# Patient Record
Sex: Male | Born: 1935
Health system: Southern US, Community
[De-identification: ages and names within clinical notes are randomized; demographics above are authoritative.]

## PROBLEM LIST (undated history)

## (undated) DIAGNOSIS — I639 Cerebral infarction, unspecified: Secondary | ICD-10-CM

## (undated) DIAGNOSIS — E7800 Pure hypercholesterolemia, unspecified: Secondary | ICD-10-CM

## (undated) DIAGNOSIS — M4800 Spinal stenosis, site unspecified: Secondary | ICD-10-CM

## (undated) DIAGNOSIS — R42 Dizziness and giddiness: Secondary | ICD-10-CM

## (undated) DIAGNOSIS — M1990 Unspecified osteoarthritis, unspecified site: Secondary | ICD-10-CM

## (undated) DIAGNOSIS — G459 Transient cerebral ischemic attack, unspecified: Secondary | ICD-10-CM

## (undated) DIAGNOSIS — N400 Enlarged prostate without lower urinary tract symptoms: Secondary | ICD-10-CM

## (undated) HISTORY — PX: KNEE SURGERY: SHX244

## (undated) HISTORY — DX: Dizziness and giddiness: R42

## (undated) HISTORY — DX: Pure hypercholesterolemia, unspecified: E78.00

## (undated) HISTORY — DX: Spinal stenosis, site unspecified: M48.00

## (undated) HISTORY — DX: Benign prostatic hyperplasia without lower urinary tract symptoms: N40.0

## (undated) HISTORY — PX: HERNIA REPAIR: SHX51

## (undated) HISTORY — DX: Cerebral infarction, unspecified: I63.9

## (undated) HISTORY — DX: Transient cerebral ischemic attack, unspecified: G45.9

## (undated) HISTORY — PX: SHOULDER SURGERY: SHX246

## (undated) HISTORY — DX: Unspecified osteoarthritis, unspecified site: M19.90

---

## 1998-10-23 ENCOUNTER — Emergency Department (HOSPITAL_COMMUNITY): Admission: EM | Admit: 1998-10-23 | Discharge: 1998-10-23 | Payer: Self-pay | Admitting: Emergency Medicine

## 2000-08-21 ENCOUNTER — Ambulatory Visit (HOSPITAL_COMMUNITY): Admission: RE | Admit: 2000-08-21 | Discharge: 2000-08-21 | Payer: Self-pay | Admitting: Gastroenterology

## 2002-06-01 ENCOUNTER — Encounter: Payer: Self-pay | Admitting: General Surgery

## 2002-06-01 ENCOUNTER — Ambulatory Visit (HOSPITAL_COMMUNITY): Admission: RE | Admit: 2002-06-01 | Discharge: 2002-06-01 | Payer: Self-pay | Admitting: General Surgery

## 2005-10-10 ENCOUNTER — Ambulatory Visit (HOSPITAL_COMMUNITY): Admission: RE | Admit: 2005-10-10 | Discharge: 2005-10-10 | Payer: Self-pay | Admitting: Orthopedic Surgery

## 2005-10-10 ENCOUNTER — Ambulatory Visit (HOSPITAL_BASED_OUTPATIENT_CLINIC_OR_DEPARTMENT_OTHER): Admission: RE | Admit: 2005-10-10 | Discharge: 2005-10-10 | Payer: Self-pay | Admitting: Orthopedic Surgery

## 2005-12-15 ENCOUNTER — Ambulatory Visit (HOSPITAL_COMMUNITY): Admission: RE | Admit: 2005-12-15 | Discharge: 2005-12-15 | Payer: Self-pay | Admitting: Chiropractic Medicine

## 2006-06-21 ENCOUNTER — Encounter: Admission: RE | Admit: 2006-06-21 | Discharge: 2006-06-21 | Payer: Self-pay | Admitting: Family Medicine

## 2006-10-22 ENCOUNTER — Encounter: Admission: RE | Admit: 2006-10-22 | Discharge: 2006-10-22 | Payer: Self-pay | Admitting: Surgery

## 2007-04-23 ENCOUNTER — Encounter (INDEPENDENT_AMBULATORY_CARE_PROVIDER_SITE_OTHER): Payer: Self-pay | Admitting: Gastroenterology

## 2007-04-23 ENCOUNTER — Ambulatory Visit (HOSPITAL_COMMUNITY): Admission: RE | Admit: 2007-04-23 | Discharge: 2007-04-23 | Payer: Self-pay | Admitting: Gastroenterology

## 2007-12-23 ENCOUNTER — Encounter: Admission: RE | Admit: 2007-12-23 | Discharge: 2007-12-23 | Payer: Self-pay | Admitting: Orthopedic Surgery

## 2007-12-26 ENCOUNTER — Ambulatory Visit: Payer: Self-pay | Admitting: Internal Medicine

## 2007-12-26 DIAGNOSIS — J180 Bronchopneumonia, unspecified organism: Secondary | ICD-10-CM | POA: Insufficient documentation

## 2007-12-26 DIAGNOSIS — Z87891 Personal history of nicotine dependence: Secondary | ICD-10-CM | POA: Insufficient documentation

## 2010-12-03 ENCOUNTER — Encounter: Payer: Self-pay | Admitting: Surgery

## 2010-12-14 NOTE — Assessment & Plan Note (Signed)
Summary: pna/pre-op clearance/abx: Avelox started   Visit Type:  Initial Consult Referred by:  Dr. Teressa Senter PCP:  Dr. Raquel Sarna  Chief Complaint:  Consult/follow-up pna/surgical clearance.  History of Present Illness: 64 y male.  On 12/21/2007 - developed cold that worsened by to worst by 12/24/2007. On 12/23/2007 was having routine pre-op cxr for hand surgery. This cxr reportedly showed "touch of pneumonia". So, on 12/24/2007 was noticed to be running "low temp" in Dr. Stark Jock office, was also having "worst cold". Apparently Dr. Teressa Senter heard "noise" in lung. Dr. Teressa Senter then called Dr. Marcelyn Bruins in pulmonary and was reportedly started avelox on 12/24/2007 evening. The next day, he says he felt "dramatically better" and since then progressively better. Today, went for 2 mile hike.   Currently, feels very good. HAs "residual" cold symptoms and "sinus tingling". Was debating whether to come to pulm office today. No fever. No hemoptysis. No cough. No edema. No sputum.      Updated Prior Medication List: AVELOX 400 MG  TABS (MOXIFLOXACIN HCL) 1 by mouth daily x 10 days  Current Allergies: No known allergies   Past Medical History:    Denies problmes    Cardiology workup for rt hands turning white duing mountain hiking - [employer]. Suspects is Raynaud's  Past Surgical History:    Knee surgery    Inguinal Hernia Repair 2003    Colonoscopy 2001 and 2008   Family History:    colon cancer in a sister  Social History:    Son Lives in [place]. Does lot of mountain hiking. Retired. Lives with spouse. Ex smoker. Occ etoh   Risk Factors:  Tobacco use:  quit    Year quit:  45 years ago    Pack-years:  7.5 pack    Comments:  1/2 pack for 15 years Alcohol use:  yes    Drinks per day:  0    Comments:  occassional   Review of Systems  The patient denies anorexia, fever, weight loss, vision loss, decreased hearing, hoarseness, chest pain, syncope, dyspnea on exhertion,  peripheral edema, prolonged cough, hemoptysis, abdominal pain, melena, hematochezia, severe indigestion/heartburn, hematuria, incontinence, genital sores, muscle weakness, suspicious skin lesions, transient blindness, difficulty walking, depression, unusual weight change, abnormal bleeding, enlarged lymph nodes, angioedema, breast masses, and testicular masses.         Healthy. At baseline rt hand turns white when he does mountain hiking.    Vital Signs:  Patient Profile:   77 Years Old Male Weight:      191.8 pounds O2 Sat:      97 % O2 treatment:    Room Air Temp:     97.6 degrees F oral Pulse rate:   71 / minute BP sitting:   110 / 80  (left arm)  Vitals Entered By: Michel Bickers CMA (December 26, 2007 9:21 AM)             Comments Pt states he doesn't think he needs to be here. Medications reviewed.     Physical Exam  General:     well developed, well nourished, in no acute distress Head:     normocephalic and atraumatic Eyes:     PERRLA/EOM intact; conjunctiva and sclera clear Ears:     TMs intact and clear with normal canals Nose:     no deformity, discharge, inflammation, or lesions Mouth:     no deformity or lesionsMelampatti Class II.   Neck:     no masses, thyromegaly, or  abnormal cervical nodes Chest Wall:     no deformities noted Lungs:     LL crackles +. Rest clear Heart:     regular rate and rhythm, S1, S2 without murmurs, rubs, gallops, or clicks Abdomen:     bowel sounds positive; abdomen soft and non-tender without masses, or organomegaly Msk:     no deformity or scoliosis noted with normal posture Pulses:     pulses normal Extremities:     no clubbing, cyanosis, edema, or deformity noted Neurologic:     CN II-XII grossly intact with normal reflexes, coordination, muscle strength and tone Skin:     intact without lesions or rashes Cervical Nodes:     no significant adenopathy Axillary Nodes:     no significant adenopathy Psych:     alert  and cooperative; normal mood and affect; normal attention span and concentration   CXR  Procedure date:  12/23/2007  Findings:      CXR 12/23/2007 - Personally reviewed -> LL ATx, Consolidation, obliteration of diaphragm   Clinical Data:  Pre-operative respiratory exam for orthopedic   surgery.  Some cough and congestion.   CHEST, TWO VIEWS:   Comparison:  06/21/06.   Findings:  The patient has density and volume loss in the left lower   lobe suggesting mild pneumonia.  No effusions.  Right lung is clear.    IMPRESSION:   Mild left base pneumonia.    Read By:  Thomasenia Sales,  M.D.   Released By:  Thomasenia Sales,  M.D.     Impression & Recommendations:  Problem # 1:  BRONCHOPNEUMONIA ORGANISM UNSPECIFIED (ICD-485) Assessment: New Ex smoker who is otherwise healthy. Developed symptoms of Community Acquired Pnemonia. CXR shows LL atlectasis. Exam has crackles on that side. Dramatically better on D3/10 Avelox. Feels near baseline. Given smoking hx will need to ensure resolution. Otherwise, suspect cancer.   PLAN Repeat CXR in 6 weeks to ensure clearance of pneumonia IF no clearance, need CT chest (esp in view of smoking history) Can have surgery for hand when clinically feeling better   His updated medication list for this problem includes:    Avelox 400 Mg Tabs (Moxifloxacin hcl) .Marland Kitchen... 1 by mouth daily x 10 days  Orders: Radiology Referral (Radiology) Consultation Level IV (16109)   Medications Added to Medication List This Visit: 1)  Avelox 400 Mg Tabs (Moxifloxacin hcl) .Marland Kitchen.. 1 by mouth daily x 10 days   Patient Instructions: 1)  1. Please complete 10 day course of avelox 2)  2. CXR 6 weeks 3)  3. Followup in 6 weeks after cxr 4)  4. You can have hand surgery after you are feeling well    ]

## 2011-03-27 NOTE — Op Note (Signed)
Brian Fitzpatrick, Brian Fitzpatrick                ACCOUNT NO.:  [id number]   MEDICAL RECORD NO.:  [id number]          PATIENT TYPE:  AMB   LOCATION:  ENDO                         FACILITY:  [facility]   PHYSICIAN:  Anselmo Rod, M.D.  DATE OF BIRTH:  05-Nov-1934   DATE OF PROCEDURE:  04/23/2007  DATE OF DISCHARGE:                               OPERATIVE REPORT   PROCEDURE PERFORMED:  Colonoscopy with snare polypectomy x1.   ENDOSCOPIST:  Anselmo Rod, M.D.   INSTRUMENT USED:  Pentax video colonoscope.   INDICATIONS FOR PROCEDURE:  A 77 year old white male with a family  history of colon cancer in a sister, undergoing screening colonoscopy to  rule out colonic polyps, masses, etc.   PREPROCEDURE PREPARATION:  Informed consent was procured from the  patient.  The patient was fasted for 8 hours prior to the procedure and  prepped with 32 Osmoprep pills the night prior to the procedure.  Risks  and benefits of the procedure including a 10% miss rate of cancer and  polyp were discussed with the patient as well.   PREPROCEDURE PHYSICAL:  VS:  The patient had stable vital signs.  NECK:  Supple.  CHEST:  Clear to auscultation.  HEART:  S1 and S2 regular.  ABDOMEN:  Soft with normal bowel sounds.   DESCRIPTION OF PROCEDURE:  The patient was placed in the left lateral  decubitus position and sedated with 75 mcg of Fentanyl and 7.5 mg of  Versed given intravenously in slow incremental doses.  Once the patient  was adequately sedated and maintained on low-flow oxygen and continuous  cardiac monitoring, the Pentax video colonoscope was advanced from the  rectum to the cecum.  A large amount of adherent stool was noted in the  right colon.  In spite of multiple washes, the entire colonic mucosa on  the right side could not be clearly visualized.  The cecal base was  visualized along with the appendiceal orifice, but in spite of multiple  washes the underlying mucosa could not be clearly seen and  inspected.  The terminal ileum appeared healthy and without lesions.  A small  sessile polyp was removed by hot snare from the proximal right colon.  There was no evidence of diverticulosis.  Prominent internal hemorrhoids  were seen on retroflexion.  The transverse colon and left colon appeared  normal.  The patient tolerated the procedure well without immediate  complications.   IMPRESSION:  1. Small internal hemorrhoids seen on retroflexion.  2. Small sessile polyp removed by hot snare from proximal right colon.  3. No evidence of diverticulosis.  4. Large amount of adherent stool in the right colon and cecum.      Complete visualization of this area was not possible.  5. Normal terminal ileum.   RECOMMENDATIONS:  1. Await pathology results.  2. Avoid all nonsteroidals including aspirin for the next two weeks.  3. Repeat colonoscopy within the next year or earlier depending on      when we can get an authorization from the patient's insurance      company.  4.  NuLYTELY prep will be used the next time as the Osmoprep was not      completely successful in cleaning the colon.  5. Outpatient follow-up as need arises in the future.      Anselmo Rod, M.D.  Electronically Signed     JNM/MEDQ  D:  04/23/2007  T:  04/23/2007  Job:  [id number]   cc:   Alexia Freestone, M.D.  Fax: [phone]

## 2011-03-30 NOTE — Op Note (Signed)
[facility]  Patient:    Brian Fitzpatrick, Brian Fitzpatrick Visit Number: [id number] MRN: [id number]          Service Type: DSU Location: DAY Attending Physician:  Delsa Bern Dictated by:   Lorne Skeens. Hoxworth, M.D. Proc. Date: 06/01/02 Admit Date:  06/01/2002 Discharge Date: 06/01/2002                             Operative Report  PREOPERATIVE DIAGNOSIS:  Right inguinal hernia.  POSTOPERATIVE DIAGNOSIS:  Right inguinal hernia.  PROCEDURE:  Laparoscopic repair of right inguinal hernia.  SURGEON:  Lorne Skeens. Hoxworth, M.D.  ANESTHESIA:  General.  BRIEF HISTORY:  Brian Fitzpatrick is a 77 year old male who presents with a several month history of episodic inguinal pain and on examination has a reducible right inguinal hernia. Options for repair including open and laparoscopic repair were discussed with the patient and he has elected to proceed with laparoscopic repair of his right inguinal hernia. The nature of the procedure, its indications, risks of bleeding, infection and recurrence were discussed and understood preoperatively. He is now brought to the operating room for this procedure.  DESCRIPTION OF PROCEDURE:  The patient brought to the operating room, placed in supine position on the operating table and general endotracheal anesthesia was induced. He received preoperative antibiotics. A Foley catheter was placed. The abdomen, groin and genitalia were sterilely prepped and draped. Local anesthesia was used to infiltrate the trocar sites. A 1 cm incision was made at the umbilicus and dissection carried down to the fascia.  A transverse incision was made at the edge of the right rectus muscle and the medial edge of the muscle identified, retracted laterally and preperitoneal space entered. The balloon dissector was placed with its tip in the space and then carefully passed along the midline to the pubic symphysis and inflated. There was very good  deployment of the balloon on the right and fair deployment of the balloon on the left. It was left in place for several minutes and deflated and the structural balloon trocar placed and CO2 pressure applied. There was excellent dissection of the right preperitoneal space. The pubic symphysis was identified and pubic tubercle cleared down to the iliac vessels which were carefully protected. Working laterally, there appeared to be a moderate sized direct defect. The peritoneum was identified laterally and had been well dissected off the anterior abdominal wall and was further dissected posteriorly. The peritoneal edge was followed back medially and there was a projection of peritoneum up along the cord structures into the internal ring which was dissected away from cord structures and stripped posteriorly. This confirmed that the peritoneum was dissected well posteriorly across and the cord structures well skeletonized. The epigastric vessels were identified and carefully protected. After complete dissection, a large right sided bard 3D mesh was used and was placed in the preperitoneal space unfurled and oriented. It was tacked initially to the pubic symphysis and then along the Coopers ligament down to but avoiding the iliac vessels. It was then tacked laterally near the anterosuperior iliac spine placing all tacks so they could be felt through the anterior abdominal wall working lateral to medially avoiding the epigastric vessels. It appeared to provide nice broad coverage of the direct and indirect spaces. Holding the posterolateral corner in place under direct vision, all CO2 was evacuated and trocars removed. The fascial defect at the umbilicus was closed with a figure-of-eight suture of #0 Vicryl.  The skin incisions were closed with interrupted subcuticular 4-0 Monocryl and Steri-Strips. Sponge, needle and instrument counts were correct. A dry sterile dressing was applied, the patient  taken to the recovery room in good condition. Dictated by:   Lorne Skeens. Hoxworth, M.D. Attending Physician:  Delsa Bern DD:  06/01/02 TD:  06/05/02 Job: [id number]

## 2011-03-30 NOTE — Procedures (Signed)
[facility]  Patient:    Brian Fitzpatrick, Brian Fitzpatrick                       MRN: [id number] Proc. Date: 08/21/00 Adm. Date:  [id number] Disc. Date: [id number] Attending:  Charna [name] CC:         Reuben Likes, M.D.   Procedure Report  DATE OF BIRTH:  [birth year]  PROCEDURE:  Colonoscopy.  ENDOSCOPIST:  Anselmo Rod, M.D.  INSTRUMENTS USED:  Olympus video colonoscope.  INDICATIONS:  Guaiac positive stools in a 77 year old white male.  Rule out colonic polyps, masses, hemorrhoids, etc.  PREPROCEDURE PREPARATION:  Informed consent was procured from the patient. The patient was fasted for eight hours prior to the procedure and prepped with a bottle of magnesium citrate and a gallon of NuLytely the night prior to the procedure.  PREPROCEDURE PHYSICAL EXAMINATION:  VITAL SIGNS:  The patient had stable vital signs.  NECK:  Supple.  CHEST:  Clear to auscultation, S1, S2 regular.  ABDOMEN:  Soft with normal abdominal bowel sounds.  DESCRIPTION OF PROCEDURE:  The patient was placed in the left lateral decubitus position and sedated with 50 mg of Demerol and 6 mg of Versed intravenously.  Once the patient was adequately sedated and maintained on low-flow oxygen and continuous cardiac monitoring, the Olympus video colonoscope was advanced from the rectum to the cecum without difficulty. Except for a small internal hemorrhoid, no other abnormalities were seen.  The entire colonic mucosa appeared healthy.  There was no evidence of masses, polyps, erosions, or ulcerations.  IMPRESSION:  Normal colonoscopy except for one small internal hemorrhoid.  RECOMMENDATIONS:  Repeat guaiac testing will be done on an outpatient basis and further recommendations made as needed. DD:  08/21/00 TD:  08/22/00 Job: [id number]

## 2011-03-30 NOTE — Op Note (Signed)
Brian Fitzpatrick, Brian Fitzpatrick                ACCOUNT NO.:  [id number]   MEDICAL RECORD NO.:  [id number]          PATIENT TYPE:  AMB   LOCATION:  DSC                          FACILITY:  [facility]   PHYSICIAN:  Loreta Ave, M.D. DATE OF BIRTH:  [birth year]   DATE OF PROCEDURE:  10/10/2005  DATE OF DISCHARGE:                                 OPERATIVE REPORT   PREOPERATIVE DIAGNOSIS:  Right knee medial meniscus tear, lateral meniscus  tear.   POSTOPERATIVE DIAGNOSIS:  Right knee medial meniscus tear, lateral meniscus  tear, grade 2 chondromalacia of the patella, medial femoral condyle, and  lateral tibial plateau.   PROCEDURE:  Right knee exam under anesthesia, arthroscopy, debridement of  medial and lateral meniscus with superficial chondroplasty of all three  compartments.   SURGEON:  Loreta Ave, M.D.   ASSISTANT:  Genene Churn. Owens, P.A.-C.   ANESTHESIA:  Knee block with sedation.   SPECIMENS:  None.   CULTURES:  None.   COMPLICATIONS:  None.   DRESSINGS:  Soft compressive.   PROCEDURE:  The patient was brought to the operating room and after adequate  anesthesia had been obtained, the right knee was examined.  Full motion,  good stability.  Negative Lachman, negative drawer.  No patellofemoral  instability.  Tourniquet and leg holder applied.  The leg was prepped and  draped in the usual sterile fashion.  Three portals were created, one  superolateral and one each medial and lateral parapatellar.  Inflow catheter  introduced, knee distended, arthroscope introduced, knee inspected.  Some  grade 2 changes at the peak of the patella debrided.  The trochlea looked  good,  no instability.  A lot of synovitis and hypertrophic fat pad, all  debrided.  ACL some degeneration but still functional and intact with good  end point with Lachman and drawer.  PCL intact.  Medial compartment grade 2  changes on the condyle.  Medial meniscus initially appeared to be intact but  it turned out  to be completely detached at the posterior horn with a  displaceable posterior third of the meniscus.  This was removed over the  posterior third and tapered into remaining meniscus leaving a little bit of  room in the back and salvaging the anterior 2/3.  What was left was stable  and intact.  Laterally, flap tears under surface of the  posterior third and  flap tears off the top of the anterior third both debrided.  At completion,  still a fair amount of meniscus remaining.  Grade 2 changes of the lateral  plateau, grade 2 changes medial femoral condyle debrided.  At completion,  all recess  examined to be sure all loose fragments were removed.  The instruments and  fluid were removed.  The portals of the knee were injected with Marcaine.  The portals were closed with 4-0 nylon.  Sterile compressive dressing  applied.  Anesthesia reversed.  Brought to the recovery room.  Tolerated the  surgery well without complications.      Loreta Ave, M.D.  Electronically Signed     DFM/MEDQ  D:  10/10/2005  T:  10/10/2005  Job:  [id number]

## 2011-10-19 ENCOUNTER — Encounter: Payer: Self-pay | Admitting: *Deleted

## 2011-10-19 ENCOUNTER — Encounter: Payer: Self-pay | Admitting: Cardiology

## 2011-10-22 ENCOUNTER — Ambulatory Visit (INDEPENDENT_AMBULATORY_CARE_PROVIDER_SITE_OTHER): Payer: Medicare Other | Admitting: Cardiology

## 2011-10-22 ENCOUNTER — Encounter: Payer: Self-pay | Admitting: Cardiology

## 2011-10-22 DIAGNOSIS — R55 Syncope and collapse: Secondary | ICD-10-CM | POA: Insufficient documentation

## 2011-10-22 DIAGNOSIS — R079 Chest pain, unspecified: Secondary | ICD-10-CM | POA: Insufficient documentation

## 2011-10-22 DIAGNOSIS — G459 Transient cerebral ischemic attack, unspecified: Secondary | ICD-10-CM

## 2011-10-22 NOTE — Progress Notes (Signed)
HPI: 77 yo male with no prior cardiac history for evaluation of dizziness and near syncope. Patient has no prior cardiac history. He typically does not have dyspnea on exertion, orthopnea, PND, pedal edema, palpitations or exertional chest pain. Over the past 5 months he has had occasional chest pain. The pain is in the left upper chest area and described as an ache. It does not radiate. It is not exertional, pleuritic or positional. No associated symptoms. It lasts 15 minutes and resolved spontaneously. Patient feels as though "I pulled a muscle". He also has had 2 "spells" that he is concerned about. The first occurred in late September when he was in [place] visiting family. He leaned over a crib to put his granddaughter in. When he rose he developed dizziness and generalized weakness for approximately 1 minute. No chest pain, palpitations or dyspnea. He then had an episode in [employer] 2 weeks ago when he developed sudden onset of visual disturbance and falling to the right. He denies dysarthria, loss of strength or sensation in his extremities, incontinence, chest pain or palpitations. The acute event lasted for 3 minutes but then he felt generalized weakness for 2 hours. He otherwise has had mild dizziness with standing but no syncope. He has not had any dizziness with turning his head.  No current outpatient prescriptions on file.    Not on File  Past Medical History  Diagnosis Date  . Diabetes mellitus     Borderline  . BPH (benign prostatic hyperplasia)     Past Surgical History  Procedure Date  . Knee surgery   . Hernia repair   . Shoulder surgery     History   Social History  . Marital Status: Married    Spouse Name: [facility]    Number of Children: 3  . Years of Education: N/A   Occupational History  .      Retired   Social History Main Topics  . Smoking status: Former Smoker    Types: Cigarettes  . Smokeless tobacco: Not on file   Comment: 45 YRS AGO.  Marland Kitchen Alcohol  Use: Yes     Occasional  . Drug Use: Not on file  . Sexually Active: Not on file   Other Topics Concern  . Not on file   Social History Narrative  . No narrative on file    Family History  Problem Relation Age of Onset  . Colon cancer      ROS: no fevers or chills, productive cough, hemoptysis, dysphasia, odynophagia, melena, hematochezia, dysuria, hematuria, rash, seizure activity, orthopnea, PND, pedal edema, claudication. Remaining systems are negative.  Physical Exam:   Blood pressure 144/82, pulse 61, resp. rate 18, height 5\' 8"  (1.727 m), weight 188 lb (85.276 kg).  General:  Well developed/well nourished in NAD Skin warm/dry Patient not depressed No peripheral clubbing Back-normal HEENT-normal/normal eyelids Neck supple/normal carotid upstroke bilaterally; no bruits; no JVD; no thyromegaly chest - CTA/ normal expansion CV - RRR/normal S1 and S2; no murmurs, rubs or gallops;  PMI nondisplaced Abdomen -NT/ND, no HSM, no mass, + bowel sounds, no bruit 2+ femoral pulses, no bruits Ext-no edema, chords, 2+ DP Neuro-grossly nonfocal  ECG NSR, first degree AV block

## 2011-10-22 NOTE — Patient Instructions (Signed)
Your physician wants you to follow-up in: 4 to 6 weeks after you see neurology You will receive a reminder letter in the mail two months in advance. If you don't receive a letter, please call our office to schedule the follow-up appointment.  Your physician has requested that you have a stress echocardiogram. For further information please visit [email or web address] Please follow instruction sheet as given.  Your physician has requested that you have a carotid duplex. This test is an ultrasound of the carotid arteries in your neck. It looks at blood flow through these arteries that supply the brain with blood. Allow one hour for this exam. There are no restrictions or special instructions.  Needs Appointment with Neurology for TIAs

## 2011-10-22 NOTE — Assessment & Plan Note (Signed)
Symptoms are atypical. Schedule stress echocardiogram both to exclude ischemia and quantify LV function particularly with near syncopal episodes.

## 2011-10-22 NOTE — Assessment & Plan Note (Signed)
Etiology of patient's symptoms is unclear. He sounds to have mild orthostatic symptoms at times. However the description of his event in [employer] sounds potentially to be neurologically mediated. He had transient visual disturbance followed by falling to the right. I will arrange carotid Dopplers and a neurology consult. We can consider a monitor in the future if he has recurrent events. However his symptoms do not sound arrhythmia mediated.

## 2011-10-30 ENCOUNTER — Encounter: Payer: Self-pay | Admitting: Neurology

## 2011-10-30 ENCOUNTER — Ambulatory Visit (INDEPENDENT_AMBULATORY_CARE_PROVIDER_SITE_OTHER): Payer: Medicare Other | Admitting: Neurology

## 2011-10-30 VITALS — BP 130/76 | HR 80 | Wt 189.0 lb

## 2011-10-30 DIAGNOSIS — R569 Unspecified convulsions: Secondary | ICD-10-CM

## 2011-10-30 DIAGNOSIS — IMO0001 Reserved for inherently not codable concepts without codable children: Secondary | ICD-10-CM

## 2011-10-30 NOTE — Progress Notes (Signed)
Dear Dr. Jens Som,  Thank you for having me see Brian Fitzpatrick in consultation today at [facility] for his problem with transient spell of imbalance and swerving to the right, with "difficulty focusing".  As you may recall, he is a 77 y.o. year old male with a history of borderline diabetes, who had a spell of concern about 1 month ago when he was in [employer].  He noticed the sudden onset of feeling like he was being pulled to the right, to an extent he ran into a table of clothes.  He had some blurriness of vision as well.  No dysphagia or dysarthria.  No clear sensory changes.  He felt the sensation only of being pulled to the right only lasted 5 minutes or so, but then felt weak for about 30 minutes afterwards in a non-focal way.  He has never had a similar event.  He did not feel faint.  He had another event about 3 months ago when he bent over to pick up his grandchild and on writing himself felt dizzy and had "difficulty focusing".   He has had other of these spells as well in the past.    Past Medical History  Diagnosis Date  . Diabetes mellitus     Borderline  . BPH (benign prostatic hyperplasia)     Past Surgical History  Procedure Date  . Knee surgery   . Hernia repair   . Shoulder surgery     History   Social History  . Marital Status: Married    Spouse Name: [facility]    Number of Children: 3  . Years of Education: N/A   Occupational History  .      Retired   Social History Main Topics  . Smoking status: Former Smoker    Types: Cigarettes  . Smokeless tobacco: Never Used   Comment: 45 YRS AGO.  Marland Kitchen Alcohol Use: Yes     Occasional  . Drug Use: None  . Sexually Active: None   Other Topics Concern  . None   Social History Narrative  . None    Family History  Problem Relation Age of Onset  . Colon cancer      No current outpatient prescriptions on file prior to visit.    No Known Allergies    ROS:  13 systems were reviewed and are notable  for chronic lower back pain with pain radiating around to his left hip.  All other review of systems are unremarkable.   Examination:  Filed Vitals:   10/30/11 0940  BP: 130/76  Pulse: 80  Weight: 189 lb (85.73 kg)     In general, well appearing man in NAD.  Cardiovascular: The patient has a regular rate and rhythm and no carotid bruits.  Fundoscopy:  Disks are flat. Vessel caliber within normal limits.  Mental status:   The patient is oriented to person, place and time. Recent and remote memory are intact. Attention span and concentration are normal. Language including repetition, naming, following commands are intact. Fund of knowledge of current and historical events, as well as vocabulary are normal.  Cranial Nerves: Pupils are equally round and reactive to light. Visual fields full to confrontation. Extraocular movements are intact without nystagmus. Facial sensation and muscles of mastication are intact. Muscles of facial expression are symmetric. Hearing intact to bilateral finger rub. Tongue protrusion, uvula, palate midline.  Shoulder shrug intact  Motor:  The patient has normal bulk and tone, no pronator drift.  There are no adventitious movements.  5/5 bilaterally.  Reflexes:   Biceps  Triceps Brachioradialis Knee Ankle  Right 2+  2+  2+   2+ 2+  Left  2+  2+  2+   2+ 2+  Toes down  Coordination:  Normal finger to nose.    Sensation is slightly decreased to temperature, but vibration and position intact.  Gait and Station are normal.  Tandem gait is intact.  Romberg is negative   Impression/Recs:  The multiple spells of dizziness seem secondary to position changes, and I think are likely benign phenomenon.  The one spell of feeling like he was walking to the right is more of a concern, although I am not convinced it is a TIA.  Likely if it was it would localize on history to the right cerebellum.  I agree with getting an echocardiogram.  I recommend getting an  MRI brain and MRA head and neck to look for signs of old infarcts as well as intervenable vascular lesions.  I also recommend he start aspirin 325mg  instead of 81mg  as all the stroke prevention trials were done with this dose, with full recognition that full platelet inhibition may be achieved with lower doses based on basic science studies.  The patient is going to call me if he agrees to the MRI brain and MRA head and neck.  A more conservative approach would be to get the MRI brain and a carotid U/S.   I will call the patient with the results if he chooses to proceed.  Thank you for having [place] see Brian Fitzpatrick in consultation.  Feel free to contact me with any questions.  Lupita Raider Modesto Charon, MD [facility], [place] [address] [place], [place] [address] Phone: [phone] Fax: [phone].

## 2011-10-30 NOTE — Patient Instructions (Signed)
Give [place] a call when you decide about the MRI

## 2011-10-31 ENCOUNTER — Other Ambulatory Visit: Payer: Self-pay

## 2011-10-31 DIAGNOSIS — G459 Transient cerebral ischemic attack, unspecified: Secondary | ICD-10-CM

## 2011-11-12 ENCOUNTER — Other Ambulatory Visit (INDEPENDENT_AMBULATORY_CARE_PROVIDER_SITE_OTHER): Payer: Medicare Other

## 2011-11-12 DIAGNOSIS — G459 Transient cerebral ischemic attack, unspecified: Secondary | ICD-10-CM

## 2011-11-12 LAB — BASIC METABOLIC PANEL
BUN: 20 mg/dL (ref 6–23)
CO2: 29 mEq/L (ref 19–32)
Calcium: 9.9 mg/dL (ref 8.4–10.5)
Chloride: 102 mEq/L (ref 96–112)
Creatinine, Ser: 1.2 mg/dL (ref 0.4–1.5)
GFR: 65.72 mL/min (ref >60.00)
Glucose, Bld: 109 mg/dL — ABNORMAL HIGH (ref 70–99)
Potassium: 4.6 mEq/L (ref 3.5–5.1)
Sodium: 139 mEq/L (ref 135–145)

## 2011-11-15 ENCOUNTER — Other Ambulatory Visit (HOSPITAL_COMMUNITY): Payer: Medicare Other

## 2011-11-15 ENCOUNTER — Ambulatory Visit (HOSPITAL_COMMUNITY)
Admission: RE | Admit: 2011-11-15 | Discharge: 2011-11-15 | Disposition: A | Discharge status: Home / Self Care | Payer: Medicare Other | Source: Ambulatory Visit | Attending: Neurology | Admitting: Neurology

## 2011-11-15 DIAGNOSIS — G319 Degenerative disease of nervous system, unspecified: Secondary | ICD-10-CM | POA: Insufficient documentation

## 2011-11-15 DIAGNOSIS — R269 Unspecified abnormalities of gait and mobility: Secondary | ICD-10-CM | POA: Insufficient documentation

## 2011-11-15 DIAGNOSIS — F29 Unspecified psychosis not due to a substance or known physiological condition: Secondary | ICD-10-CM | POA: Insufficient documentation

## 2011-11-15 DIAGNOSIS — R5381 Other malaise: Secondary | ICD-10-CM | POA: Insufficient documentation

## 2011-11-15 DIAGNOSIS — R42 Dizziness and giddiness: Secondary | ICD-10-CM | POA: Insufficient documentation

## 2011-11-15 DIAGNOSIS — J3489 Other specified disorders of nose and nasal sinuses: Secondary | ICD-10-CM | POA: Insufficient documentation

## 2011-11-15 DIAGNOSIS — R5383 Other fatigue: Secondary | ICD-10-CM | POA: Insufficient documentation

## 2011-11-15 DIAGNOSIS — I7389 Other specified peripheral vascular diseases: Secondary | ICD-10-CM | POA: Insufficient documentation

## 2011-11-15 DIAGNOSIS — G459 Transient cerebral ischemic attack, unspecified: Secondary | ICD-10-CM | POA: Insufficient documentation

## 2011-11-15 MED ORDER — GADOBENATE DIMEGLUMINE 529 MG/ML IV SOLN
17.0000 mL | Freq: Once | INTRAVENOUS | Status: AC | PRN
  Administered 2011-11-15: 17 mL via INTRAVENOUS

## 2011-11-16 ENCOUNTER — Telehealth: Payer: Self-pay | Admitting: Neurology

## 2011-11-16 NOTE — Telephone Encounter (Signed)
Message copied by Benay Spice on Fri Nov 16, 2011 10:13 AM ------      Message from: Denton Meek H      Created: Thu Nov 15, 2011 10:31 PM       Please let Brian Fitzpatrick know that his blood vessels and brain looked normal for his age.  Nothing to indicate that the spell of weaving to the right was from a TIA.  He should stay on the aspirin though as we previously discussed.  If he has more questions I am happy to talk to him directly.

## 2011-11-16 NOTE — Telephone Encounter (Signed)
Called and spoke with the patient. Information given as directed by Dr. Modesto Charon. The patient states he still doesn't know what caused the "spell" and reports that he has a f/u appointment with Dr. Jens Som. I asked if he would like to speak with Dr. Modesto Charon directly but the patient said no. I told him to continue the oral ASA as discussed at his office visit and to call us if he had any concerns/issues. The patient states he would.

## 2011-11-29 ENCOUNTER — Other Ambulatory Visit: Payer: Self-pay

## 2011-11-29 ENCOUNTER — Emergency Department (HOSPITAL_COMMUNITY)
Admission: EM | Admit: 2011-11-29 | Discharge: 2011-11-29 | Disposition: A | Discharge status: Home / Self Care | Payer: Medicare Other | Attending: Emergency Medicine | Admitting: Emergency Medicine

## 2011-11-29 ENCOUNTER — Encounter (HOSPITAL_COMMUNITY): Payer: Self-pay

## 2011-11-29 DIAGNOSIS — R42 Dizziness and giddiness: Secondary | ICD-10-CM | POA: Insufficient documentation

## 2011-11-29 DIAGNOSIS — Z7982 Long term (current) use of aspirin: Secondary | ICD-10-CM | POA: Insufficient documentation

## 2011-11-29 DIAGNOSIS — E119 Type 2 diabetes mellitus without complications: Secondary | ICD-10-CM | POA: Insufficient documentation

## 2011-11-29 LAB — DIFFERENTIAL
Basophils Absolute: 0.1 10*3/uL (ref 0.0–0.1)
Basophils Relative: 1 % (ref 0–1)
Eosinophils Absolute: 0.1 10*3/uL (ref 0.0–0.7)
Eosinophils Relative: 2 % (ref 0–5)
Lymphocytes Relative: 18 % (ref 12–46)
Lymphs Abs: 1 10*3/uL (ref 0.7–4.0)
Monocytes Absolute: 0.4 10*3/uL (ref 0.1–1.0)
Monocytes Relative: 7 % (ref 3–12)
Neutro Abs: 3.9 10*3/uL (ref 1.7–7.7)
Neutrophils Relative %: 72 % (ref 43–77)

## 2011-11-29 LAB — CBC
HCT: 45.4 % (ref 39.0–52.0)
Hemoglobin: 15.5 g/dL (ref 13.0–17.0)
MCH: 31.6 pg (ref 26.0–34.0)
MCHC: 34.1 g/dL (ref 30.0–36.0)
MCV: 92.5 fL (ref 78.0–100.0)
Platelets: 233 10*3/uL (ref 150–400)
RBC: 4.91 MIL/uL (ref 4.22–5.81)
RDW: 12.7 % (ref 11.5–15.5)
WBC: 5.4 10*3/uL (ref 4.0–10.5)

## 2011-11-29 LAB — COMPREHENSIVE METABOLIC PANEL
ALT: 18 U/L (ref 0–53)
AST: 19 U/L (ref 0–37)
Albumin: 3.9 g/dL (ref 3.5–5.2)
Alkaline Phosphatase: 62 U/L (ref 39–117)
BUN: 16 mg/dL (ref 6–23)
CO2: 28 mEq/L (ref 19–32)
Calcium: 10.4 mg/dL (ref 8.4–10.5)
Chloride: 103 mEq/L (ref 96–112)
Creatinine, Ser: 1.06 mg/dL (ref 0.50–1.35)
GFR calc Af Amer: 77 mL/min — ABNORMAL LOW (ref >90)
GFR calc non Af Amer: 66 mL/min — ABNORMAL LOW (ref >90)
Glucose, Bld: 128 mg/dL — ABNORMAL HIGH (ref 70–99)
Potassium: 4.3 mEq/L (ref 3.5–5.1)
Sodium: 140 mEq/L (ref 135–145)
Total Bilirubin: 0.8 mg/dL (ref 0.3–1.2)
Total Protein: 7.2 g/dL (ref 6.0–8.3)

## 2011-11-29 LAB — TROPONIN I: Troponin I: <0.3 ng/mL (ref <0.30)

## 2011-11-29 MED ORDER — MECLIZINE HCL 25 MG PO TABS
25.0000 mg | ORAL_TABLET | Freq: Once | ORAL | Status: AC
  Administered 2011-11-29: 25 mg via ORAL
  Filled 2011-11-29: qty 1

## 2011-11-29 MED ORDER — MECLIZINE HCL 25 MG PO TABS: 25.0000 mg | ORAL_TABLET | Freq: Three times a day (TID) | ORAL | Status: AC | PRN

## 2011-11-29 NOTE — ED Provider Notes (Signed)
History     CSN: [id number]  Arrival date & time 11/29/11  0801   First MD Initiated Contact with Patient 11/29/11 [phone]      Chief Complaint  Patient presents with  . Dizziness    (Consider location/radiation/quality/duration/timing/severity/associated sxs/prior treatment) HPI Comments: For the past 6 months, patient has described episodes of dizziness, dysequilibrium.  He has had about 8 total spells for which he has been seen by his pcp, cardiology, and neurology.  Has had an MRI/MRA 2 weeks ago that showed normal brain and carotids.  He reports he is "pre-diabetic", but sugars have always been okay during these episodes.  Symptoms are worse with movement, better with rest.  He denies any visual complaints and denies hearing loss or ringing in the ears.  No fevers of chills.  The history is provided by the patient.    Past Medical History  Diagnosis Date  . Diabetes mellitus     Borderline  . BPH (benign prostatic hyperplasia)     Past Surgical History  Procedure Date  . Knee surgery   . Hernia repair   . Shoulder surgery     Family History  Problem Relation Age of Onset  . Colon cancer      History  Substance Use Topics  . Smoking status: Former Smoker    Types: Cigarettes  . Smokeless tobacco: Never Used   Comment: 45 YRS AGO.  Marland Kitchen Alcohol Use: Yes     Occasional      Review of Systems  All other systems reviewed and are negative.    Allergies  Review of patient's allergies indicates no known allergies.  Home Medications   Current Outpatient Rx  Name Route Sig Dispense Refill  . ASPIRIN 81 MG PO TABS Oral Take 81 mg by mouth. Takes about every other day.       BP 178/93  Pulse 57  Temp(Src) 97.8 F (36.6 C) (Oral)  Resp 16  SpO2 98%  Physical Exam  Nursing note and vitals reviewed. Constitutional: He is oriented to person, place, and time. He appears well-developed and well-nourished. No distress.  HENT:  Head: Normocephalic and  atraumatic.  Right Ear: External ear normal.  Left Ear: External ear normal.  Mouth/Throat: Oropharynx is clear and moist.  Eyes: EOM are normal. Pupils are equal, round, and reactive to light.  Neck: Normal range of motion. Neck supple.  Cardiovascular: Normal rate and regular rhythm.  Exam reveals no friction rub.   No murmur heard. Pulmonary/Chest: Effort normal and breath sounds normal. No respiratory distress. He has no wheezes.  Abdominal: Soft. Bowel sounds are normal. He exhibits no distension. There is no tenderness.  Musculoskeletal: Normal range of motion.  Neurological: He is alert and oriented to person, place, and time. He has normal reflexes. No cranial nerve deficit. He exhibits normal muscle tone. Coordination normal.  Skin: Skin is warm and dry. He is not diaphoretic.    ED Course  Procedures (including critical care time)   Labs Reviewed  CBC  DIFFERENTIAL  COMPREHENSIVE METABOLIC PANEL  TROPONIN I   No results found.   No diagnosis found.   Date: 11/29/2011  Rate: 56  Rhythm: sinus bradycardia  QRS Axis: normal  Intervals: normal  ST/T Wave abnormalities: normal  Conduction Disutrbances:none  Narrative Interpretation:   Old EKG Reviewed: unchanged    MDM  Labs and ekg are normal.  I reviewed the results of the MRI/MRA from 2 weeks ago and they were unremarkable.  The patient is feeling fine now.  I will discharge him with a prescription for meclizine as I suspect his symptoms are related to vertigo.  He should follow up with his pcp if not improving.        Geoffery Lyons, MD 11/29/11 1049

## 2011-11-29 NOTE — ED Notes (Signed)
Bathroom with family member

## 2011-11-29 NOTE — ED Notes (Signed)
Pt with c/o dizzines starting at 5am, dizziness for the last 6 months, neurology says maybe TIAs,

## 2011-12-06 ENCOUNTER — Other Ambulatory Visit (HOSPITAL_COMMUNITY): Payer: Medicare Other | Admitting: Radiology

## 2011-12-06 ENCOUNTER — Encounter: Payer: Medicare Other | Admitting: Cardiology

## 2011-12-07 ENCOUNTER — Encounter: Payer: Medicare Other | Admitting: *Deleted

## 2011-12-13 ENCOUNTER — Ambulatory Visit: Payer: Medicare Other | Admitting: Cardiology

## 2012-04-08 ENCOUNTER — Encounter: Payer: Self-pay | Admitting: Gastroenterology

## 2012-05-27 ENCOUNTER — Telehealth: Payer: Self-pay | Admitting: *Deleted

## 2012-05-27 ENCOUNTER — Encounter: Payer: Self-pay | Admitting: Gastroenterology

## 2012-05-27 ENCOUNTER — Ambulatory Visit (AMBULATORY_SURGERY_CENTER): Payer: Medicare Other | Admitting: *Deleted

## 2012-05-27 VITALS — Ht 69.0 in | Wt 182.0 lb

## 2012-05-27 DIAGNOSIS — Z1211 Encounter for screening for malignant neoplasm of colon: Secondary | ICD-10-CM

## 2012-05-27 MED ORDER — MOVIPREP 100 G PO SOLR: ORAL | Status: DC

## 2012-05-27 NOTE — Telephone Encounter (Addendum)
Last colon about 7 years ago at Dr. Loreta Ave  He thinks he had a polyp Says he had Dr. Kenna Gilbert office send the records already. I have had pt sign a medical release so we can request these records.  Medical release given to [facility]          [facility] Size      Small Medium Large Extra Extra Large             Kathreen Cornfield  Description:  78 year old male  05/27/2012 9:49 AM Telephone Provider:  Wyona Almas, RN  MRN: [id number] Department:  [facility]            Reason for Call     Medical Release           Call Documentation     Wyona Almas, RN 05/27/2012 9:51 AM Signed  Last colon about 7 years ago at Dr. Loreta Ave He thinks he had a polyp Says he had Dr. Kenna Gilbert office send the records already. I have had pt sign a medical release so we can request these records. Medical release given to [facility]            Encounter MyChart Messages     No messages in this encounter         Routing History        From  To  Priority    05/27/2012 9:51 AM  [name], RN  [name], CMA  Routine       Created by     [name], RN on 05/27/2012 9:49 AM                           Visit Pharmacy     [id number] - [place], [place] - [address]         Contacts         Type  Contact  Phone    05/27/2012 9:49 AM  Phone (Outgoing)  [name] CMA

## 2012-05-27 NOTE — Telephone Encounter (Signed)
Medical release given to Robin for records from Dr. Loreta Ave.

## 2012-05-27 NOTE — Progress Notes (Addendum)
Last colon about 7 years ago at Dr. Loreta Ave  He thinks he had a polyp Says he had Dr. Kenna Gilbert office send the records already. I have had pt sign a medical release so we can request these records.  Medical release given to [facility] CMA  Pt. Mentioned he'd seen Dr. Arlyce Dice 20-25 years ago and I had Yesi/ Schedular  Check for me and confirm that he was a pt of Dr. Arlyce Dice.  Lady Gary is going to contact Mr. Antony Blackbird and [facility] him with Arlyce Dice.

## 2012-05-27 NOTE — Telephone Encounter (Signed)
         [facility] Size      Small Medium Large Extra Extra Large             Brian Fitzpatrick  Description:  78 year old male  05/27/2012 9:49 AM Telephone Provider:  Wyona Almas, RN  MRN: [id number] Department:  [facility]            Reason for Call     Medical Release           Call Documentation     Wyona Almas, RN 05/27/2012 9:51 AM Signed  Last colon about 7 years ago at Dr. Loreta Ave He thinks he had a polyp Says he had Dr. Kenna Gilbert office send the records already. I have had pt sign a medical release so we can request these records. Medical release given to [facility]            Encounter MyChart Messages     No messages in this encounter         Routing History        From  To  Priority    05/27/2012 9:51 AM  [name], RN  [name], CMA  Routine       Created by     [name], RN on 05/27/2012 9:49 AM                           Visit Pharmacy     [id number] - [place], [place] - [address]         Contacts         Type  Contact  Phone    05/27/2012 9:49 AM  Phone (Outgoing)  [name] CMA

## 2012-05-29 NOTE — Telephone Encounter (Signed)
Faxed [facility] to Dr Trilby Drummer office this morning

## 2012-05-29 NOTE — Telephone Encounter (Signed)
RECORDS ON DR Nita Sells DESK TO REVIEW

## 2012-05-30 ENCOUNTER — Encounter: Payer: Self-pay | Admitting: Gastroenterology

## 2012-06-10 ENCOUNTER — Encounter: Payer: Medicare Other | Admitting: Gastroenterology

## 2012-06-10 ENCOUNTER — Telehealth: Payer: Self-pay | Admitting: *Deleted

## 2012-06-10 NOTE — Telephone Encounter (Signed)
Called Brian Fitzpatrick to inform that per Dr Arlyce Dice he is to continue with his colonoscopy. Called patient to inform but the patient said he has cancelled his procedure. Will not reschedule at this time

## 2012-06-12 ENCOUNTER — Encounter: Payer: Medicare Other | Admitting: Gastroenterology

## 2012-07-10 ENCOUNTER — Encounter: Payer: Medicare Other | Admitting: Gastroenterology

## 2014-08-18 ENCOUNTER — Observation Stay (HOSPITAL_COMMUNITY)
Admission: EM | Admit: 2014-08-18 | Discharge: 2014-08-19 | Disposition: A | Discharge status: Home / Self Care | Payer: Medicare HMO | Attending: Internal Medicine | Admitting: Internal Medicine

## 2014-08-18 ENCOUNTER — Emergency Department (HOSPITAL_COMMUNITY): Payer: Medicare HMO

## 2014-08-18 ENCOUNTER — Encounter (HOSPITAL_COMMUNITY): Payer: Self-pay | Admitting: Emergency Medicine

## 2014-08-18 DIAGNOSIS — N400 Enlarged prostate without lower urinary tract symptoms: Secondary | ICD-10-CM | POA: Insufficient documentation

## 2014-08-18 DIAGNOSIS — E785 Hyperlipidemia, unspecified: Secondary | ICD-10-CM | POA: Insufficient documentation

## 2014-08-18 DIAGNOSIS — E1169 Type 2 diabetes mellitus with other specified complication: Secondary | ICD-10-CM | POA: Diagnosis present

## 2014-08-18 DIAGNOSIS — R079 Chest pain, unspecified: Secondary | ICD-10-CM

## 2014-08-18 DIAGNOSIS — R0789 Other chest pain: Secondary | ICD-10-CM | POA: Diagnosis not present

## 2014-08-18 DIAGNOSIS — Z800 Family history of malignant neoplasm of digestive organs: Secondary | ICD-10-CM | POA: Diagnosis not present

## 2014-08-18 DIAGNOSIS — R7301 Impaired fasting glucose: Secondary | ICD-10-CM | POA: Diagnosis not present

## 2014-08-18 DIAGNOSIS — Z87891 Personal history of nicotine dependence: Secondary | ICD-10-CM | POA: Insufficient documentation

## 2014-08-18 DIAGNOSIS — M1990 Unspecified osteoarthritis, unspecified site: Secondary | ICD-10-CM | POA: Diagnosis not present

## 2014-08-18 DIAGNOSIS — R42 Dizziness and giddiness: Secondary | ICD-10-CM | POA: Insufficient documentation

## 2014-08-18 LAB — BASIC METABOLIC PANEL
Anion gap: 11 (ref 5–15)
BUN: 26 mg/dL — ABNORMAL HIGH (ref 6–23)
CO2: 28 mEq/L (ref 19–32)
Calcium: 10.2 mg/dL (ref 8.4–10.5)
Chloride: 98 mEq/L (ref 96–112)
Creatinine, Ser: 1.26 mg/dL (ref 0.50–1.35)
GFR calc Af Amer: 61 mL/min — ABNORMAL LOW (ref >90)
GFR calc non Af Amer: 53 mL/min — ABNORMAL LOW (ref >90)
Glucose, Bld: 127 mg/dL — ABNORMAL HIGH (ref 70–99)
Potassium: 4.6 mEq/L (ref 3.7–5.3)
Sodium: 137 mEq/L (ref 137–147)

## 2014-08-18 LAB — CBC
HCT: 41.8 % (ref 39.0–52.0)
Hemoglobin: 14.2 g/dL (ref 13.0–17.0)
MCH: 31.6 pg (ref 26.0–34.0)
MCHC: 34 g/dL (ref 30.0–36.0)
MCV: 92.9 fL (ref 78.0–100.0)
Platelets: 251 10*3/uL (ref 150–400)
RBC: 4.5 MIL/uL (ref 4.22–5.81)
RDW: 12.9 % (ref 11.5–15.5)
WBC: 6.7 10*3/uL (ref 4.0–10.5)

## 2014-08-18 LAB — I-STAT TROPONIN, ED: Troponin i, poc: 0.01 ng/mL (ref 0.00–0.08)

## 2014-08-18 MED ORDER — ASPIRIN 325 MG PO TABS
325.0000 mg | ORAL_TABLET | Freq: Once | ORAL | Status: AC
  Administered 2014-08-18: 325 mg via ORAL
  Filled 2014-08-18: qty 1

## 2014-08-18 NOTE — ED Provider Notes (Signed)
CSN: [id number]     Arrival date & time 08/18/14  2233 History   First MD Initiated Contact with Patient 08/18/14 2258     Chief Complaint  Patient presents with  . Chest Pain     (Consider location/radiation/quality/duration/timing/severity/associated sxs/prior Treatment) HPI Brian Fitzpatrick is a 80 y.o. male with past medical history of diabetes coming in with chest pain. Patient states it's on the left side it is tight with radiation to his left arm. This began around 3:30 PM this afternoon, initially with only left arm pain. Today it progressed to pressure in his chest. Patient presents emergency department because it got worse around 10 PM tonight after dinner. He denies his having occurred in the past, he denies any history of blood clots or heart attack. His most recent stress test was 3 years ago which was negative. He's had no recent fevers or coughing. He denies any association with shortness of breath, emesis or diaphoresis. Patient has no further complaints.  10 Systems reviewed and are negative for acute change except as noted in the HPI.     Past Medical History  Diagnosis Date  . Diabetes mellitus     Borderline  . BPH (benign prostatic hyperplasia)   . Arthritis   . Vertigo     left ear sets it off/ has "crystals" in it   Past Surgical History  Procedure Laterality Date  . Knee surgery    . Hernia repair    . Shoulder surgery     Family History  Problem Relation Age of Onset  . Colon cancer    . Colon cancer Sister   . Colon cancer Brother    History  Substance Use Topics  . Smoking status: Former Smoker    Types: Cigarettes  . Smokeless tobacco: Never Used     Comment: 49 YRS AGO.  Marland Kitchen Alcohol Use: 1.8 oz/week    3 Glasses of wine per week     Comment: Occasional    Review of Systems    Allergies  Review of patient's allergies indicates no known allergies.  Home Medications   Prior to Admission medications   Medication Sig Start Date End Date  Taking? Authorizing Provider  Apoaequorin (PREVAGEN PO) Take 1 tablet by mouth daily.    Historical Provider, MD  aspirin 81 MG tablet Take 81 mg by mouth. Takes about every other day.     Historical Provider, MD  Cholecalciferol (VITAMIN D PO) Take 5,000 Units by mouth daily.    Historical Provider, MD  Posey Rea POWD Take by mouth daily.    Historical Provider, MD  fish oil-omega-3 fatty acids 1000 MG capsule Take 1 g by mouth 2 (two) times daily.    Historical Provider, MD  Ginkgo Biloba 120 MG CAPS Take 1 capsule by mouth 2 (two) times daily.    Historical Provider, MD  Glucosamine HCl 1000 MG TABS Take 1 tablet by mouth 2 (two) times daily.    Historical Provider, MD  GRAPE SEED EXTRACT PO Take 1 tablet by mouth daily.    Historical Provider, MD  Javier Docker Oil 500 MG CAPS Take by mouth.    Historical Provider, MD  metFORMIN (GLUMETZA) 500 MG (MOD) 24 hr tablet Take 500 mg by mouth daily with breakfast.    Historical Provider, MD  Methylsulfonylmethane (MSM) 1000 MG TABS Take 1 tablet by mouth 2 (two) times daily.    Historical Provider, MD  Sanders take as directed  no substitution 05/27/12   Ladene Artist, MD  Multiple Vitamins-Minerals (MULTIVITAMIN WITH MINERALS) tablet Take 1 tablet by mouth daily.    Historical Provider, MD  vitamin C (ASCORBIC ACID) 500 MG tablet Take 500 mg by mouth 2 (two) times daily.    Historical Provider, MD   BP 175/95  Pulse 62  Temp(Src) 98.2 F (36.8 C) (Oral)  Resp 14  SpO2 97% Physical Exam  Nursing note and vitals reviewed. Constitutional: He is oriented to person, place, and time. Vital signs are normal. He appears well-developed and well-nourished.  Non-toxic appearance. He does not appear ill. No distress.  HENT:  Head: Normocephalic and atraumatic.  Nose: Nose normal.  Mouth/Throat: Oropharynx is clear and moist. No oropharyngeal exudate.  Eyes: Conjunctivae and EOM are normal. Pupils are equal, round, and reactive to  light. No scleral icterus.  Neck: Normal range of motion. Neck supple. No tracheal deviation, no edema, no erythema and normal range of motion present. No mass and no thyromegaly present.  Cardiovascular: Normal rate, regular rhythm, S1 normal, S2 normal, normal heart sounds, intact distal pulses and normal pulses.  Exam reveals no gallop and no friction rub.   No murmur heard. Pulses:      Radial pulses are 2+ on the right side, and 2+ on the left side.       Dorsalis pedis pulses are 2+ on the right side, and 2+ on the left side.  Pulmonary/Chest: Effort normal and breath sounds normal. No respiratory distress. He has no wheezes. He has no rhonchi. He has no rales.  Abdominal: Soft. Normal appearance and bowel sounds are normal. He exhibits no distension, no ascites and no mass. There is no hepatosplenomegaly. There is no tenderness. There is no rebound, no guarding and no CVA tenderness.  Musculoskeletal: Normal range of motion. He exhibits no edema and no tenderness.  Lymphadenopathy:    He has no cervical adenopathy.  Neurological: He is alert and oriented to person, place, and time. He has normal strength. No cranial nerve deficit or sensory deficit. GCS eye subscore is 4. GCS verbal subscore is 5. GCS motor subscore is 6.  Skin: Skin is warm, dry and intact. No petechiae and no rash noted. He is not diaphoretic. No erythema. No pallor.  Psychiatric: He has a normal mood and affect. His behavior is normal. Judgment normal.    ED Course  Procedures (including critical care time) Labs Review Labs Reviewed  BASIC METABOLIC PANEL - Abnormal; Notable for the following:    Glucose, Bld 127 (*)    BUN 26 (*)    GFR calc non Af Amer 53 (*)    GFR calc Af Amer 61 (*)    All other components within normal limits  APTT - Abnormal; Notable for the following:    aPTT 39 (*)    All other components within normal limits  CBC  TROPONIN I  TROPONIN I  PROTIME-INR  TROPONIN I  I-STAT  TROPOININ, ED    Imaging Review Dg Chest 2 View  08/18/2014   CLINICAL DATA:  Acute onset of chest pain, radiating down the left arm. Nausea. Initial encounter.  EXAM: CHEST  2 VIEW  COMPARISON:  Chest radiograph performed 12/23/2007  FINDINGS: The lungs are well-aerated and clear. There is no evidence of focal opacification, pleural effusion or pneumothorax.  The heart is borderline normal in size; the mediastinal contour is within normal limits. No acute osseous abnormalities are seen.  IMPRESSION: No acute cardiopulmonary  process seen.   Electronically Signed   By: Garald Balding M.D.   On: 08/18/2014 23:45     EKG Interpretation   Date/Time:  Wednesday August 18 2014 22:38:35 EDT Ventricular Rate:  61 PR Interval:  200 QRS Duration: 103 QT Interval:  400 QTC Calculation: 403 R Axis:   -66 Text Interpretation:  Sinus rhythm Inferior infarct, old Baseline wander  in lead(s) V1 V2 No significant change since last tracing Confirmed by  Glynn Octave [phone]) on 08/18/2014 10:57:53 PM      MDM   Final diagnoses:  None    Patient presents emergency department out of concern for chest pain. He does have a concerning story and has not had recent cardiac workup.  His current heart score is 5. He was given aspirin emergency department and will be retained in the hospital for continued evaluation.   Everlene Balls, MD 08/19/14 [phone]

## 2014-08-18 NOTE — ED Notes (Signed)
Pt presents with L shoulder pain onset 1500 while at work today, pt radiated down to hand then resolved. Tonight while lying in bed pt "felt weird" pain radiated across chest with +nausea and belching with pain resolution. Pt states pain not dull in L upper arm.

## 2014-08-19 DIAGNOSIS — R7301 Impaired fasting glucose: Secondary | ICD-10-CM | POA: Diagnosis present

## 2014-08-19 DIAGNOSIS — E1169 Type 2 diabetes mellitus with other specified complication: Secondary | ICD-10-CM | POA: Diagnosis present

## 2014-08-19 DIAGNOSIS — I059 Rheumatic mitral valve disease, unspecified: Secondary | ICD-10-CM

## 2014-08-19 DIAGNOSIS — E785 Hyperlipidemia, unspecified: Secondary | ICD-10-CM | POA: Diagnosis present

## 2014-08-19 DIAGNOSIS — R0789 Other chest pain: Secondary | ICD-10-CM | POA: Diagnosis present

## 2014-08-19 LAB — HEMOGLOBIN A1C
Hgb A1c MFr Bld: 6 % — ABNORMAL HIGH (ref <5.7)
Mean Plasma Glucose: 126 mg/dL — ABNORMAL HIGH (ref <117)

## 2014-08-19 LAB — LIPID PANEL
Cholesterol: 183 mg/dL (ref 0–200)
HDL: 57 mg/dL (ref >39)
LDL Cholesterol: 112 mg/dL — ABNORMAL HIGH (ref 0–99)
Total CHOL/HDL Ratio: 3.2 RATIO
Triglycerides: 70 mg/dL (ref <150)
VLDL: 14 mg/dL (ref 0–40)

## 2014-08-19 LAB — TROPONIN I
Troponin I: <0.3 ng/mL (ref <0.30)
Troponin I: <0.3 ng/mL (ref <0.30)
Troponin I: <0.3 ng/mL (ref <0.30)

## 2014-08-19 LAB — PROTIME-INR
INR: 1.07 (ref 0.00–1.49)
Prothrombin Time: 14 seconds (ref 11.6–15.2)

## 2014-08-19 LAB — APTT: aPTT: 39 seconds — ABNORMAL HIGH (ref 24–37)

## 2014-08-19 MED ORDER — NITROGLYCERIN 0.4 MG SL SUBL: 0.4000 mg | SUBLINGUAL_TABLET | SUBLINGUAL | Status: DC | PRN

## 2014-08-19 MED ORDER — HEPARIN BOLUS VIA INFUSION
4000.0000 [IU] | Freq: Once | INTRAVENOUS | Status: DC
  Filled 2014-08-19: qty 4000

## 2014-08-19 MED ORDER — VITAMIN D3 25 MCG (1000 UNIT) PO TABS
5000.0000 [IU] | ORAL_TABLET | Freq: Every day | ORAL | Status: DC
  Administered 2014-08-19: 5000 [IU] via ORAL
  Filled 2014-08-19: qty 5

## 2014-08-19 MED ORDER — ACETAMINOPHEN 325 MG PO TABS: 650.0000 mg | ORAL_TABLET | ORAL | Status: DC | PRN

## 2014-08-19 MED ORDER — ZOLPIDEM TARTRATE 5 MG PO TABS: 5.0000 mg | ORAL_TABLET | Freq: Every evening | ORAL | Status: DC | PRN

## 2014-08-19 MED ORDER — VITAMIN C 500 MG PO TABS
500.0000 mg | ORAL_TABLET | Freq: Two times a day (BID) | ORAL | Status: DC
  Administered 2014-08-19 (×2): 500 mg via ORAL
  Filled 2014-08-19 (×2): qty 1

## 2014-08-19 MED ORDER — OMEGA-3-ACID ETHYL ESTERS 1 G PO CAPS
1.0000 g | ORAL_CAPSULE | Freq: Every day | ORAL | Status: DC
  Administered 2014-08-19: 1 g via ORAL
  Filled 2014-08-19: qty 1

## 2014-08-19 MED ORDER — MORPHINE SULFATE 2 MG/ML IJ SOLN: 2.0000 mg | INTRAMUSCULAR | Status: DC | PRN

## 2014-08-19 MED ORDER — ASPIRIN 325 MG PO TBEC: 325.0000 mg | DELAYED_RELEASE_TABLET | Freq: Every day | ORAL | Status: DC

## 2014-08-19 MED ORDER — ASPIRIN EC 325 MG PO TBEC
325.0000 mg | DELAYED_RELEASE_TABLET | Freq: Every day | ORAL | Status: DC
  Administered 2014-08-19: 325 mg via ORAL
  Filled 2014-08-19: qty 1

## 2014-08-19 MED ORDER — ONDANSETRON HCL 4 MG/2ML IJ SOLN: 4.0000 mg | Freq: Four times a day (QID) | INTRAMUSCULAR | Status: DC | PRN

## 2014-08-19 MED ORDER — HEPARIN (PORCINE) IN NACL 100-0.45 UNIT/ML-% IJ SOLN
900.0000 [IU]/h | INTRAMUSCULAR | Status: DC
  Filled 2014-08-19: qty 250

## 2014-08-19 MED ORDER — MSM 1000 MG PO TABS: 1.0000 | ORAL_TABLET | Freq: Two times a day (BID) | ORAL | Status: DC

## 2014-08-19 MED ORDER — APOAEQUORIN 10 MG PO CAPS: ORAL_CAPSULE | Freq: Every morning | ORAL | Status: DC

## 2014-08-19 NOTE — Progress Notes (Signed)
PHARMACIST - PHYSICIAN ORDER COMMUNICATION  CONCERNING: P&T Medication Policy on Herbal Medications  DESCRIPTION:  This patient's order for:  MSM tabs and Prevagen  has been noted.  This product(s) is classified as an "herbal" or natural product. Due to a lack of definitive safety studies or FDA approval, nonstandard manufacturing practices, plus the potential risk of unknown drug-drug interactions while on inpatient medications, the Pharmacy and Therapeutics Committee does not permit the use of "herbal" or natural products of this type within [facility].   ACTION TAKEN: The pharmacy department is unable to verify this order at this time and your patient has been informed of this safety policy. Please reevaluate patient's clinical condition at discharge and address if the herbal or natural product(s) should be resumed at that time.  Thanks Dorrene German 08/19/2014 1:39 AM

## 2014-08-19 NOTE — H&P (Signed)
[facility] History and Physical  Brian Fitzpatrick FSE:[id number] DOB: 01-30-1934 DOA: 08/18/2014  Referring physician: Everlene Balls, MD PCP: Mingo Amber, MD   Chief Complaint: Chest Pain  HPI: Brian Fitzpatrick is a 80 y.o. male presents with chest pain. He states that he was doing well until about 3pm today. He developed left sided chest pain and it appeared to go down his left arm. He noted some nausea but no vomiting. He states that he did not break out into a sweat. Patient states that he has no prior history of cardiac disease. He states he has no associated shortness of breath noted. He has no fevers or chills. Currently he has no pain. In addition he did states that he had some burping and gas. Patient is active walks about 3 miles per day. He also plays golf regularly. He does have a history of diabetes mellitus type II   Review of Systems:  Constitutional:  No weight loss, night sweats, Fevers, chills, fatigue.  HEENT:  No headaches, Difficulty swallowing,Tooth/dental problems,Sore throat,  No sneezing Cardio-vascular:  ++chest pain, No Orthopnea, PND, swelling in lower extremities, palpitations  GI:  No heartburn, indigestion, abdominal pain, ++nausea, no vomiting, diarrhea,   Resp:  No shortness of breath with exertion or at rest. No excess mucus, no productive cough, No non-productive cough, No coughing up of blood  Skin:  no rash or lesions GU:  no dysuria, change in color of urine  Musculoskeletal:  No joint pain or swelling. No back pain.  Psych:  No change in mood or affect. No depression or anxiety. No memory loss.   Past Medical History  Diagnosis Date  . Diabetes mellitus     Borderline  . BPH (benign prostatic hyperplasia)   . Arthritis   . Vertigo     left ear sets it off/ has "crystals" in it   Past Surgical History  Procedure Laterality Date  . Knee surgery    . Hernia repair    . Shoulder surgery     Social History:  reports that he has  quit smoking. His smoking use included Cigarettes. He smoked 0.00 packs per day. He has never used smokeless tobacco. He reports that he drinks about 1.8 ounces of alcohol per week. He reports that he does not use illicit drugs.  No Known Allergies  Family History  Problem Relation Age of Onset  . Colon cancer    . Colon cancer Sister   . Colon cancer Brother      Prior to Admission medications   Medication Sig Start Date End Date Taking? Authorizing Provider  Apoaequorin (PREVAGEN PO) Take 1 tablet by mouth daily.   Yes Historical Provider, MD  Cholecalciferol (VITAMIN D PO) Take 5,000 Units by mouth daily.   Yes Historical Provider, MD  Chromium 1 MG CAPS Take 1 capsule by mouth daily.   Yes Historical Provider, MD  Cranberry-Milk Thistle (LIVER & KIDNEY CLEANSER) 250-75 MG CAPS Take 1 capsule by mouth 2 (two) times daily.   Yes Historical Provider, MD  fish oil-omega-3 fatty acids 1000 MG capsule Take 1 g by mouth 2 (two) times daily.   Yes Historical Provider, MD  Ginkgo Biloba 120 MG CAPS Take 1 capsule by mouth 2 (two) times daily.   Yes Historical Provider, MD  Glucosamine HCl 1000 MG TABS Take 1 tablet by mouth 2 (two) times daily.   Yes Historical Provider, MD  GRAPE SEED EXTRACT PO Take 1 tablet by mouth daily.  Yes Historical Provider, MD  Javier Docker Oil 500 MG CAPS Take by mouth.   Yes Historical Provider, MD  Methylsulfonylmethane (MSM) 1000 MG TABS Take 1 tablet by mouth 2 (two) times daily.   Yes Historical Provider, MD  Multiple Vitamins-Minerals (MULTIVITAMIN WITH MINERALS) tablet Take 1 tablet by mouth daily.   Yes Historical Provider, MD  naproxen sodium (ANAPROX) 220 MG tablet Take 440 mg by mouth 2 (two) times daily as needed (pain).   Yes Historical Provider, MD  OVER THE COUNTER MEDICATION Take 1 tablet by mouth 2 (two) times daily. Prostate health supplement   Yes Historical Provider, MD  OVER THE COUNTER MEDICATION Take 1 tablet by mouth daily. Tumeric   Yes Historical  Provider, MD  OVER THE COUNTER MEDICATION Take 1 tablet by mouth daily. Cathrine Muster supplement   Yes Historical Provider, MD  vitamin C (ASCORBIC ACID) 500 MG tablet Take 500 mg by mouth 2 (two) times daily.   Yes Historical Provider, MD   Physical Exam: Filed Vitals:   08/18/14 2243  BP: 175/95  Pulse: 62  Temp: 98.2 F (36.8 C)  TempSrc: Oral  Resp: 14  SpO2: 97%    Wt Readings from Last 3 Encounters:  05/27/12 82.555 kg (182 lb)  10/30/11 85.73 kg (189 lb)  10/22/11 85.276 kg (188 lb)    General:  Appears calm and comfortable Eyes: PERRL, normal lids, irises & conjunctiva ENT: grossly normal hearing, lips & tongue Neck: no LAD, masses or thyromegaly Cardiovascular: RRR, no m/r/g. No LE edema Respiratory: CTA bilaterally, no w/r/r. Normal respiratory effort. Abdomen: soft, ntnd Skin: no rash or induration seen on limited exam Musculoskeletal: grossly normal tone BUE/BLE Psychiatric: grossly normal mood and affect, speech fluent and appropriate Neurologic: grossly non-focal.          Labs on Admission:  Basic Metabolic Panel:  Recent Labs Lab 08/18/14 2257  NA 137  K 4.6  CL 98  CO2 28  GLUCOSE 127*  BUN 26*  CREATININE 1.26  CALCIUM 10.2   Liver Function Tests: No results found for this basename: AST, ALT, ALKPHOS, BILITOT, PROT, ALBUMIN,  in the last 168 hours No results found for this basename: LIPASE, AMYLASE,  in the last 168 hours No results found for this basename: AMMONIA,  in the last 168 hours CBC:  Recent Labs Lab 08/18/14 2257  WBC 6.7  HGB 14.2  HCT 41.8  MCV 92.9  PLT 251   Cardiac Enzymes: No results found for this basename: CKTOTAL, CKMB, CKMBINDEX, TROPONINI,  in the last 168 hours  BNP (last 3 results) No results found for this basename: PROBNP,  in the last 8760 hours CBG: No results found for this basename: GLUCAP,  in the last 168 hours  Radiological Exams on Admission: Dg Chest 2 View  08/18/2014   CLINICAL DATA:   Acute onset of chest pain, radiating down the left arm. Nausea. Initial encounter.  EXAM: CHEST  2 VIEW  COMPARISON:  Chest radiograph performed 12/23/2007  FINDINGS: The lungs are well-aerated and clear. There is no evidence of focal opacification, pleural effusion or pneumothorax.  The heart is borderline normal in size; the mediastinal contour is within normal limits. No acute osseous abnormalities are seen.  IMPRESSION: No acute cardiopulmonary process seen.   Electronically Signed   By: Garald Balding M.D.   On: 08/18/2014 23:45    EKG: Independently reviewed. NSR  Assessment/Plan Active Problems:   Chest pain   Diabetes   1. Chest Pain -  his pain is atypical not exertion related -will admit for serial enzymes -currently he is pain free -will get echo in am -will need a stress test  2. diabetes -diet controlled -will monitor FSBS -check A1C     Code Status: Full Code (must indicate code status--if unknown or must be presumed, indicate so) DVT Prophylaxis:Heparin Family Communication: Wife (indicate person spoken with, if applicable, with phone number if by telephone) Disposition Plan: Home (indicate anticipated LOS)  Time spent: 58min  KHAN,SAADAT A [facility] Pager [phone]

## 2014-08-19 NOTE — Care Management Note (Signed)
    Page 1 of 1   08/19/2014     11:35:37 AM CARE MANAGEMENT NOTE 08/19/2014  Patient:  Brian Fitzpatrick, Brian Fitzpatrick   Account Number:  [id number]  Date Initiated:  08/19/2014  Documentation initiated by:  Dessa Phi  Subjective/Objective Assessment:   80 Y/O M ADMITTED W/CHEST PAIN.     Action/Plan:   FROM HOME.   Anticipated DC Date:  08/19/2014   Anticipated DC Plan:  [place]  CM consult      Choice offered to / List presented to:             Status of service:  Completed, signed off Medicare Important Message given?   (If response is "NO", the following Medicare IM given date fields will be blank) Date Medicare IM given:   Medicare IM given by:   Date Additional Medicare IM given:   Additional Medicare IM given by:    Discharge Disposition:  HOME/SELF CARE  Per UR Regulation:  Reviewed for med. necessity/level of care/duration of stay  If discussed at [place] of Stay Meetings, dates discussed:    Comments:  08/19/14   RN,BSN NCM [phone] D/C HOME NO [place].

## 2014-08-19 NOTE — Progress Notes (Signed)
*  PRELIMINARY RESULTS* Echocardiogram 2D Echocardiogram has been performed.  Leavy Cella 08/19/2014, 9:35 AM

## 2014-08-19 NOTE — Discharge Instructions (Signed)

## 2014-08-19 NOTE — Plan of Care (Signed)
RN paged this NP secondary to pt refusing Heparin infusion. RN explained it was prophylactic tx for MI since he came in with chest pain and also used to prevent clots. He still refused to have it stating "I don't think I had a heart attack" and the "chest pain started a long time ago". No active chest pain now. Trops have been neg x 2 so far. RN to see if he will wear SCDs as VTE ppx.

## 2014-08-19 NOTE — Progress Notes (Signed)
ANTICOAGULATION CONSULT NOTE - Initial Consult  Pharmacy Consult for IV Heparin Indication: ACS  No Known Allergies  Patient Measurements: Height: 5\' 8"  (172.7 cm) Weight: 182 lb 5.1 oz (82.7 kg) IBW/kg (Calculated) : 68.4 Heparin Dosing Weight: 73 kg  Vital Signs: Temp: 97.8 F (36.6 C) (10/08 0131) Temp Source: Oral (10/08 0131) BP: 155/86 mmHg (10/08 0131) Pulse Rate: 69 (10/08 0131)  Labs:  Recent Labs  08/18/14 2257  HGB 14.2  HCT 41.8  PLT 251  CREATININE 1.26    Estimated Creatinine Clearance: 50.6 ml/min (by C-G formula based on Cr of 1.26).   Medical History: Past Medical History  Diagnosis Date  . Diabetes mellitus     Borderline  . BPH (benign prostatic hyperplasia)   . Arthritis   . Vertigo     left ear sets it off/ has "crystals" in it    Medications:  Scheduled:  . aspirin EC  325 mg Oral Daily  . cholecalciferol  5,000 Units Oral Daily  . omega-3 acid ethyl esters  1 g Oral Daily  . vitamin C  500 mg Oral BID   Infusions:    Assessment: 22 yoM c/o CP. IV heparin per Rx for ACS. Tropinin=0.01 Goal of Therapy:  Heparin level 0.3-0.7 units/ml Monitor platelets by anticoagulation protocol: Yes   Plan:   Baseline coags stat  Heparin 4000 unit bolus x1  Start drip @ 900 units/hr  Daily CBC/HL  Check 1st HL in 8 hours  Lawana Pai R 08/19/2014,1:56 AM

## 2014-08-19 NOTE — Progress Notes (Addendum)
The patient did not want to start the Heparin infusion. Patient refused. Patient was educated orally and by handouts.  PCP on call was notified. Heparin was not started.  Patient stated that he had a "little pain down left arm", and "it was a long time ago now".

## 2014-08-19 NOTE — Discharge Summary (Signed)
Physician Discharge Summary  Brian Fitzpatrick TML:[id number] DOB: 04-19-1934 DOA: 08/18/2014  PCP: Mingo Amber, MD  Admit date: 08/18/2014 Discharge date: 08/19/2014   Recommendations for Outpatient Follow-Up:   1. The patient will F/U with [facility] cardiology for a stress test.  The office will call him with an appointment time. 2. Patient prefers to treat his medical conditions with natural supplements. Would encourage PCP and his other treating physicians to have an ongoing discussion with him about whether or not it makes sense to add a statin and possibly metformin to his regimen.   Discharge Diagnosis:   Principal Problem:    Atypical chest pain Active Problems:    Impaired fasting glucose    Dyslipidemia   Discharge Condition: Improved.  Diet recommendation: Low sodium, heart healthy.  Carbohydrate-modified.     History of Present Illness:   Brian Fitzpatrick is an 80 y.o. male with a PMH of borderline diabetes on multiple OTC supplements, active, who was admitted 08/18/14 chief complaint of chest pain associated with nausea. He was placed on heparin on admission, but refused this. Troponins have been negative x3.  Hospital Course by Problem:   Principal Problem:   Atypical chest pain  The patient described his pain as nonexertional, and given that he is quite active, it is unlikely that his pain is from significant coronary disease.  The patient does have some risk factors for heart disease however, he would prefer to followup with his cardiologist as an outpatient for stress testing rather than stay in the hospital for this.  Would continue aspirin therapy.  12-lead EKG done on admission negative for ischemic changes, although there was Q waves in V1.  Called card master to arrange for outpatient stress testing. Office will call with an appointment time.  2-D echocardiogram showed mild LVH with normal systolic function, EF 68-12% with no regional wall motion  abnormalities. Grade 1 diastolic dysfunction.  The patient reported no further chest pain prior to discharge.  Active Problems:   Impaired fasting glucose  Patient told me he has borderline diabetes and controls this with diet.  Hemoglobin A1c 6%.    Dyslipidemia  Lipid panel not back yet prior to discharge, but the patient prefers to manage his medical issues with alternative/verbal remedies.  Would recommend cardiologist discussed risks/benefits of adding a statin to his regimen, although this could potentially exacerbate his borderline diabetes.   Medical Consultants:    None.   Discharge Exam:   Filed Vitals:   08/19/14 0840  BP: 124/68  Pulse: 65  Temp: 98 F (36.7 C)  Resp: 16   Filed Vitals:   08/19/14 0106 08/19/14 0131 08/19/14 0440 08/19/14 0840  BP: 152/93 155/86 149/86 124/68  Pulse: 66 69 57 65  Temp:  97.8 F (36.6 C) 97.5 F (36.4 C) 98 F (36.7 C)  TempSrc:  Oral Oral Oral  Resp: 20 16 14 16   Height:  5\' 8"  (1.727 m)  5\' 8"  (1.727 m)  Weight:  82.7 kg (182 lb 5.1 oz)  84.7 kg (186 lb 11.7 oz)  SpO2: 97% 96% 97% 95%    Gen:  NAD Cardiovascular:  RRR, No M/R/G Respiratory: Lungs CTAB Gastrointestinal: Abdomen soft, NT/ND with normal active bowel sounds. Extremities: No C/E/C   The results of significant diagnostics from this hospitalization (including imaging, microbiology, ancillary and laboratory) are listed below for reference.     Procedures and Diagnostic Studies:   Dg Chest 2 View 08/18/2014: No acute cardiopulmonary process  seen.     2-D echocardiogram 08/19/14: Mild LVH. Systolic function normal. EF 55-65%. No regional wall motion abnormalities. Grade 1 diastolic dysfunction. Mild mitral valve regurgitation. Right Atrium mildly dilated.   Labs:   Basic Metabolic Panel:  Recent Labs Lab 08/18/14 2257  NA 137  K 4.6  CL 98  CO2 28  GLUCOSE 127*  BUN 26*  CREATININE 1.26  CALCIUM 10.2   GFR Estimated Creatinine  Clearance: 51.2 ml/min (by C-G formula based on Cr of 1.26).  Coagulation profile  Recent Labs Lab 08/19/14 0200  INR 1.07    CBC:  Recent Labs Lab 08/18/14 2257  WBC 6.7  HGB 14.2  HCT 41.8  MCV 92.9  PLT 251   Cardiac Enzymes:  Recent Labs Lab 08/19/14 0200 08/19/14 0429 08/19/14 0730  TROPONINI <0.30 <0.30 <0.30   Hgb A1c  Recent Labs  08/18/14 2256  HGBA1C 6.0*   Lipid Profile  Recent Labs  08/19/14 0730  CHOL 183  HDL 57  LDLCALC 112*  TRIG 70  CHOLHDL 3.2    Discharge Instructions:       Discharge Instructions   Call MD for:  extreme fatigue    Complete by:  As directed      Call MD for:  persistant nausea and vomiting    Complete by:  As directed      Call MD for:  severe uncontrolled pain    Complete by:  As directed      Diet - low sodium heart healthy    Complete by:  As directed      Discharge instructions    Complete by:  As directed   You were cared for by Dr. Jacquelynn Cree  (a hospitalist) during your hospital stay. If you have any questions about your discharge medications or the care you received while you were in the hospital after you are discharged, you can call the unit and ask to speak with the hospitalist on call if the hospitalist that took care of you is not available. Once you are discharged, your primary care physician will handle any further medical issues. Please note that NO REFILLS for any discharge medications will be authorized once you are discharged, as it is imperative that you return to your primary care physician (or establish a relationship with a primary care physician if you do not have one) for your aftercare needs so that they can reassess your need for medications and monitor your lab values.  Any outstanding tests can be reviewed by your PCP at your follow up visit.  It is also important to review any medicine changes with your PCP.  Please bring these d/c instructions with you to your next visit so your  physician can review these changes with you.     Increase activity slowly    Complete by:  As directed             Medication List         aspirin 325 MG EC tablet  Take 1 tablet (325 mg total) by mouth daily.     Chromium 1 MG Caps  Take 1 capsule by mouth daily.     fish oil-omega-3 fatty acids 1000 MG capsule  Take 1 g by mouth 2 (two) times daily.     Ginkgo Biloba 120 MG Caps  Take 1 capsule by mouth 2 (two) times daily.     Glucosamine HCl 1000 MG Tabs  Take 1 tablet by mouth 2 (two)  times daily.     GRAPE SEED EXTRACT PO  Take 1 tablet by mouth daily.     Krill Oil 500 MG Caps  Take by mouth.     LIVER & KIDNEY CLEANSER 250-75 MG Caps  Generic drug:  Cranberry-Milk Thistle  Take 1 capsule by mouth 2 (two) times daily.     MSM 1000 MG Tabs  Take 1 tablet by mouth 2 (two) times daily.     multivitamin with minerals tablet  Take 1 tablet by mouth daily.     naproxen sodium 220 MG tablet  Commonly known as:  ANAPROX  Take 440 mg by mouth 2 (two) times daily as needed (pain).     OVER THE COUNTER MEDICATION  Take 1 tablet by mouth 2 (two) times daily. Prostate health supplement     OVER THE COUNTER MEDICATION  Take 1 tablet by mouth daily. Tumeric     OVER THE COUNTER MEDICATION  Take 1 tablet by mouth daily. Hawthorne Berry supplement     PREVAGEN PO  Take 1 tablet by mouth daily.     vitamin C 500 MG tablet  Commonly known as:  ASCORBIC ACID  Take 500 mg by mouth 2 (two) times daily.     VITAMIN D PO  Take 5,000 Units by mouth daily.       Follow-up Information   Schedule an appointment as soon as possible for a visit with Mingo Amber, MD. (If symptoms worsen)    Specialty:  Surgery   Contact information:   [place] [address] [phone]       Follow up with Kirk Ruths, MD. (Office will call you tomorrow with an appointment time for a stress test)    Specialty:  Cardiology   Contact information:   [address] [place] [address] [phone]        Time coordinating discharge: 35 minutes.  Signed:  RAMA,CHRISTINA  Pager [phone] [facility] 08/19/2014, 4:09 PM

## 2014-08-19 NOTE — Plan of Care (Signed)
Problem: Phase I Progression Outcomes Goal: Anginal pain relieved Outcome: Completed/Met Date Met:  08/19/14 No complaints of further chest/anginal pain

## 2014-09-16 ENCOUNTER — Ambulatory Visit (INDEPENDENT_AMBULATORY_CARE_PROVIDER_SITE_OTHER): Payer: Medicare HMO | Admitting: Cardiology

## 2014-09-16 ENCOUNTER — Ambulatory Visit: Payer: Medicare HMO | Admitting: Cardiology

## 2014-09-16 ENCOUNTER — Encounter: Payer: Self-pay | Admitting: Cardiology

## 2014-09-16 VITALS — BP 120/74 | HR 70 | Ht 68.5 in | Wt 182.9 lb

## 2014-09-16 DIAGNOSIS — E118 Type 2 diabetes mellitus with unspecified complications: Secondary | ICD-10-CM | POA: Insufficient documentation

## 2014-09-16 DIAGNOSIS — E1159 Type 2 diabetes mellitus with other circulatory complications: Secondary | ICD-10-CM

## 2014-09-16 DIAGNOSIS — R079 Chest pain, unspecified: Secondary | ICD-10-CM

## 2014-09-16 DIAGNOSIS — E785 Hyperlipidemia, unspecified: Secondary | ICD-10-CM

## 2014-09-16 NOTE — Assessment & Plan Note (Signed)
Recent hospitalization for chest pain- MI r/o.

## 2014-09-16 NOTE — Patient Instructions (Signed)
Your physician recommends that you schedule a follow-up appointment in: Dr Stanford Breed after test  Your physician has requested that you have en exercise stress myoview. For further information please visit [email or web address]. Please follow instruction sheet, as given.

## 2014-09-16 NOTE — Progress Notes (Signed)
09/16/2014 Brian Fitzpatrick   [birth year]  [id number]  Primary Physician Mingo Amber, MD Primary Cardiologist: Dr Stanford Breed  HPI:  80 y/o male, seen by Dr Stanford Breed a few years ago. Pt has a history of NIDDM. He was recently admitted to [facility] with chest pain. He was in bed watching TV and developed pain across his shoulders. His wife urged him to go to the hospital (she is an Therapist, sports).  He ruled out for an MI. He was supposed to have an OP stress Myoview and then follow up but this test hasn't been scheduled yet. He has not had recurrent chest pain. He says he is going to climb [facility] in the spring and wants to make sure his heart is OK.    Current Outpatient Prescriptions  Medication Sig Dispense Refill  . Apoaequorin (PREVAGEN PO) Take 1 tablet by mouth daily.    . Cholecalciferol (VITAMIN D PO) Take 5,000 Units by mouth daily.    . Chromium 1 MG CAPS Take 1 capsule by mouth daily.    Lovell Sheehan Thistle (LIVER & KIDNEY CLEANSER) 250-75 MG CAPS Take 1 capsule by mouth 2 (two) times daily.    . fish oil-omega-3 fatty acids 1000 MG capsule Take 1 g by mouth 2 (two) times daily.    . Ginkgo Biloba 120 MG CAPS Take 1 capsule by mouth 2 (two) times daily.    . Glucosamine HCl 1000 MG TABS Take 1 tablet by mouth 2 (two) times daily.    Marland Kitchen GRAPE SEED EXTRACT PO Take 1 tablet by mouth daily.    Javier Docker Oil 500 MG CAPS Take by mouth.    . metFORMIN (GLUCOPHAGE) 500 MG tablet Take 1 tablet by mouth daily.    . Methylsulfonylmethane (MSM) 1000 MG TABS Take 1 tablet by mouth 2 (two) times daily.    . Multiple Vitamins-Minerals (MULTIVITAMIN WITH MINERALS) tablet Take 1 tablet by mouth daily.    . naproxen sodium (ANAPROX) 220 MG tablet Take 440 mg by mouth 2 (two) times daily as needed (pain).    Marland Kitchen OVER THE COUNTER MEDICATION Take 1 tablet by mouth 2 (two) times daily. Prostate health supplement    . OVER THE COUNTER MEDICATION Take 1 tablet by mouth daily. Tumeric    . OVER THE COUNTER  MEDICATION Take 1 tablet by mouth daily. Hawthorne Berry supplement    . vitamin C (ASCORBIC ACID) 500 MG tablet Take 500 mg by mouth 2 (two) times daily.     No current facility-administered medications for this visit.    No Known Allergies  History   Social History  . Marital Status: Married    Spouse Name: [facility]    Number of Children: 3  . Years of Education: N/A   Occupational History  .      Retired   Social History Main Topics  . Smoking status: Former Smoker    Types: Cigarettes  . Smokeless tobacco: Never Used     Comment: 37 YRS AGO.  Marland Kitchen Alcohol Use: 1.8 oz/week    3 Glasses of wine per week     Comment: Occasional  . Drug Use: No  . Sexual Activity: Not on file   Other Topics Concern  . Not on file   Social History Narrative     Review of Systems: General: negative for chills, fever, night sweats or weight changes.  Cardiovascular: negative for chest pain, dyspnea on exertion, edema, orthopnea, palpitations, paroxysmal nocturnal dyspnea  or shortness of breath Dermatological: negative for rash Respiratory: negative for cough or wheezing Urologic: negative for hematuria Abdominal: negative for nausea, vomiting, diarrhea, bright red blood per rectum, melena, or hematemesis Neurologic: negative for visual changes, syncope, or dizziness All other systems reviewed and are otherwise negative except as noted above.    Blood pressure 120/74, pulse 70, height 5' 8.5" (1.74 m), weight 182 lb 14.4 oz (82.963 kg).  General appearance: alert, cooperative and no distress Neck: no carotid bruit and no JVD Lungs: clear to auscultation bilaterally Heart: regular rate and rhythm Abdomen: soft, non-tender; bowel sounds normal; no masses,  no organomegaly Extremities: extremities normal, atraumatic, no cyanosis or edema Pulses: 2+ and symmetric Skin: Skin color, texture, turgor normal. No rashes or lesions Neurologic: Grossly normal  EKG from 08/18/14 showed  inferior Qs.  ASSESSMENT AND PLAN:   Chest pain with moderate risk of acute coronary syndrome Recent hospitalization for chest pain- MI r/o.  Dyslipidemia LDL 112  Type 2 diabetes mellitus with circulatory disorder .   PLAN  The pt has had chest pain suspicious for angina and multiple cardiac risk factors, including diabetes. He also has an abnormal EKG. I have scheduled him for an exercise Myoview and follow up with Dr Stanford Breed.   [facility] 09/16/2014 4:27 PM

## 2014-09-16 NOTE — Assessment & Plan Note (Signed)
LDL 112

## 2014-09-23 ENCOUNTER — Ambulatory Visit: Payer: Medicare Other | Admitting: Cardiology

## 2014-09-23 ENCOUNTER — Telehealth (HOSPITAL_COMMUNITY): Payer: Self-pay

## 2014-09-23 NOTE — Telephone Encounter (Signed)
Encounter complete. 

## 2014-09-24 ENCOUNTER — Telehealth (HOSPITAL_COMMUNITY): Payer: Self-pay

## 2014-09-24 NOTE — Telephone Encounter (Signed)
Encounter complete. 

## 2014-09-28 ENCOUNTER — Ambulatory Visit (HOSPITAL_COMMUNITY)
Admission: RE | Admit: 2014-09-28 | Discharge: 2014-09-28 | Disposition: A | Discharge status: Home / Self Care | Payer: Medicare HMO | Source: Ambulatory Visit | Attending: Cardiovascular Disease | Admitting: Cardiovascular Disease

## 2014-09-28 DIAGNOSIS — E785 Hyperlipidemia, unspecified: Secondary | ICD-10-CM | POA: Insufficient documentation

## 2014-09-28 DIAGNOSIS — R42 Dizziness and giddiness: Secondary | ICD-10-CM | POA: Diagnosis not present

## 2014-09-28 DIAGNOSIS — R0609 Other forms of dyspnea: Secondary | ICD-10-CM | POA: Insufficient documentation

## 2014-09-28 DIAGNOSIS — R079 Chest pain, unspecified: Secondary | ICD-10-CM | POA: Diagnosis present

## 2014-09-28 DIAGNOSIS — Z87891 Personal history of nicotine dependence: Secondary | ICD-10-CM | POA: Diagnosis not present

## 2014-09-28 MED ORDER — TECHNETIUM TC 99M SESTAMIBI GENERIC - CARDIOLITE
31.2000 | Freq: Once | INTRAVENOUS | Status: AC | PRN
  Administered 2014-09-28: 31.2 via INTRAVENOUS

## 2014-09-28 MED ORDER — TECHNETIUM TC 99M SESTAMIBI GENERIC - CARDIOLITE
10.9000 | Freq: Once | INTRAVENOUS | Status: AC | PRN
  Administered 2014-09-28: 10.9 via INTRAVENOUS

## 2014-09-28 NOTE — Procedures (Addendum)
[place] [address] [place] [address] [phone]  Cardiology Nuclear Med Study  Brian Fitzpatrick is a 80 y.o. male     MRN : [id number]     DOB: 05-20-1934  Procedure Date: 09/28/2014  Nuclear Med Background Indication for Stress Test:  Evaluation for Ischemia, [facility] and Abnormal EKG History:  No prior cardiac or respiratory history reported;No prior NUC MPI for comparison. Cardiac Risk Factors: History of Smoking, Lipids, NIDDM and Overweight  Symptoms:  Chest Pain, Dizziness and DOE   Nuclear Pre-Procedure Caffeine/Decaff Intake:  1:00am NPO After: 11am   IV Site: R Forearm  IV 0.9% NS with Angio Cath:  22g  Chest Size (in):  44"  IV Started by: Rolene Course, RN  Height: 5' 8.5" (1.74 m)  Cup Size: n/a  BMI:  Body mass index is 27.27 kg/(m^2). Weight:  182 lb (82.555 kg)   Tech Comments:  n/a    Nuclear Med Study 1 or 2 day study: 1 day  Stress Test Type:  Stress  Order Authorizing Provider:  Kirk Ruths, MD   Resting Radionuclide: Technetium 39m Sestamibi  Resting Radionuclide Dose: 10.9 mCi   Stress Radionuclide:  Technetium 6m Sestamibi  Stress Radionuclide Dose: 31.2 mCi           Stress Protocol Rest HR: 65 Stress HR:127  Rest BP: 143/76 Stress BP: 199/78  Exercise Time (min): 10:45 METS: 11.40   Predicted Max HR: 142 bpm % Max HR: 89.44 bpm Rate Pressure Product: 25273  Dose of Adenosine (mg):  n/a Dose of Lexiscan: n/a mg  Dose of Atropine (mg): n/a Dose of Dobutamine: n/a mcg/kg/min (at max HR)  Stress Test Technologist: Mellody Memos, CCT Nuclear Technologist: Imagene Riches, CNMT   Rest Procedure:  Myocardial perfusion imaging was performed at rest 45 minutes following the intravenous administration of Technetium 75m Sestamibi. Stress Procedure:  The patient performed treadmill exercise using a Bruce  Protocol for 10 minutes 45 seconds. The patient stopped due to shortness of  breath. Patient denied any chest pain.  There were  significant ST-T wave changes.  Technetium 26m Sestamibi was injected at peak exercise and myocardial perfusion imaging was performed after a brief delay.  Transient Ischemic Dilatation (Normal <1.22):  1.06  QGS EDV:  87 ml QGS ESV:  34 ml LV Ejection Fraction: 61%        Rest ECG: NSR - Normal EKG  Stress ECG: No significant change from baseline ECG  QPS Raw Data Images:  Normal; no motion artifact; normal heart/lung ratio. Stress Images:  Normal homogeneous uptake in all areas of the myocardium. Rest Images:  Normal homogeneous uptake in all areas of the myocardium. Subtraction (SDS):  No evidence of ischemia.  Impression Exercise Capacity:  Excellent exercise capacity. BP Response:  Normal blood pressure response. Clinical Symptoms:  No significant symptoms noted. ECG Impression:  No significant ST segment change suggestive of ischemia. Comparison with Prior Nuclear Study: No previous nuclear study performed  Overall Impression:  Normal stress nuclear study.  LV Wall Motion:  NL LV Function; NL Wall Motion   Lorretta Harp, MD  09/28/2014 5:30 PM

## 2014-10-15 ENCOUNTER — Ambulatory Visit: Payer: Medicare HMO | Admitting: Cardiovascular Disease

## 2014-10-25 ENCOUNTER — Ambulatory Visit: Payer: Medicare HMO | Admitting: Cardiology

## 2015-01-12 ENCOUNTER — Encounter (HOSPITAL_COMMUNITY): Payer: Self-pay

## 2015-01-12 ENCOUNTER — Emergency Department (INDEPENDENT_AMBULATORY_CARE_PROVIDER_SITE_OTHER): Payer: PPO

## 2015-01-12 ENCOUNTER — Emergency Department (INDEPENDENT_AMBULATORY_CARE_PROVIDER_SITE_OTHER)
Admission: EM | Admit: 2015-01-12 | Discharge: 2015-01-12 | Disposition: A | Discharge status: Home / Self Care | Payer: PPO | Source: Home / Self Care | Attending: Emergency Medicine | Admitting: Emergency Medicine

## 2015-01-12 DIAGNOSIS — J209 Acute bronchitis, unspecified: Secondary | ICD-10-CM

## 2015-01-12 MED ORDER — PREDNISONE 50 MG PO TABS: ORAL_TABLET | ORAL | Status: DC

## 2015-01-12 MED ORDER — HYDROCODONE-HOMATROPINE 5-1.5 MG/5ML PO SYRP: 5.0000 mL | ORAL_SOLUTION | Freq: Four times a day (QID) | ORAL | Status: DC | PRN

## 2015-01-12 NOTE — Discharge Instructions (Signed)
You have bronchitis. Take prednisone 1 pill daily for 5 days. Use the Hycodan every 4 hours as needed for cough. This has a narcotic medicine in it so do not take it while driving. You should see improvement in the next 2 days. If your symptoms worsen, you develop fevers, or shortness of breath please come back.

## 2015-01-12 NOTE — ED Notes (Signed)
Reports cough since 2-28, minimal relief w OTC medications. NAD

## 2015-01-12 NOTE — ED Provider Notes (Signed)
CSN: [id number]     Arrival date & time 01/12/15  1334 History   First MD Initiated Contact with Patient 01/12/15 1438     Chief Complaint  Patient presents with  . URI   (Consider location/radiation/quality/duration/timing/severity/associated sxs/prior Treatment) HPI He is a 81 year old man here with his wife for evaluation of cough. He states his symptoms started on Saturday with nasal congestion, rhinorrhea, postnasal drip, mild cough. On Sunday, he states the cough started to worsen. It is productive. His wife reports low-grade temperature of 99.6 at home. No nausea or vomiting. No shortness of breath. He has been using over-the-counter cough drops with minimal improvement. His wife had similar symptoms last week. She was seen and treated with a Z-Pak.  Past Medical History  Diagnosis Date  . Diabetes mellitus     Borderline  . BPH (benign prostatic hyperplasia)   . Arthritis   . Vertigo     left ear sets it off/ has "crystals" in it   Past Surgical History  Procedure Laterality Date  . Knee surgery    . Hernia repair    . Shoulder surgery     Family History  Problem Relation Age of Onset  . Colon cancer    . Colon cancer Sister   . Colon cancer Brother    History  Substance Use Topics  . Smoking status: Former Smoker    Types: Cigarettes  . Smokeless tobacco: Never Used     Comment: 38 YRS AGO.  Marland Kitchen Alcohol Use: 1.8 oz/week    3 Glasses of wine per week     Comment: Occasional    Review of Systems  Constitutional: Negative for fever and chills.  HENT: Positive for congestion, postnasal drip, rhinorrhea and sore throat (raw feeling). Negative for trouble swallowing.   Respiratory: Positive for cough. Negative for shortness of breath.   Cardiovascular: Positive for chest pain ( with cough only).  Gastrointestinal: Negative for vomiting.  Musculoskeletal: Negative for myalgias.  Neurological: Negative for headaches.    Allergies  Review of patient's allergies  indicates no known allergies.  Home Medications   Prior to Admission medications   Medication Sig Start Date End Date Taking? Authorizing Provider  Apoaequorin (PREVAGEN PO) Take 1 tablet by mouth daily.    Historical Provider, MD  Cholecalciferol (VITAMIN D PO) Take 5,000 Units by mouth daily.    Historical Provider, MD  Chromium 1 MG CAPS Take 1 capsule by mouth daily.    Historical Provider, MD  Cranberry-Milk Thistle (LIVER & KIDNEY CLEANSER) 250-75 MG CAPS Take 1 capsule by mouth 2 (two) times daily.    Historical Provider, MD  fish oil-omega-3 fatty acids 1000 MG capsule Take 1 g by mouth 2 (two) times daily.    Historical Provider, MD  Ginkgo Biloba 120 MG CAPS Take 1 capsule by mouth 2 (two) times daily.    Historical Provider, MD  Glucosamine HCl 1000 MG TABS Take 1 tablet by mouth 2 (two) times daily.    Historical Provider, MD  GRAPE SEED EXTRACT PO Take 1 tablet by mouth daily.    Historical Provider, MD  HYDROcodone-homatropine (HYCODAN) 5-1.5 MG/5ML syrup Take 5 mLs by mouth every 6 (six) hours as needed for cough. 01/12/15   Melony Overly, MD  Krill Oil 500 MG CAPS Take by mouth.    Historical Provider, MD  metFORMIN (GLUCOPHAGE) 500 MG tablet Take 1 tablet by mouth daily. 08/24/14   Historical Provider, MD  Methylsulfonylmethane (MSM) 1000 MG TABS  Take 1 tablet by mouth 2 (two) times daily.    Historical Provider, MD  Multiple Vitamins-Minerals (MULTIVITAMIN WITH MINERALS) tablet Take 1 tablet by mouth daily.    Historical Provider, MD  naproxen sodium (ANAPROX) 220 MG tablet Take 440 mg by mouth 2 (two) times daily as needed (pain).    Historical Provider, MD  OVER THE COUNTER MEDICATION Take 1 tablet by mouth 2 (two) times daily. Prostate health supplement    Historical Provider, MD  OVER THE COUNTER MEDICATION Take 1 tablet by mouth daily. Tumeric    Historical Provider, MD  OVER THE COUNTER MEDICATION Take 1 tablet by mouth daily. Hawthorne Berry supplement    Historical  Provider, MD  predniSONE (DELTASONE) 50 MG tablet Take 1 pill daily for 5 days. 01/12/15   Melony Overly, MD  vitamin C (ASCORBIC ACID) 500 MG tablet Take 500 mg by mouth 2 (two) times daily.    Historical Provider, MD   BP 162/92 mmHg  Pulse 62  Temp(Src) 98 F (36.7 C) (Oral)  Resp 16  SpO2 98% Physical Exam  Constitutional: He is oriented to person, place, and time. He appears well-developed and well-nourished. No distress.  HENT:  Head: Normocephalic and atraumatic.  Nose: Rhinorrhea present.  Mouth/Throat: No oropharyngeal exudate.  Clear postnasal drainage  Neck: Neck supple.  Cardiovascular: Normal rate, regular rhythm and normal heart sounds.   No murmur heard. Pulmonary/Chest: Effort normal. No respiratory distress. He has no wheezes. He has rales (faint crackles in bilateral bases).  Lymphadenopathy:    He has no cervical adenopathy.  Neurological: He is alert and oriented to person, place, and time.    ED Course  Procedures (including critical care time) Labs Review Labs Reviewed - No data to display  Imaging Review Dg Chest 2 View  01/12/2015   CLINICAL DATA:  Chest congestion. Elevated blood pressure. Type 2 diabetic.  EXAM: CHEST  2 VIEW  COMPARISON:  08/18/2014.  FINDINGS: Hyperinflation is present consistent with emphysema. No airspace disease. No effusion. The cardiopericardial silhouette is within normal limits. RIGHT AC joint osteoarthritis. Thoracic spine DISH.  IMPRESSION: Hyperinflation consistent with emphysema. No acute cardiopulmonary disease.   Electronically Signed   By: Dereck Ligas M.D.   On: 01/12/2015 15:39     MDM   1. Acute bronchitis, unspecified organism    We'll treat with prednisone and Hycodan. Reviewed reasons to return as in after visit summary.    Melony Overly, MD 01/12/15 [phone]

## 2015-01-13 ENCOUNTER — Other Ambulatory Visit: Payer: Self-pay | Admitting: Dermatology

## 2015-07-30 ENCOUNTER — Encounter (HOSPITAL_COMMUNITY): Payer: Self-pay | Admitting: Emergency Medicine

## 2015-07-30 ENCOUNTER — Emergency Department (INDEPENDENT_AMBULATORY_CARE_PROVIDER_SITE_OTHER)
Admission: EM | Admit: 2015-07-30 | Discharge: 2015-07-30 | Disposition: A | Discharge status: Home / Self Care | Payer: PPO | Source: Home / Self Care | Attending: Family Medicine | Admitting: Family Medicine

## 2015-07-30 DIAGNOSIS — Z87891 Personal history of nicotine dependence: Secondary | ICD-10-CM

## 2015-07-30 DIAGNOSIS — R0789 Other chest pain: Secondary | ICD-10-CM

## 2015-07-30 DIAGNOSIS — E785 Hyperlipidemia, unspecified: Secondary | ICD-10-CM | POA: Diagnosis not present

## 2015-07-30 DIAGNOSIS — R7301 Impaired fasting glucose: Secondary | ICD-10-CM | POA: Diagnosis not present

## 2015-07-30 DIAGNOSIS — R079 Chest pain, unspecified: Secondary | ICD-10-CM

## 2015-07-30 DIAGNOSIS — S90851A Superficial foreign body, right foot, initial encounter: Secondary | ICD-10-CM

## 2015-07-30 MED ORDER — MUPIROCIN CALCIUM 2 % EX CREA: 1.0000 "application " | TOPICAL_CREAM | Freq: Two times a day (BID) | CUTANEOUS | Status: DC

## 2015-07-30 NOTE — Discharge Instructions (Signed)
It was a pleasure to see you today.   I removed the sliver of glass from the right heel.  Please keep it bandaged for 24 hours, then remove the bandage and bathe/keep clean as you otherwise would.   You may use small amount of bactroban twice daily for the first 2-3 days; please see your regular doctor if the area becomes red, if it expresses any pus or blood, or if it feels as though there is a remaining foreign body.    Sliver Removal You have had a sliver (splinter) removed. This has caused a wound that extends through some or all layers of the skin and possibly into the subcutaneous tissue. This is the tissue just beneath the skin. Because these wounds can not be cleaned well, it is necessary to watch closely for infection. AFTER THE PROCEDURE  If a cut (incision) was necessary to remove this, it may have been repaired for you by your caregiver either with suturing, stapling, or adhesive strips. These keep together the skin edges and allow better and faster healing. HOME CARE INSTRUCTIONS   A dressing may have been applied. This may be changed once per day or as instructed. If the dressing sticks, it may be soaked off with a gauze pad or clean cloth that has been dampened with soapy water or hydrogen peroxide.  It is difficult to remove all slivers or foreign bodies as they may break or splinter into smaller pieces. Be aware that your body will work to remove the foreign substance. That is, the foreign body may work itself out of the wound. That is normal.  Watch for signs of infection and notify your caregiver if you suspect a sliver or foreign body remains in the wound.  You may have received a recommendation to follow up with your physician or a specialist. It is very important to call for or keep follow-up appointments in order to avoid infection or other complications.  Only take over-the-counter or prescription medicines for pain, discomfort, or fever as directed by your  caregiver.  If antibiotics were prescribed, be sure to finish all of the medicine. If you did not receive a tetanus shot today because you did not recall when your last one was given, check with your caregiver in the next day or two during follow up to determine if one is needed. SEEK MEDICAL CARE IF:   The area around the wound has new or worsening redness or tenderness.  Pus is coming from the wound  There is a foul smell from the wound or dressing  The edges of a wound that had been repaired break open SEEK IMMEDIATE MEDICAL CARE IF:   Red streaks are coming from the wound  An unexplained oral temperature above 102 F (38.9 C) develops. Document Released: 10/26/2000 Document Revised: 01/21/2012 Document Reviewed: 06/14/2008 [facility] Patient Information 2015 [place], [place]. This information is not intended to replace advice given to you by your health care provider. Make sure you discuss any questions you have with your health care provider.

## 2015-07-30 NOTE — ED Provider Notes (Signed)
CSN: [id number]     Arrival date & time 07/30/15  1304 History   First MD Initiated Contact with Patient 07/30/15 1351     Chief Complaint  Patient presents with  . Foreign Body  . Foot Pain   (Consider location/radiation/quality/duration/timing/severity/associated sxs/prior Treatment) Patient is a 81 y.o. male presenting with foreign body and lower extremity pain.  Foreign Body Location:  Skin (right heel, stepped on glass 3 weeks ago in kitchen.  Could not remove himself; has felt like something stuck in heel for 3 weeks. no fevers or chills,  not expressing pus. ) Suspected object:  Glass Pain quality:  Dull and pressure Pain severity:  Mild Duration:  3 weeks Progression:  Unchanged Foot Pain    Past Medical History  Diagnosis Date  . Diabetes mellitus     Borderline  . BPH (benign prostatic hyperplasia)   . Arthritis   . Vertigo     left ear sets it off/ has "crystals" in it   Past Surgical History  Procedure Laterality Date  . Knee surgery    . Hernia repair    . Shoulder surgery     Family History  Problem Relation Age of Onset  . Colon cancer    . Colon cancer Sister   . Colon cancer Brother    Social History  Substance Use Topics  . Smoking status: Former Smoker    Types: Cigarettes  . Smokeless tobacco: Never Used     Comment: 8 YRS AGO.  Marland Kitchen Alcohol Use: 1.8 oz/week    3 Glasses of wine per week     Comment: Occasional    Review of Systems  Constitutional: Negative.     Allergies  Review of patient's allergies indicates no known allergies.  Home Medications   Prior to Admission medications   Medication Sig Start Date End Date Taking? Authorizing Provider  metFORMIN (GLUCOPHAGE) 500 MG tablet Take 1 tablet by mouth daily. 08/24/14  Yes Historical Provider, MD  Apoaequorin (PREVAGEN PO) Take 1 tablet by mouth daily.    Historical Provider, MD  Cholecalciferol (VITAMIN D PO) Take 5,000 Units by mouth daily.    Historical Provider, MD  Chromium  1 MG CAPS Take 1 capsule by mouth daily.    Historical Provider, MD  Cranberry-Milk Thistle (LIVER & KIDNEY CLEANSER) 250-75 MG CAPS Take 1 capsule by mouth 2 (two) times daily.    Historical Provider, MD  fish oil-omega-3 fatty acids 1000 MG capsule Take 1 g by mouth 2 (two) times daily.    Historical Provider, MD  Ginkgo Biloba 120 MG CAPS Take 1 capsule by mouth 2 (two) times daily.    Historical Provider, MD  Glucosamine HCl 1000 MG TABS Take 1 tablet by mouth 2 (two) times daily.    Historical Provider, MD  GRAPE SEED EXTRACT PO Take 1 tablet by mouth daily.    Historical Provider, MD  HYDROcodone-homatropine (HYCODAN) 5-1.5 MG/5ML syrup Take 5 mLs by mouth every 6 (six) hours as needed for cough. 01/12/15   Melony Overly, MD  Krill Oil 500 MG CAPS Take by mouth.    Historical Provider, MD  Methylsulfonylmethane (MSM) 1000 MG TABS Take 1 tablet by mouth 2 (two) times daily.    Historical Provider, MD  Multiple Vitamins-Minerals (MULTIVITAMIN WITH MINERALS) tablet Take 1 tablet by mouth daily.    Historical Provider, MD  mupirocin cream (BACTROBAN) 2 % Apply 1 application topically 2 (two) times daily. 07/30/15   Willeen Niece, MD  naproxen sodium (ANAPROX) 220 MG tablet Take 440 mg by mouth 2 (two) times daily as needed (pain).    Historical Provider, MD  OVER THE COUNTER MEDICATION Take 1 tablet by mouth 2 (two) times daily. Prostate health supplement    Historical Provider, MD  OVER THE COUNTER MEDICATION Take 1 tablet by mouth daily. Tumeric    Historical Provider, MD  OVER THE COUNTER MEDICATION Take 1 tablet by mouth daily. Hawthorne Berry supplement    Historical Provider, MD  predniSONE (DELTASONE) 50 MG tablet Take 1 pill daily for 5 days. 01/12/15   Melony Overly, MD  vitamin C (ASCORBIC ACID) 500 MG tablet Take 500 mg by mouth 2 (two) times daily.    Historical Provider, MD   Meds Ordered and Administered this Visit  Medications - No data to display  BP 157/80 mmHg  Pulse 62  Temp(Src)  97.9 F (36.6 C) (Oral)  Resp 20  SpO2 97% No data found.   Physical Exam  Constitutional: He is oriented to person, place, and time. He appears well-developed and well-nourished. No distress.  Neurological: He is alert and oriented to person, place, and time.  Skin: Skin is warm and dry. He is not diaphoretic.  Palpable dp pulse in right foot. Skin intact except for punctate break in skin on right heel. No surrounding erythema. Mildly raised, granulomatous appearance.     ED Course  FOREIGN BODY REMOVAL Date/Time: 07/30/2015 2:18 PM Performed by: Willeen Niece Authorized by: Willeen Niece Consent: Verbal consent obtained. Written consent not obtained. Risks and benefits: risks, benefits and alternatives were discussed Consent given by: patient Patient understanding: patient states understanding of the procedure being performed Patient consent: the patient's understanding of the procedure matches consent given Procedure consent: procedure consent matches procedure scheduled Patient identity confirmed: verbally with patient and arm band Intake: right heel. Anesthesia: local infiltration Local anesthetic: lidocaine 2% with epinephrine Patient sedated: no Patient restrained: no Patient cooperative: yes Complexity: simple 1 objects recovered. Objects recovered: sliver glass Post-procedure assessment: foreign body removed Patient tolerance: Patient tolerated the procedure well with no immediate complications Comments: Covered triple antibiotic, discussed post-procedure care and reasons for followup with regular doctor.    (including critical care time)  Labs Review Labs Reviewed - No data to display  Imaging Review No results found.   Visual Acuity Review  Right Eye Distance:   Left Eye Distance:   Bilateral Distance:    Right Eye Near:   Left Eye Near:    Bilateral Near:         MDM   1. Foreign body in foot, right, initial encounter       Willeen Niece, MD 07/30/15 [phone]

## 2015-07-30 NOTE — ED Notes (Signed)
The patient reported to the [facility] with a complaint of a piece of glass in his right heel. The patient stated that he stepped on a piece of glass on his kitchen floor and it lodged in the heel of his right foot. He stated that this occurred three weeks ago.

## 2015-11-24 DIAGNOSIS — M9905 Segmental and somatic dysfunction of pelvic region: Secondary | ICD-10-CM | POA: Diagnosis not present

## 2015-11-24 DIAGNOSIS — M5136 Other intervertebral disc degeneration, lumbar region: Secondary | ICD-10-CM | POA: Diagnosis not present

## 2015-11-24 DIAGNOSIS — M9904 Segmental and somatic dysfunction of sacral region: Secondary | ICD-10-CM | POA: Diagnosis not present

## 2015-11-24 DIAGNOSIS — M9903 Segmental and somatic dysfunction of lumbar region: Secondary | ICD-10-CM | POA: Diagnosis not present

## 2015-12-13 DIAGNOSIS — H353130 Nonexudative age-related macular degeneration, bilateral, stage unspecified: Secondary | ICD-10-CM | POA: Diagnosis not present

## 2015-12-13 DIAGNOSIS — H2513 Age-related nuclear cataract, bilateral: Secondary | ICD-10-CM | POA: Diagnosis not present

## 2015-12-13 DIAGNOSIS — H02834 Dermatochalasis of left upper eyelid: Secondary | ICD-10-CM | POA: Diagnosis not present

## 2015-12-13 DIAGNOSIS — H02831 Dermatochalasis of right upper eyelid: Secondary | ICD-10-CM | POA: Diagnosis not present

## 2016-02-17 DIAGNOSIS — L570 Actinic keratosis: Secondary | ICD-10-CM | POA: Diagnosis not present

## 2016-02-17 DIAGNOSIS — L812 Freckles: Secondary | ICD-10-CM | POA: Diagnosis not present

## 2016-02-17 DIAGNOSIS — L720 Epidermal cyst: Secondary | ICD-10-CM | POA: Diagnosis not present

## 2016-02-17 DIAGNOSIS — L821 Other seborrheic keratosis: Secondary | ICD-10-CM | POA: Diagnosis not present

## 2016-02-17 DIAGNOSIS — D1801 Hemangioma of skin and subcutaneous tissue: Secondary | ICD-10-CM | POA: Diagnosis not present

## 2016-02-28 DIAGNOSIS — L720 Epidermal cyst: Secondary | ICD-10-CM | POA: Diagnosis not present

## 2016-03-05 DIAGNOSIS — M9905 Segmental and somatic dysfunction of pelvic region: Secondary | ICD-10-CM | POA: Diagnosis not present

## 2016-03-05 DIAGNOSIS — M9903 Segmental and somatic dysfunction of lumbar region: Secondary | ICD-10-CM | POA: Diagnosis not present

## 2016-03-05 DIAGNOSIS — M5136 Other intervertebral disc degeneration, lumbar region: Secondary | ICD-10-CM | POA: Diagnosis not present

## 2016-03-05 DIAGNOSIS — M9904 Segmental and somatic dysfunction of sacral region: Secondary | ICD-10-CM | POA: Diagnosis not present

## 2016-03-08 DIAGNOSIS — M9905 Segmental and somatic dysfunction of pelvic region: Secondary | ICD-10-CM | POA: Diagnosis not present

## 2016-03-08 DIAGNOSIS — M9904 Segmental and somatic dysfunction of sacral region: Secondary | ICD-10-CM | POA: Diagnosis not present

## 2016-03-08 DIAGNOSIS — M9903 Segmental and somatic dysfunction of lumbar region: Secondary | ICD-10-CM | POA: Diagnosis not present

## 2016-03-08 DIAGNOSIS — M5136 Other intervertebral disc degeneration, lumbar region: Secondary | ICD-10-CM | POA: Diagnosis not present

## 2016-03-12 DIAGNOSIS — M5136 Other intervertebral disc degeneration, lumbar region: Secondary | ICD-10-CM | POA: Diagnosis not present

## 2016-03-12 DIAGNOSIS — M9903 Segmental and somatic dysfunction of lumbar region: Secondary | ICD-10-CM | POA: Diagnosis not present

## 2016-03-12 DIAGNOSIS — M9904 Segmental and somatic dysfunction of sacral region: Secondary | ICD-10-CM | POA: Diagnosis not present

## 2016-03-12 DIAGNOSIS — M9905 Segmental and somatic dysfunction of pelvic region: Secondary | ICD-10-CM | POA: Diagnosis not present

## 2016-03-19 DIAGNOSIS — M9905 Segmental and somatic dysfunction of pelvic region: Secondary | ICD-10-CM | POA: Diagnosis not present

## 2016-03-19 DIAGNOSIS — M5136 Other intervertebral disc degeneration, lumbar region: Secondary | ICD-10-CM | POA: Diagnosis not present

## 2016-03-19 DIAGNOSIS — M9903 Segmental and somatic dysfunction of lumbar region: Secondary | ICD-10-CM | POA: Diagnosis not present

## 2016-03-19 DIAGNOSIS — M9904 Segmental and somatic dysfunction of sacral region: Secondary | ICD-10-CM | POA: Diagnosis not present

## 2016-04-02 DIAGNOSIS — M9904 Segmental and somatic dysfunction of sacral region: Secondary | ICD-10-CM | POA: Diagnosis not present

## 2016-04-02 DIAGNOSIS — M9903 Segmental and somatic dysfunction of lumbar region: Secondary | ICD-10-CM | POA: Diagnosis not present

## 2016-04-02 DIAGNOSIS — M9905 Segmental and somatic dysfunction of pelvic region: Secondary | ICD-10-CM | POA: Diagnosis not present

## 2016-04-02 DIAGNOSIS — M5136 Other intervertebral disc degeneration, lumbar region: Secondary | ICD-10-CM | POA: Diagnosis not present

## 2016-04-18 DIAGNOSIS — M9904 Segmental and somatic dysfunction of sacral region: Secondary | ICD-10-CM | POA: Diagnosis not present

## 2016-04-18 DIAGNOSIS — M9903 Segmental and somatic dysfunction of lumbar region: Secondary | ICD-10-CM | POA: Diagnosis not present

## 2016-04-18 DIAGNOSIS — M5136 Other intervertebral disc degeneration, lumbar region: Secondary | ICD-10-CM | POA: Diagnosis not present

## 2016-04-18 DIAGNOSIS — M9905 Segmental and somatic dysfunction of pelvic region: Secondary | ICD-10-CM | POA: Diagnosis not present

## 2016-04-24 DIAGNOSIS — N400 Enlarged prostate without lower urinary tract symptoms: Secondary | ICD-10-CM | POA: Diagnosis not present

## 2016-04-25 DIAGNOSIS — M545 Low back pain, unspecified: Secondary | ICD-10-CM | POA: Insufficient documentation

## 2016-04-25 DIAGNOSIS — M5416 Radiculopathy, lumbar region: Secondary | ICD-10-CM | POA: Diagnosis not present

## 2016-04-25 DIAGNOSIS — M48061 Spinal stenosis, lumbar region without neurogenic claudication: Secondary | ICD-10-CM | POA: Insufficient documentation

## 2016-04-25 DIAGNOSIS — M4806 Spinal stenosis, lumbar region: Secondary | ICD-10-CM | POA: Diagnosis not present

## 2016-04-25 DIAGNOSIS — M4316 Spondylolisthesis, lumbar region: Secondary | ICD-10-CM | POA: Diagnosis not present

## 2016-05-04 DIAGNOSIS — M9905 Segmental and somatic dysfunction of pelvic region: Secondary | ICD-10-CM | POA: Diagnosis not present

## 2016-05-04 DIAGNOSIS — M9904 Segmental and somatic dysfunction of sacral region: Secondary | ICD-10-CM | POA: Diagnosis not present

## 2016-05-04 DIAGNOSIS — M5136 Other intervertebral disc degeneration, lumbar region: Secondary | ICD-10-CM | POA: Diagnosis not present

## 2016-05-04 DIAGNOSIS — M9903 Segmental and somatic dysfunction of lumbar region: Secondary | ICD-10-CM | POA: Diagnosis not present

## 2016-05-05 DIAGNOSIS — M4316 Spondylolisthesis, lumbar region: Secondary | ICD-10-CM | POA: Diagnosis not present

## 2016-05-05 DIAGNOSIS — M4806 Spinal stenosis, lumbar region: Secondary | ICD-10-CM | POA: Diagnosis not present

## 2016-05-09 DIAGNOSIS — M545 Low back pain: Secondary | ICD-10-CM | POA: Diagnosis not present

## 2016-05-09 DIAGNOSIS — M4806 Spinal stenosis, lumbar region: Secondary | ICD-10-CM | POA: Diagnosis not present

## 2016-05-09 DIAGNOSIS — M4316 Spondylolisthesis, lumbar region: Secondary | ICD-10-CM | POA: Diagnosis not present

## 2016-05-09 DIAGNOSIS — M5416 Radiculopathy, lumbar region: Secondary | ICD-10-CM | POA: Diagnosis not present

## 2016-05-21 DIAGNOSIS — M5136 Other intervertebral disc degeneration, lumbar region: Secondary | ICD-10-CM | POA: Diagnosis not present

## 2016-05-21 DIAGNOSIS — M9905 Segmental and somatic dysfunction of pelvic region: Secondary | ICD-10-CM | POA: Diagnosis not present

## 2016-05-21 DIAGNOSIS — M9904 Segmental and somatic dysfunction of sacral region: Secondary | ICD-10-CM | POA: Diagnosis not present

## 2016-05-21 DIAGNOSIS — M9903 Segmental and somatic dysfunction of lumbar region: Secondary | ICD-10-CM | POA: Diagnosis not present

## 2016-07-04 ENCOUNTER — Encounter: Payer: Self-pay | Admitting: Physical Therapy

## 2016-07-04 ENCOUNTER — Ambulatory Visit: Payer: PPO | Attending: Neurosurgery | Admitting: Physical Therapy

## 2016-07-04 DIAGNOSIS — M545 Low back pain, unspecified: Secondary | ICD-10-CM

## 2016-07-04 DIAGNOSIS — M62838 Other muscle spasm: Secondary | ICD-10-CM | POA: Diagnosis not present

## 2016-07-04 NOTE — Therapy (Signed)
[facility] [address] [place], [place], [address] Phone: [phone]   Fax:  [phone]  Physical Therapy Evaluation  Patient Details  Name: Brian Fitzpatrick MRN: [id number] Date of Birth: [birth year] Referring Provider: Dr. Erline Levine  Encounter Date: 07/04/2016      PT End of Session - 07/04/16 1119    Visit Number 1   Number of Visits 10   Date for PT Re-Evaluation 08/29/16   PT Start Time 0930   PT Stop Time 1015   PT Time Calculation (min) 45 min   Activity Tolerance Patient tolerated treatment well   Behavior During Therapy [facility] for tasks assessed/performed      Past Medical History:  Diagnosis Date  . Arthritis   . BPH (benign prostatic hyperplasia)   . Diabetes mellitus    Borderline  . Vertigo    left ear sets it off/ has "crystals" in it    Past Surgical History:  Procedure Laterality Date  . HERNIA REPAIR    . KNEE SURGERY    . SHOULDER SURGERY      There were no vitals filed for this visit.       Subjective Assessment - 07/04/16 0949    Subjective Patient has had chronic back pain since 1965.  Patient reports 6 months ago unable to adjust his back and has constant pain.  Patient will stretch in the morning. Patient will use a heating pad for the pain.  Then patient will walk for 3 miles. Patient will sit a work that will increase pain.    Limitations Sitting   How long can you sit comfortably? 10 min   Patient Stated Goals want to bring spine back into alignment   Currently in Pain? Yes   Pain Score 8    Pain Location Back   Pain Orientation Mid   Pain Descriptors / Indicators Aching   Pain Type Chronic pain   Pain Radiating Towards None   Pain Onset More than a month ago   Pain Frequency Constant   Aggravating Factors  sitting; morning; golfing   Pain Relieving Factors heat, stretches; laying down; chi maching   Multiple Pain Sites No            [facility] PT Assessment -  07/04/16 0001      Assessment   Medical Diagnosis M54.16 Radiculopathy, lumbar region   Referring Provider Dr. Erline Levine   Onset Date/Surgical Date 12/14/15   Prior Therapy none     Precautions   Precautions None     Restrictions   Weight Bearing Restrictions No     Balance Screen   Has the patient fallen in the past 6 months No   Has the patient had a decrease in activity level because of a fear of falling?  No   Is the patient reluctant to leave their home because of a fear of falling?  No     Home Ecologist residence     Prior Function   Level of Independence Independent     Cognition   Overall Cognitive Status Within Functional Limits for tasks assessed     Observation/Other Assessments   Focus on Therapeutic Outcomes (FOTO)  47% lmitation  40% limitation goal     Posture/Postural Control   Posture/Postural Control Postural limitations   Postural Limitations Decreased lumbar lordosis  leans to the right; hips to the left     ROM / Strength  AROM / PROM / Strength AROM;Strength     AROM   AROM Assessment Site Lumbar   Lumbar Flexion decreased by 25% with deviation to left   Lumbar Extension decreased by 75%   Lumbar - Right Side Bend decreased by 50%   Lumbar - Left Side Bend decreased by 50%   Lumbar - Right Rotation decreased by 50%   Lumbar - Left Rotation decreased by 75%     Palpation   Spinal mobility decreased mobility of L1-L5   SI assessment  pelvis in correct alignment   Palpation comment Tenderness located in bil. quadratus and lumbar paraspinals; tightenss in lumbar multifidi                   [facility] Adult PT Treatment/Exercise - 07/04/16 0001      Manual Therapy   Manual Therapy Soft tissue mobilization;Joint mobilization   Joint Mobilization P-A and rotational mobilization grade 3 to L1-L5 in prone   Soft tissue mobilization bil. quadratus in prone x 8 min                PT Education  - 07/04/16 1119    Education provided Yes   Education Details lumbar extension in standing   Person(s) Educated Patient   Methods Explanation;Demonstration;Verbal cues;Handout   Comprehension Verbalized understanding;Returned demonstration          PT Short Term Goals - 07/04/16 1128      PT SHORT TERM GOAL #1   Title independent with initial HEP   Time 4   Period Weeks   Status New     PT SHORT TERM GOAL #2   Title pain is intermittent instead of constant due to increased lumbar mobility   Time 4   Period Weeks   Status New           PT Long Term Goals - 07/04/16 1129      PT LONG TERM GOAL #1   Title independent with HEP and understand how to progress    Time 8   Period Weeks   Status New     PT LONG TERM GOAL #2   Title pain with sitting reduced >/= 50% due to reduction in muscle tightness   Time 8   Period Weeks   Status New     PT LONG TERM GOAL #3   Title pain in morning decreased >/= 50% due to increased lumbar mobility   Time 8   Period Weeks   Status New     PT LONG TERM GOAL #4   Title FOTO score is </= 40% limitation   Time 8   Period Weeks   Status New               Plan - 07/04/16 1120    Clinical Impression Statement Patient is a 82 year old male with lumbar pain that is chronic. Patient reports pain has been constant for 6 months.  Constant lumbar pain at level 8/10 and worse with sitting, golfing and first thing in the morning.  Pain is better with morning routine of stretches, heat and walking.  Lumbar ROM is limited by 50% -75%.  Lumbar flexion deviates to the right.  Decreased movement of L1-L5.  Tenderness located in bil. quadratus lumborum.  Tightness in bil. lumbar paraspinals and multifidi.  Patient is of low complexity due to no co-morbidities to affect him.  Patient will benefit form skilled therapy to reduce pain and improve mobility.  Rehab Potential Excellent   Clinical Impairments Affecting Rehab Potential None   PT  Frequency 1x / week   PT Duration 8 weeks   PT Treatment/Interventions Electrical Stimulation;Cryotherapy;Ultrasound;Traction;Moist Heat;Therapeutic activities;Therapeutic exercise;Neuromuscular re-education;Patient/family education;Passive range of motion;Manual techniques;Dry needling   PT Next Visit Plan joint mobilization to lumbar; dry needling to lumbar mutifidi and quadratus; Patient asks many questions and not sure about therapy   PT Home Exercise Plan stretches and body mechanics   Recommended Other Services None   Consulted and Agree with Plan of Care Patient      Patient will benefit from skilled therapeutic intervention in order to improve the following deficits and impairments:  Decreased range of motion, Increased fascial restricitons, Increased muscle spasms, Decreased mobility, Hypomobility, Impaired flexibility, Decreased activity tolerance  Visit Diagnosis: Other muscle spasm - Plan: PT plan of care cert/re-cert  Bilateral low back pain without sciatica - Plan: PT plan of care cert/re-cert      G-Codes - [id number]    Functional Assessment Tool Used FOTO score is 47% limitation  goal is 40% limitation   Functional Limitation Other PT primary   Other PT Primary Current Status [id number]) At least 40 percent but less than 60 percent impaired, limited or restricted   Other PT Primary Goal Status [id number]) At least 40 percent but less than 60 percent impaired, limited or restricted   Other PT Primary Discharge Status [id number]) At least 40 percent but less than 60 percent impaired, limited or restricted       Problem List Patient Active Problem List   Diagnosis Date Noted  . Type 2 diabetes mellitus with circulatory disorder ([place]) 09/16/2014  . Atypical chest pain 08/19/2014  . Impaired fasting glucose 08/19/2014  . Dyslipidemia 08/19/2014  . Syncope 10/22/2011  . Chest pain with moderate risk of acute coronary syndrome 10/22/2011  . BRONCHOPNEUMONIA ORGANISM  UNSPECIFIED 12/26/2007  . TOBACCO ABUSE, HX OF 12/26/2007    Earlie Counts, PT 07/04/16 11:36 AM   [facility] [address], [place], [place], [address] Phone: [phone]   Fax:  [phone]  Name: MELACHI TEES MRN: [id number] Date of Birth: 04-29-1934

## 2016-07-04 NOTE — Patient Instructions (Signed)
   Hands on the hips, keeping your gaze forward, gently press your hips forward to tolerance and return to neutral spinal position. 5 times 4 times per day.  [place] [address] [place], [place] [address] Phone # [phone] Fax [phone]

## 2016-07-13 ENCOUNTER — Ambulatory Visit: Payer: PPO | Attending: Neurosurgery | Admitting: Physical Therapy

## 2016-07-13 DIAGNOSIS — M545 Low back pain, unspecified: Secondary | ICD-10-CM

## 2016-07-13 DIAGNOSIS — M62838 Other muscle spasm: Secondary | ICD-10-CM | POA: Diagnosis not present

## 2016-07-13 NOTE — Patient Instructions (Signed)
PT [facility] [address], [address] [place], [place] [address] Phone # [phone] Fax [phone]    

## 2016-07-13 NOTE — Therapy (Signed)
[facility] [address], [place], [place], [address] Phone: [phone]   Fax:  [phone]  Physical Therapy Treatment  Patient Details  Name: Brian Fitzpatrick MRN: [id number] Date of Birth: 09/26/1934 Referring Provider: Dr. Erline Levine  Encounter Date: 07/13/2016      PT End of Session - 07/13/16 1222    Visit Number 2   Number of Visits 10   Date for PT Re-Evaluation 08/29/16   PT Start Time 0932   PT Stop Time 1017   PT Time Calculation (min) 45 min   Activity Tolerance Patient tolerated treatment well      Past Medical History:  Diagnosis Date  . Arthritis   . BPH (benign prostatic hyperplasia)   . Diabetes mellitus    Borderline  . Vertigo    left ear sets it off/ has "crystals" in it    Past Surgical History:  Procedure Laterality Date  . HERNIA REPAIR    . KNEE SURGERY    . SHOULDER SURGERY      There were no vitals filed for this visit.      Subjective Assessment - 07/13/16 0933    Subjective (P)  It helped last time Wed.  I played golf on Thursday and it hurt.  I use [employer] and wears a support brace.  Very painful in the mornings,  and then do HS, calf stretch.  Uses Chi machine which oscillates legs while using heat.  Walks 3 miles.  Does all that before playing    Currently in Pain? (P)  Yes   Pain Location (P)  Back   Pain Orientation (P)  Lower   Pain Type (P)  Chronic pain                         [facility] Adult PT Treatment/Exercise - 07/13/16 0001      Self-Care   Posture proper technique with getting in/out of bed (log roll);  use of lumbar roll when sitting   Other Self-Care Comments  Extensive discussion on lumbar stenosis and effect on symptoms/positioning     Exercises   Exercises --  extensive discussion on his current HEP and gym program     Lumbar Exercises: Supine   Ab Set 10 reps   Bent Knee Raise 10 reps   Isometric Hip Flexion 10 reps                 PT Education - 07/13/16 1014    Education provided Yes   Education Details ab brace series 1,2,3   Person(s) Educated Patient   Methods Explanation;Demonstration;Handout   Comprehension Verbalized understanding;Returned demonstration          PT Short Term Goals - 07/13/16 1229      PT SHORT TERM GOAL #1   Title independent with initial HEP   Time 4   Period Weeks   Status On-going     PT SHORT TERM GOAL #2   Title pain is intermittent instead of constant due to increased lumbar mobility   Time 4   Period Weeks   Status On-going           PT Long Term Goals - 07/13/16 1229      PT LONG TERM GOAL #1   Title independent with HEP and understand how to progress    Time 8   Period Weeks   Status On-going     PT LONG  TERM GOAL #2   Title pain with sitting reduced >/= 50% due to reduction in muscle tightness   Time 8   Period Weeks   Status On-going     PT LONG TERM GOAL #3   Title pain in morning decreased >/= 50% due to increased lumbar mobility   Time 8   Period Weeks   Status On-going     PT LONG TERM GOAL #4   Title FOTO score is </= 40% limitation   Time 8   Period Weeks   Status On-going               Plan - 07/13/16 1222    Clinical Impression Statement Extensive patient education performed on spinal anatomy related to his diagnosis, proper body mechanics and positioning,  appropriate HEP and gym program to optimize improvements.  Instructed in abdominal brace series/transvere abdominal activation.  Also discussed other interventions available including dry needling to address myofascial concerns.  He states he doesn't want to try this "unless we know for sure it will help."  The patient does not wish to schedule any follow up appointments and will call if he wishes to pursue further instruction in core strengthening as recommended.     PT Next Visit Plan core strengthening progression;  joint mob to lumbar and hips;  HS  stretching in supine      Patient will benefit from skilled therapeutic intervention in order to improve the following deficits and impairments:     Visit Diagnosis: Other muscle spasm  Bilateral low back pain without sciatica     Problem List Patient Active Problem List   Diagnosis Date Noted  . Type 2 diabetes mellitus with circulatory disorder ([place]) 09/16/2014  . Atypical chest pain 08/19/2014  . Impaired fasting glucose 08/19/2014  . Dyslipidemia 08/19/2014  . Syncope 10/22/2011  . Chest pain with moderate risk of acute coronary syndrome 10/22/2011  . BRONCHOPNEUMONIA ORGANISM UNSPECIFIED 12/26/2007  . TOBACCO ABUSE, HX OF 12/26/2007   Ruben Im, PT 07/13/16 12:31 PM Phone: [phone] Fax: [phone]  Alvera Singh 07/13/2016, 12:30 PM  [facility] [address] [place], [place], [address] Phone: [phone]   Fax:  [phone]  Name: Brian Fitzpatrick MRN: [id number] Date of Birth: 1934/05/02

## 2016-07-30 DIAGNOSIS — N400 Enlarged prostate without lower urinary tract symptoms: Secondary | ICD-10-CM | POA: Diagnosis not present

## 2016-08-14 ENCOUNTER — Encounter: Payer: PPO | Admitting: Physical Therapy

## 2016-08-15 ENCOUNTER — Encounter: Payer: Self-pay | Admitting: Physical Therapy

## 2016-08-15 ENCOUNTER — Ambulatory Visit: Payer: PPO | Attending: Neurosurgery | Admitting: Physical Therapy

## 2016-08-15 DIAGNOSIS — G8929 Other chronic pain: Secondary | ICD-10-CM | POA: Insufficient documentation

## 2016-08-15 DIAGNOSIS — M62838 Other muscle spasm: Secondary | ICD-10-CM | POA: Diagnosis not present

## 2016-08-15 DIAGNOSIS — M545 Low back pain, unspecified: Secondary | ICD-10-CM

## 2016-08-15 NOTE — Patient Instructions (Addendum)
Quads / HF, Supine    Lie near edge of bed, one leg bent, foot flat on bed. Other leg hanging over edge, relaxed, thigh resting entirely on bed. Bend hanging knee backward keeping thigh in contact with bed. Hold 30___ seconds. Use a strap to pull on leg you are stretching. Repeat __2_ times per session. Do _1__ sessions per day.  Copyright  VHI. All rights reserved.    Adductors, Sitting With Hip Flexion    Sit with legs open in a wide V, toes pointing up, hands on knees. Keep spine straight supporting trunk with arms. Slide arms down leg as trunk tips forward. Press knees apart. Hold _30__ seconds. Repeat _2__ times per session. Do __1_ sessions per day.  Copyright  VHI. All rights reserved.  Pollyann Glen From [employer], 2-Pack, Massage Tools, Beautiful Hand-Made, Friction Reduced, Traditional Chinese Scrubbing for Therapeutic Relief and Skin Renewal. Start Scrubbing Now!  [facility] [address], [place], [place] [address] Phone # [phone] Fax [phone]

## 2016-08-15 NOTE — Therapy (Signed)
[facility] [address] [place], [place], [address] Phone: [phone]   Fax:  [phone]  Physical Therapy Treatment  Patient Details  Name: Brian Fitzpatrick MRN: [id number] Date of Birth: 03-10-1934 Referring Provider: Dr. Erline Levine  Encounter Date: 08/15/2016      PT End of Session - 08/15/16 0942    Visit Number 3   Number of Visits 10   Date for PT Re-Evaluation 08/29/16   PT Start Time 0932   PT Stop Time 1020   PT Time Calculation (min) 48 min   Activity Tolerance Patient tolerated treatment well   Behavior During Therapy [facility] for tasks assessed/performed      Past Medical History:  Diagnosis Date  . Arthritis   . BPH (benign prostatic hyperplasia)   . Diabetes mellitus    Borderline  . Vertigo    left ear sets it off/ has "crystals" in it    Past Surgical History:  Procedure Laterality Date  . HERNIA REPAIR    . KNEE SURGERY    . SHOULDER SURGERY      There were no vitals filed for this visit.      Subjective Assessment - 08/15/16 0940    Subjective I feel the same.  I did a fast walk and had soreness in the thighs.    Limitations Sitting   How long can you sit comfortably? 10 min   Patient Stated Goals want to bring spine back into alignment   Currently in Pain? Yes   Pain Score 8   10/10 in morning   Pain Location Back   Pain Orientation Mid   Pain Descriptors / Indicators Aching   Pain Type Chronic pain   Pain Onset More than a month ago   Pain Frequency Constant   Aggravating Factors  sitting, morning, golfing   Pain Relieving Factors heat, stretches, laying down, chi machine   Multiple Pain Sites No                         [facility] Adult PT Treatment/Exercise - 08/15/16 0001      Lumbar Exercises: Stretches   Active Hamstring Stretch 30 seconds;1 rep  bil.      Manual Therapy   Manual Therapy Soft tissue mobilization;Joint mobilization;Myofascial release    Joint Mobilization P-A and rotational mobilization grade 3 to L1-L5 in prone; bil. posterior mobilization with breathing   Soft tissue mobilization bil. lumbar paraspinals and lateral abdominal wall in quadruped   Myofascial Release using an assistive device for fascia release in the lumbar paraspinals, lateral abdominal fascia restriction                PT Education - 08/15/16 1020    Education provided Yes   Education Details stretches   Person(s) Educated Patient   Methods Explanation;Demonstration;Handout   Comprehension Verbalized understanding;Returned demonstration          PT Short Term Goals - 08/15/16 0943      PT SHORT TERM GOAL #1   Title independent with initial HEP   Time 4   Period Weeks   Status Achieved     PT SHORT TERM GOAL #2   Title pain is intermittent instead of constant due to increased lumbar mobility   Time 4   Period Weeks   Status On-going  level of pain that is constant           PT Long Term  Goals - 07/13/16 1229      PT LONG TERM GOAL #1   Title independent with HEP and understand how to progress    Time 8   Period Weeks   Status On-going     PT LONG TERM GOAL #2   Title pain with sitting reduced >/= 50% due to reduction in muscle tightness   Time 8   Period Weeks   Status On-going     PT LONG TERM GOAL #3   Title pain in morning decreased >/= 50% due to increased lumbar mobility   Time 8   Period Weeks   Status On-going     PT LONG TERM GOAL #4   Title FOTO score is </= 40% limitation   Time 8   Period Weeks   Status On-going               Plan - 08/15/16 1024    Clinical Impression Statement Patient has tightness in lumbar spine and rib cage.  Patient is learning ways to release the tissue and stretch to improve mobility.  Patient has not met goals yet.  Patient reports decreased in pain after therapy.  Patient will benefit from skilled therapy to release tissue and increase mobility.    Rehab  Potential Excellent   Clinical Impairments Affecting Rehab Potential None   PT Frequency 1x / week   PT Duration 8 weeks   PT Treatment/Interventions Electrical Stimulation;Cryotherapy;Ultrasound;Traction;Moist Heat;Therapeutic activities;Therapeutic exercise;Neuromuscular re-education;Patient/family education;Passive range of motion;Manual techniques;Dry needling   PT Next Visit Plan core strengthening progression;  joint mob to lumbar and hips;  HS stretching in supine; physioball exercises; write renewal   PT Home Exercise Plan  body mechanics   Consulted and Agree with Plan of Care Patient      Patient will benefit from skilled therapeutic intervention in order to improve the following deficits and impairments:  Decreased range of motion, Increased fascial restricitons, Increased muscle spasms, Decreased mobility, Hypomobility, Impaired flexibility, Decreased activity tolerance  Visit Diagnosis: Other muscle spasm  Chronic bilateral low back pain without sciatica     Problem List Patient Active Problem List   Diagnosis Date Noted  . Type 2 diabetes mellitus with circulatory disorder ([place]) 09/16/2014  . Atypical chest pain 08/19/2014  . Impaired fasting glucose 08/19/2014  . Dyslipidemia 08/19/2014  . Syncope 10/22/2011  . Chest pain with moderate risk of acute coronary syndrome 10/22/2011  . BRONCHOPNEUMONIA ORGANISM UNSPECIFIED 12/26/2007  . TOBACCO ABUSE, HX OF 12/26/2007    Earlie Counts, PT 08/15/16 10:29 AM   [facility] [address] [place], [place], [address] Phone: [phone]   Fax:  [phone]  Name: Brian Fitzpatrick MRN: [id number] Date of Birth: [birth year]

## 2016-08-22 ENCOUNTER — Ambulatory Visit: Payer: PPO | Admitting: Physical Therapy

## 2016-08-22 ENCOUNTER — Encounter: Payer: Self-pay | Admitting: Physical Therapy

## 2016-08-22 DIAGNOSIS — G8929 Other chronic pain: Secondary | ICD-10-CM

## 2016-08-22 DIAGNOSIS — M62838 Other muscle spasm: Secondary | ICD-10-CM

## 2016-08-22 DIAGNOSIS — M545 Low back pain: Secondary | ICD-10-CM

## 2016-08-22 NOTE — Therapy (Signed)
[facility] [address], [place], [place], [address] Phone: [phone]   Fax:  [phone]  Physical Therapy Treatment  Patient Details  Name: Brian Fitzpatrick MRN: [id number] Date of Birth: 10-04-1934 Referring Provider: Dr. Erline Levine  Encounter Date: 08/22/2016      PT End of Session - 08/22/16 1009    Visit Number 4   Number of Visits 10   Date for PT Re-Evaluation 10/24/16   Authorization Type medicare g-code on 10th visit   PT Start Time 0930   PT Stop Time 1010   PT Time Calculation (min) 40 min   Activity Tolerance Patient tolerated treatment well   Behavior During Therapy [facility] for tasks assessed/performed      Past Medical History:  Diagnosis Date  . Arthritis   . BPH (benign prostatic hyperplasia)   . Diabetes mellitus    Borderline  . Vertigo    left ear sets it off/ has "crystals" in it    Past Surgical History:  Procedure Laterality Date  . HERNIA REPAIR    . KNEE SURGERY    . SHOULDER SURGERY      There were no vitals filed for this visit.      Subjective Assessment - 08/22/16 0930    Subjective I hurt my back playing golf.  I was tight yesterday.  I stretched prior to golf. I felt good for 5 days after therapy.    Limitations Sitting   How long can you sit comfortably? 10 min   Patient Stated Goals want to bring spine back into alignment   Currently in Pain? Yes   Pain Score 6    Pain Location Back   Pain Orientation Mid   Pain Descriptors / Indicators Aching   Pain Type Chronic pain   Pain Onset More than a month ago   Pain Frequency Intermittent   Aggravating Factors  bending over, sitting, morning, golfing   Pain Relieving Factors heat, stretches, laying down, chi machine   Multiple Pain Sites No            [facility] PT Assessment - 08/22/16 0001      Assessment   Medical Diagnosis M54.16 Radiculopathy, lumbar region   Referring Provider Dr. Erline Levine   Onset  Date/Surgical Date 12/14/15   Prior Therapy none     Precautions   Precautions None     Restrictions   Weight Bearing Restrictions No     Balance Screen   Has the patient fallen in the past 6 months No   Has the patient had a decrease in activity level because of a fear of falling?  No   Is the patient reluctant to leave their home because of a fear of falling?  No     Home Ecologist residence     Prior Function   Level of Independence Independent     Cognition   Overall Cognitive Status Within Functional Limits for tasks assessed     Observation/Other Assessments   Focus on Therapeutic Outcomes (FOTO)  47% lmitation  40% limitation goal     Posture/Postural Control   Posture/Postural Control Postural limitations   Postural Limitations Decreased lumbar lordosis  leans to the right; hips to the left     ROM / Strength   AROM / PROM / Strength AROM;Strength     AROM   AROM Assessment Site Lumbar   Lumbar Flexion full   Lumbar Extension  decreased by 50%   Lumbar - Right Side Bend decreased by 50%   Lumbar - Left Side Bend decreased by 25%   Lumbar - Right Rotation full   Lumbar - Left Rotation full     Palpation   SI assessment  pelvis in correct alignment                     [facility] Adult PT Treatment/Exercise - 08/22/16 0001      Lumbar Exercises: Stretches   Active Hamstring Stretch 60 seconds;2 reps     Manual Therapy   Manual Therapy Soft tissue mobilization   Joint Mobilization P-A and rotational mobilization grade 3 to L1-L5 in prone; bil. posterior mobilization with breathing   Soft tissue mobilization bil. lumbar paraspinals and lateral abdominal wall in quadruped   Myofascial Release using an assistive device for fascia release in the lumbar paraspinals, lateral abdominal fascia restriction                PT Education - 08/22/16 1005    Education provided Yes   Education Details reviewed HEP and  stretches prior to golfing   Person(s) Educated Patient   Methods Explanation   Comprehension Verbalized understanding          PT Short Term Goals - 08/22/16 0936      PT SHORT TERM GOAL #1   Title independent with initial HEP   Time 4   Period Weeks   Status Achieved     PT SHORT TERM GOAL #2   Title pain is intermittent instead of constant due to increased lumbar mobility   Time 4   Period Weeks   Status Achieved           PT Long Term Goals - 08/22/16 0941      PT LONG TERM GOAL #1   Title independent with HEP and understand how to progress    Time 8   Period Weeks   Status On-going     PT LONG TERM GOAL #2   Title pain with sitting reduced >/= 50% due to reduction in muscle tightness   Time 8   Period Weeks   Status On-going  35% decreased     PT LONG TERM GOAL #3   Title pain in morning decreased >/= 50% due to increased lumbar mobility   Time 8   Period Weeks   Status On-going  35% better     PT LONG TERM GOAL #4   Title FOTO score is </= 40% limitation   Time 8   Period Weeks   Status On-going               Plan - 08/22/16 1006    Clinical Impression Statement Patient reports after treatment pain decreased to 1/10.  Patient reports overall pain from start of therapy is 35% better. Patient has met hsi STG's.  Patient will stretch prior to walking and golfing.  Patient has increased lumbar ROM.  Patient will benefit from skilled therapy to improve lumbar ROM for function.    Rehab Potential Excellent   Clinical Impairments Affecting Rehab Potential None   PT Frequency 1x / week   PT Duration 8 weeks   PT Treatment/Interventions Electrical Stimulation;Cryotherapy;Ultrasound;Traction;Moist Heat;Therapeutic activities;Therapeutic exercise;Neuromuscular re-education;Patient/family education;Passive range of motion;Manual techniques;Dry needling   PT Next Visit Plan core strengthening progression;  joint mob to lumbar and hips;  HS stretching  in supine; physioball exercises   PT Home Exercise Plan  progress as needed   Consulted and Agree with Plan of Care Patient      Patient will benefit from skilled therapeutic intervention in order to improve the following deficits and impairments:  Decreased range of motion, Increased fascial restricitons, Increased muscle spasms, Decreased mobility, Hypomobility, Impaired flexibility, Decreased activity tolerance  Visit Diagnosis: Other muscle spasm - Plan: PT plan of care cert/re-cert  Chronic bilateral low back pain without sciatica - Plan: PT plan of care cert/re-cert     Problem List Patient Active Problem List   Diagnosis Date Noted  . Type 2 diabetes mellitus with circulatory disorder ([place]) 09/16/2014  . Atypical chest pain 08/19/2014  . Impaired fasting glucose 08/19/2014  . Dyslipidemia 08/19/2014  . Syncope 10/22/2011  . Chest pain with moderate risk of acute coronary syndrome 10/22/2011  . BRONCHOPNEUMONIA ORGANISM UNSPECIFIED 12/26/2007  . TOBACCO ABUSE, HX OF 12/26/2007    Earlie Counts, PT 08/22/16 10:13 AM    [facility] [address], [place], [place], [address] Phone: [phone]   Fax:  [phone]  Name: DENNARD VEZINA MRN: [id number] Date of Birth: [birth year]

## 2016-09-05 ENCOUNTER — Encounter: Payer: Self-pay | Admitting: Physical Therapy

## 2016-09-05 ENCOUNTER — Ambulatory Visit: Payer: PPO | Admitting: Physical Therapy

## 2016-09-05 DIAGNOSIS — M62838 Other muscle spasm: Secondary | ICD-10-CM | POA: Diagnosis not present

## 2016-09-05 DIAGNOSIS — G8929 Other chronic pain: Secondary | ICD-10-CM

## 2016-09-05 DIAGNOSIS — M545 Low back pain: Secondary | ICD-10-CM

## 2016-09-05 NOTE — Patient Instructions (Signed)
Pollyann Glen From [place], 2-Pack, Massage Tools, Beautiful Hand-Made, Friction Reduced, Traditional Chinese Scrubbing for Therapeutic Relief and Skin...   SPEQUIX 6-Pack Face & Body Massage Cupping Therapy Set Massage Cups for Muscle Soreness Trigger Point Pain Release Toxins Cellulite Treatment Reduce...  ..    [facility] [address] [place], [place] [address] Phone # [phone] Fax [phone]

## 2016-09-05 NOTE — Therapy (Signed)
[facility] [address] [place], [place], [address] Phone: [phone]   Fax:  [phone]  Physical Therapy Treatment  Patient Details  Name: Brian Fitzpatrick MRN: [id number] Date of Birth: 09/14/1934 Referring Provider: Dr. Erline Levine  Encounter Date: 09/05/2016      PT End of Session - 09/05/16 0854    Visit Number 5   Number of Visits 10   Date for PT Re-Evaluation 10/24/16   Authorization Type medicare g-code on 10th visit   PT Start Time 0847   PT Stop Time 0925   PT Time Calculation (min) 38 min   Activity Tolerance Patient tolerated treatment well   Behavior During Therapy [facility] for tasks assessed/performed      Past Medical History:  Diagnosis Date  . Arthritis   . BPH (benign prostatic hyperplasia)   . Diabetes mellitus    Borderline  . Vertigo    left ear sets it off/ has "crystals" in it    Past Surgical History:  Procedure Laterality Date  . HERNIA REPAIR    . KNEE SURGERY    . SHOULDER SURGERY      There were no vitals filed for this visit.      Subjective Assessment - 09/05/16 0851    Subjective When I get up in the morning I have incresaed pain. I do my routine that will help the pain.  I played golf yesterday.  After 13 holes of golf my back feels tired and my golf game is not good.    Patient is accompained by: Family member  wife   Limitations Sitting   How long can you sit comfortably? 10 min   Patient Stated Goals want to bring spine back into alignment   Currently in Pain? Yes   Pain Score 5   morning is 9/10   Pain Location Back   Pain Orientation Lower;Mid   Pain Descriptors / Indicators Aching   Pain Type Chronic pain   Pain Onset More than a month ago   Aggravating Factors  morning, bending over, sitting, golfing   Pain Relieving Factors heat, stretches, laying down, chi machine   Multiple Pain Sites No                         [facility] Adult PT  Treatment/Exercise - 09/05/16 0001      Manual Therapy   Manual Therapy Soft tissue mobilization   Joint Mobilization P-A and rotational mobilization grade 3 to L1-L5 in prone; bil. posterior mobilization with breathing   Soft tissue mobilization soft tissue work to lumbar paraspinals   Myofascial Release using an assistive device for fascia release in the lumbar paraspinals, lateral abdominal fascia restriction; instructed patient wife on how to use assistive device for home use                PT Education - 09/05/16 0921    Education Details tissue work with assistive device   Person(s) Educated Patient;Spouse   Methods Explanation;Demonstration;Handout   Comprehension Verbalized understanding;Returned demonstration          PT Short Term Goals - 08/22/16 0936      PT SHORT TERM GOAL #1   Title independent with initial HEP   Time 4   Period Weeks   Status Achieved     PT SHORT TERM GOAL #2   Title pain is intermittent instead of constant due to increased lumbar mobility  Time 4   Period Weeks   Status Achieved           PT Long Term Goals - 09/05/16 [id number]      PT LONG TERM GOAL #1   Title independent with HEP and understand how to progress    Time 8     PT LONG TERM GOAL #2   Title pain with sitting reduced >/= 50% due to reduction in muscle tightness   Time 8   Period Weeks   Status On-going     PT LONG TERM GOAL #3   Title pain in morning decreased >/= 50% due to increased lumbar mobility   Time 8   Period Weeks   Status On-going     PT LONG TERM GOAL #4   Title FOTO score is </= 40% limitation   Time 8   Period Weeks   Status On-going               Plan - 09/05/16 [id number]    Clinical Impression Statement Therapy consisted of educating his wife on performing soft tissue work with assistive device to do at home.  Patient wife was able to return demonstration correctly.  After therapy patient had no pain.  Patient had some tightness in  the left lower rib cage but after mobiliization and soft tissue work improved mobility.  Patient will benefit from skilled therpay to improve lumbar ROM and function.    Rehab Potential Excellent   Clinical Impairments Affecting Rehab Potential None   PT Frequency 1x / week   PT Duration 8 weeks   PT Treatment/Interventions Electrical Stimulation;Cryotherapy;Ultrasound;Traction;Moist Heat;Therapeutic activities;Therapeutic exercise;Neuromuscular re-education;Patient/family education;Passive range of motion;Manual techniques;Dry needling   PT Next Visit Plan core strengthening progression;  joint mob to lumbar and hips;  HS stretching in supine; physioball exercises   PT Home Exercise Plan  progress as needed   Consulted and Agree with Plan of Care Patient      Patient will benefit from skilled therapeutic intervention in order to improve the following deficits and impairments:  Decreased range of motion, Increased fascial restricitons, Increased muscle spasms, Decreased mobility, Hypomobility, Impaired flexibility, Decreased activity tolerance  Visit Diagnosis: Other muscle spasm  Chronic bilateral low back pain without sciatica     Problem List Patient Active Problem List   Diagnosis Date Noted  . Type 2 diabetes mellitus with circulatory disorder ([place]) 09/16/2014  . Atypical chest pain 08/19/2014  . Impaired fasting glucose 08/19/2014  . Dyslipidemia 08/19/2014  . Syncope 10/22/2011  . Chest pain with moderate risk of acute coronary syndrome 10/22/2011  . BRONCHOPNEUMONIA ORGANISM UNSPECIFIED 12/26/2007  . TOBACCO ABUSE, HX OF 12/26/2007    Earlie Counts, PT 09/05/16 9:29 AM   [facility] [address] [place], [place], [address] Phone: [phone]   Fax:  [phone]  Name: Brian Fitzpatrick MRN: [id number] Date of Birth: 05/11/1934

## 2016-09-25 ENCOUNTER — Ambulatory Visit: Payer: PPO | Attending: Neurosurgery | Admitting: Physical Therapy

## 2016-09-25 ENCOUNTER — Encounter: Payer: Self-pay | Admitting: Physical Therapy

## 2016-09-25 DIAGNOSIS — G8929 Other chronic pain: Secondary | ICD-10-CM | POA: Diagnosis not present

## 2016-09-25 DIAGNOSIS — M545 Low back pain: Secondary | ICD-10-CM | POA: Diagnosis not present

## 2016-09-25 DIAGNOSIS — M62838 Other muscle spasm: Secondary | ICD-10-CM | POA: Insufficient documentation

## 2016-09-25 NOTE — Therapy (Signed)
[facility] [address], [place], [place], [address] Phone: [phone]   Fax:  [phone]  Physical Therapy Treatment  Patient Details  Name: Brian Fitzpatrick MRN: [id number] Date of Birth: 1934-06-14 Referring Provider: Dr. Erline Levine  Encounter Date: 09/25/2016      PT End of Session - 09/25/16 0922    Visit Number 6   Number of Visits 10   Date for PT Re-Evaluation 10/24/16   Authorization Type medicare g-code on 10th visit   PT Start Time 0845   PT Stop Time 0923   PT Time Calculation (min) 38 min   Activity Tolerance Patient tolerated treatment well   Behavior During Therapy [facility] for tasks assessed/performed      Past Medical History:  Diagnosis Date  . Arthritis   . BPH (benign prostatic hyperplasia)   . Diabetes mellitus    Borderline  . Vertigo    left ear sets it off/ has "crystals" in it    Past Surgical History:  Procedure Laterality Date  . HERNIA REPAIR    . KNEE SURGERY    . SHOULDER SURGERY      There were no vitals filed for this visit.      Subjective Assessment - 09/25/16 0852    Subjective Today is the best day.  I have not gotten the devices to massage my back.    Limitations Sitting   How long can you sit comfortably? 10 min   Patient Stated Goals want to bring spine back into alignment   Currently in Pain? Yes   Pain Score 4    Pain Location Back   Pain Orientation Lower;Mid   Pain Descriptors / Indicators Aching   Pain Type Chronic pain   Pain Radiating Towards none   Pain Onset More than a month ago   Pain Frequency Intermittent   Aggravating Factors  morning, bending over, sitting, golfing   Pain Relieving Factors heat, stretches, laying down, chi machine   Multiple Pain Sites No                         [facility] Adult PT Treatment/Exercise - 09/25/16 0001      Manual Therapy   Manual Therapy Soft tissue mobilization   Joint Mobilization P-A and  rotational mobilization grade 3 to L1-L5 in prone; bil. posterior mobilization with breathing   Soft tissue mobilization bil. lumbar paraspinals and lateral abdominal wall in quadruped   Myofascial Release myofascial release for distraction of the spine in prone; using an assistive device for fascia release in the lumbar paraspinals, lateral abdominal fascia restriction                  PT Short Term Goals - 08/22/16 0936      PT SHORT TERM GOAL #1   Title independent with initial HEP   Time 4   Period Weeks   Status Achieved     PT SHORT TERM GOAL #2   Title pain is intermittent instead of constant due to increased lumbar mobility   Time 4   Period Weeks   Status Achieved           PT Long Term Goals - 09/25/16 0920      PT LONG TERM GOAL #1   Title independent with HEP and understand how to progress    Time 8   Period Weeks   Status On-going     PT  LONG TERM GOAL #2   Title pain with sitting reduced >/= 50% due to reduction in muscle tightness   Time 8   Period Weeks   Status On-going  15% better     PT LONG TERM GOAL #3   Title pain in morning decreased >/= 50% due to increased lumbar mobility   Time 8   Period Weeks   Status On-going  had increased soreness for several days     PT LONG TERM GOAL #4   Title FOTO score is </= 40% limitation   Time 8   Period Weeks   Status On-going               Plan - 09/25/16 [id number]    Clinical Impression Statement Patient continues to have tightness and soreness in lumbar.  Patient has a routine in the morning to reduce his pain and manage it.  Patient is able to tie his shoes with greater ease after therapy.  Patient will benefit from skilled therapy to improve lumbar ROM and function.    Rehab Potential Excellent   Clinical Impairments Affecting Rehab Potential None   PT Frequency 1x / week   PT Duration 8 weeks   PT Treatment/Interventions Electrical Stimulation;Cryotherapy;Ultrasound;Traction;Moist  Heat;Therapeutic activities;Therapeutic exercise;Neuromuscular re-education;Patient/family education;Passive range of motion;Manual techniques;Dry needling   PT Next Visit Plan  joint mob to lumbar and hips;  HS stretching in supine; physioball exercises   PT Home Exercise Plan  progress as needed   Consulted and Agree with Plan of Care Patient      Patient will benefit from skilled therapeutic intervention in order to improve the following deficits and impairments:  Decreased range of motion, Increased fascial restricitons, Increased muscle spasms, Decreased mobility, Hypomobility, Impaired flexibility, Decreased activity tolerance  Visit Diagnosis: Other muscle spasm  Chronic bilateral low back pain without sciatica     Problem List Patient Active Problem List   Diagnosis Date Noted  . Type 2 diabetes mellitus with circulatory disorder ([place]) 09/16/2014  . Atypical chest pain 08/19/2014  . Impaired fasting glucose 08/19/2014  . Dyslipidemia 08/19/2014  . Syncope 10/22/2011  . Chest pain with moderate risk of acute coronary syndrome 10/22/2011  . BRONCHOPNEUMONIA ORGANISM UNSPECIFIED 12/26/2007  . TOBACCO ABUSE, HX OF 12/26/2007    Brian Fitzpatrick, PT 09/25/16 9:25 AM   [facility] [facility] [address] [place], [place], [address] Phone: [phone]   Fax:  [phone]  Name: Brian Fitzpatrick MRN: [id number] Date of Birth: 1933/11/22

## 2016-10-01 ENCOUNTER — Encounter: Payer: Self-pay | Admitting: Physical Therapy

## 2016-10-01 ENCOUNTER — Ambulatory Visit: Payer: PPO | Admitting: Physical Therapy

## 2016-10-01 DIAGNOSIS — M62838 Other muscle spasm: Secondary | ICD-10-CM | POA: Diagnosis not present

## 2016-10-01 DIAGNOSIS — G8929 Other chronic pain: Secondary | ICD-10-CM

## 2016-10-01 DIAGNOSIS — M545 Low back pain: Secondary | ICD-10-CM

## 2016-10-01 NOTE — Therapy (Signed)
[facility] [address], [place], [place], [address] Phone: [phone]   Fax:  [phone]  Physical Therapy Treatment  Patient Details  Name: Brian Fitzpatrick MRN: [id number] Date of Birth: March 10, 1934 Referring Provider: Dr. Erline Levine  Encounter Date: 10/01/2016      PT End of Session - 10/01/16 0925    Visit Number 7   Number of Visits 10   Date for PT Re-Evaluation 10/24/16   Authorization Type medicare g-code on 10th visit   PT Start Time 0845   PT Stop Time 0925   PT Time Calculation (min) 40 min   Activity Tolerance Patient tolerated treatment well   Behavior During Therapy [facility] for tasks assessed/performed      Past Medical History:  Diagnosis Date  . Arthritis   . BPH (benign prostatic hyperplasia)   . Diabetes mellitus    Borderline  . Vertigo    left ear sets it off/ has "crystals" in it    Past Surgical History:  Procedure Laterality Date  . HERNIA REPAIR    . KNEE SURGERY    . SHOULDER SURGERY      There were no vitals filed for this visit.      Subjective Assessment - 10/01/16 0851    Subjective I have a new symptom with pain down my leg that started Saturday. I get shooting pain in left leg with stepping forward with left leg.    Limitations Sitting   How long can you sit comfortably? 10 min   Patient Stated Goals want to bring spine back into alignment   Currently in Pain? Yes   Pain Score 4    Pain Location Leg   Pain Orientation Left   Pain Descriptors / Indicators Shooting   Pain Type Acute pain   Pain Onset In the past 7 days   Pain Frequency Intermittent   Aggravating Factors  bringing left leg forward   Pain Relieving Factors not stepping   Multiple Pain Sites No            [facility] PT Assessment - 10/01/16 0001      Special Tests    Special Tests --  negative straight leg raise on left                     [facility] Adult PT Treatment/Exercise -  10/01/16 0001      Manual Therapy   Manual Therapy Neural Stretch;Joint mobilization;Soft tissue mobilization   Manual therapy comments manually stretched left piriformis, bil. quadriceps,  left lateral hip, left quadratus   Soft tissue mobilization bil. lumbar and thoracic soft tissue work with tissue rolling   Neural Stretch neural stretch of left leg                 PT Education - 10/01/16 0924    Education provided No          PT Short Term Goals - 08/22/16 0936      PT SHORT TERM GOAL #1   Title independent with initial HEP   Time 4   Period Weeks   Status Achieved     PT SHORT TERM GOAL #2   Title pain is intermittent instead of constant due to increased lumbar mobility   Time 4   Period Weeks   Status Achieved           PT Long Term Goals - 10/01/16 [id number]      PT  LONG TERM GOAL #1   Title independent with HEP and understand how to progress    Time 8   Period Weeks   Status On-going  still learning     PT LONG TERM GOAL #2   Title pain with sitting reduced >/= 50% due to reduction in muscle tightness   Time 8   Period Weeks   Status On-going  new pain in left leg     PT LONG TERM GOAL #4   Title FOTO score is </= 40% limitation   Time 8   Period Weeks   Status On-going               Plan - 10/01/16 0925    Clinical Impression Statement Patient has a new symptom.  When he steps forward with left leg, he gets a shooting pain in left leg.  Patient has tight quads, piriformis, trunk rotators, and lateral hip muscles.  Patient had no pain after therapy.  Patient is progressing toward goals.  Patient will benefit from skilled therapy to improve lumbar ROM and function.    Rehab Potential Excellent   Clinical Impairments Affecting Rehab Potential None   PT Frequency 1x / week   PT Duration 8 weeks   PT Treatment/Interventions Electrical Stimulation;Cryotherapy;Ultrasound;Traction;Moist Heat;Therapeutic activities;Therapeutic  exercise;Neuromuscular re-education;Patient/family education;Passive range of motion;Manual techniques;Dry needling   PT Next Visit Plan  joint mob to lumbar and hips;  HS stretching in supine; physioball exercises   PT Home Exercise Plan  progress as needed   Consulted and Agree with Plan of Care Patient      Patient will benefit from skilled therapeutic intervention in order to improve the following deficits and impairments:  Decreased range of motion, Increased fascial restricitons, Increased muscle spasms, Decreased mobility, Hypomobility, Impaired flexibility, Decreased activity tolerance  Visit Diagnosis: Other muscle spasm  Chronic bilateral low back pain without sciatica     Problem List Patient Active Problem List   Diagnosis Date Noted  . Type 2 diabetes mellitus with circulatory disorder ([place]) 09/16/2014  . Atypical chest pain 08/19/2014  . Impaired fasting glucose 08/19/2014  . Dyslipidemia 08/19/2014  . Syncope 10/22/2011  . Chest pain with moderate risk of acute coronary syndrome 10/22/2011  . BRONCHOPNEUMONIA ORGANISM UNSPECIFIED 12/26/2007  . TOBACCO ABUSE, HX OF 12/26/2007    Earlie Counts, PT 10/01/16 9:29 AM   [facility] [address] [place], [place], [address] Phone: [phone]   Fax:  [phone]  Name: Brian Fitzpatrick MRN: [id number] Date of Birth: [birth year]

## 2016-10-10 ENCOUNTER — Encounter: Payer: PPO | Admitting: Physical Therapy

## 2016-10-24 ENCOUNTER — Encounter: Payer: Self-pay | Admitting: Physical Therapy

## 2016-10-24 ENCOUNTER — Ambulatory Visit: Payer: PPO | Attending: Neurosurgery | Admitting: Physical Therapy

## 2016-10-24 DIAGNOSIS — G8929 Other chronic pain: Secondary | ICD-10-CM | POA: Diagnosis not present

## 2016-10-24 DIAGNOSIS — M545 Low back pain, unspecified: Secondary | ICD-10-CM

## 2016-10-24 DIAGNOSIS — M62838 Other muscle spasm: Secondary | ICD-10-CM | POA: Insufficient documentation

## 2016-10-24 NOTE — Therapy (Signed)
[facility] [address], [place], [place], [address] Phone: [phone]   Fax:  [phone]  Physical Therapy Treatment  Patient Details  Name: Brian Fitzpatrick MRN: [id number] Date of Birth: 1934-02-26 Referring Provider: Dr. Erline Levine  Encounter Date: 10/24/2016      PT End of Session - 10/24/16 0941    Visit Number 8   Date for PT Re-Evaluation 10/24/16   Authorization Type medicare g-code on 10th visit   PT Start Time 0942  came late   PT Stop Time 1015   PT Time Calculation (min) 33 min   Activity Tolerance Patient tolerated treatment well   Behavior During Therapy [facility] for tasks assessed/performed      Past Medical History:  Diagnosis Date  . Arthritis   . BPH (benign prostatic hyperplasia)   . Diabetes mellitus    Borderline  . Vertigo    left ear sets it off/ has "crystals" in it    Past Surgical History:  Procedure Laterality Date  . HERNIA REPAIR    . KNEE SURGERY    . SHOULDER SURGERY      There were no vitals filed for this visit.      Subjective Assessment - 10/24/16 0942    Subjective General soreness is generalized. I feel like I should have the L5 and L4 shift into alignment I should have no pain.  I have a sharp pain in left side of alignment. I have not golfed for 1.5 weeks.    Limitations Sitting   How long can you sit comfortably? 10 min   Patient Stated Goals want to bring spine back into alignment   Currently in Pain? Yes   Pain Score 4    Pain Location Back   Pain Orientation Left  L4-L5   Pain Descriptors / Indicators Sharp   Pain Type Chronic pain   Pain Radiating Towards pain radiating right and left buttocks   Pain Onset In the past 7 days   Pain Frequency Intermittent   Aggravating Factors  bringing left forward    Pain Relieving Factors stretch and chi machine and walk 3 miles            [facility] PT Assessment - 10/24/16 0001      Assessment   Medical  Diagnosis M54.16 Radiculopathy, lumbar region   Referring Provider Dr. Erline Levine   Onset Date/Surgical Date 12/14/15   Prior Therapy none     Precautions   Precautions None     Restrictions   Weight Bearing Restrictions No     Balance Screen   Has the patient fallen in the past 6 months No   Has the patient had a decrease in activity level because of a fear of falling?  No   Is the patient reluctant to leave their home because of a fear of falling?  No     Home Ecologist residence     Prior Function   Level of Independence Independent     Cognition   Overall Cognitive Status Within Functional Limits for tasks assessed     Observation/Other Assessments   Focus on Therapeutic Outcomes (FOTO)  44% limitation     Posture/Postural Control   Posture/Postural Control Postural limitations   Postural Limitations Decreased lumbar lordosis  leans to the right; hips to the left     ROM / Strength   AROM / PROM / Strength AROM;Strength  AROM   AROM Assessment Site Lumbar   Lumbar Flexion full   Lumbar Extension decreased by 50%   Lumbar - Right Side Bend decreased by 25%   Lumbar - Left Side Bend decreased by 25%   Lumbar - Right Rotation full   Lumbar - Left Rotation full     Palpation   SI assessment  pelvis in correct alignment     Ambulation/Gait   Ambulation/Gait No                     [facility] Adult PT Treatment/Exercise - 10/24/16 0001      Manual Therapy   Manual Therapy Soft tissue mobilization   Manual therapy comments manually stretched bil.  hip and post. left hip   Joint Mobilization gapping of bil. sides of L1-L5 bil.    Neural Stretch to left LE in supine                PT Education - 10/24/16 1010    Education provided No          PT Short Term Goals - 08/22/16 0936      PT SHORT TERM GOAL #1   Title independent with initial HEP   Time 4   Period Weeks   Status Achieved     PT SHORT  TERM GOAL #2   Title pain is intermittent instead of constant due to increased lumbar mobility   Time 4   Period Weeks   Status Achieved           PT Long Term Goals - 10/24/16 1012      PT LONG TERM GOAL #1   Title independent with HEP and understand how to progress    Time 8   Period Weeks   Status Achieved     PT LONG TERM GOAL #2   Title pain with sitting reduced >/= 50% due to reduction in muscle tightness   Time 8   Period Weeks   Status Achieved     PT LONG TERM GOAL #3   Title pain in morning decreased >/= 50% due to increased lumbar mobility   Time 8   Period Weeks   Status Achieved     PT LONG TERM GOAL #4   Title FOTO score is </= 40% limitation   Time 8   Period Weeks   Status Achieved               Plan - 10/24/16 1012    Clinical Impression Statement Patient had decreased pain after therapy.  Patient reports his pain is more centralized.  Patient understands his HEP and how to manage his pain.  Patient has decreased lumbar extension due to the arthritis and stenosis in his lumbar spine. Patient has met his goals.  Patient is ready for discharge.    Rehab Potential Excellent   Clinical Impairments Affecting Rehab Potential None   PT Treatment/Interventions Electrical Stimulation;Cryotherapy;Ultrasound;Traction;Moist Heat;Therapeutic activities;Therapeutic exercise;Neuromuscular re-education;Patient/family education;Passive range of motion;Manual techniques;Dry needling   PT Next Visit Plan Discharge to HEP   PT Home Exercise Plan Current HEP   Consulted and Agree with Plan of Care Patient      Patient will benefit from skilled therapeutic intervention in order to improve the following deficits and impairments:  Decreased range of motion, Increased fascial restricitons, Increased muscle spasms, Decreased mobility, Hypomobility, Impaired flexibility, Decreased activity tolerance  Visit Diagnosis: Other muscle spasm  Chronic bilateral low back  pain without sciatica  G-Codes - 10/24/16 1014    Functional Assessment Tool Used FOTO score is 47% limitation   Functional Limitation Other PT primary   Other PT Primary Goal Status (S9628) At least 40 percent but less than 60 percent impaired, limited or restricted   Other PT Primary Discharge Status (Z6629) At least 40 percent but less than 60 percent impaired, limited or restricted      Problem List Patient Active Problem List   Diagnosis Date Noted  . Type 2 diabetes mellitus with circulatory disorder ([place]) 09/16/2014  . Atypical chest pain 08/19/2014  . Impaired fasting glucose 08/19/2014  . Dyslipidemia 08/19/2014  . Syncope 10/22/2011  . Chest pain with moderate risk of acute coronary syndrome 10/22/2011  . BRONCHOPNEUMONIA ORGANISM UNSPECIFIED 12/26/2007  . TOBACCO ABUSE, HX OF 12/26/2007    Earlie Counts, PT 10/24/16 11:10 AM   [facility] [address] [place], [place], [address] Phone: [phone]   Fax:  [phone]  Name: Brian Fitzpatrick MRN: [id number] Date of Birth: 10/14/1934  PHYSICAL THERAPY DISCHARGE SUMMARY  Visits from Start of Care: 8  Current functional level related to goals / functional outcomes: See above.   Remaining deficits: See above.    Education / Equipment: HEP Plan: Patient agrees to discharge.  Patient goals were met. Patient is being discharged due to meeting the stated rehab goals. Thank you for the referral. Earlie Counts, PT 10/24/16 11:10 AM   ?????

## 2017-02-05 DIAGNOSIS — H353131 Nonexudative age-related macular degeneration, bilateral, early dry stage: Secondary | ICD-10-CM | POA: Diagnosis not present

## 2017-02-05 DIAGNOSIS — H02834 Dermatochalasis of left upper eyelid: Secondary | ICD-10-CM | POA: Diagnosis not present

## 2017-02-05 DIAGNOSIS — H02831 Dermatochalasis of right upper eyelid: Secondary | ICD-10-CM | POA: Diagnosis not present

## 2017-02-05 DIAGNOSIS — H2513 Age-related nuclear cataract, bilateral: Secondary | ICD-10-CM | POA: Diagnosis not present

## 2017-02-19 DIAGNOSIS — E119 Type 2 diabetes mellitus without complications: Secondary | ICD-10-CM | POA: Diagnosis not present

## 2017-02-19 DIAGNOSIS — Z7984 Long term (current) use of oral hypoglycemic drugs: Secondary | ICD-10-CM | POA: Diagnosis not present

## 2017-02-19 DIAGNOSIS — M48061 Spinal stenosis, lumbar region without neurogenic claudication: Secondary | ICD-10-CM | POA: Diagnosis not present

## 2017-03-13 ENCOUNTER — Encounter: Payer: Self-pay | Admitting: Neurology

## 2017-03-13 ENCOUNTER — Other Ambulatory Visit: Payer: Self-pay | Admitting: *Deleted

## 2017-03-13 DIAGNOSIS — M79641 Pain in right hand: Secondary | ICD-10-CM

## 2017-04-11 DIAGNOSIS — M67911 Unspecified disorder of synovium and tendon, right shoulder: Secondary | ICD-10-CM | POA: Diagnosis not present

## 2017-04-12 DIAGNOSIS — M25511 Pain in right shoulder: Secondary | ICD-10-CM | POA: Diagnosis not present

## 2017-04-12 DIAGNOSIS — S46011D Strain of muscle(s) and tendon(s) of the rotator cuff of right shoulder, subsequent encounter: Secondary | ICD-10-CM | POA: Diagnosis not present

## 2017-04-16 ENCOUNTER — Encounter: Payer: PPO | Admitting: Neurology

## 2017-04-16 DIAGNOSIS — M25511 Pain in right shoulder: Secondary | ICD-10-CM | POA: Diagnosis not present

## 2017-04-16 DIAGNOSIS — S46011D Strain of muscle(s) and tendon(s) of the rotator cuff of right shoulder, subsequent encounter: Secondary | ICD-10-CM | POA: Diagnosis not present

## 2017-04-25 DIAGNOSIS — M25511 Pain in right shoulder: Secondary | ICD-10-CM | POA: Diagnosis not present

## 2017-04-25 DIAGNOSIS — S46011D Strain of muscle(s) and tendon(s) of the rotator cuff of right shoulder, subsequent encounter: Secondary | ICD-10-CM | POA: Diagnosis not present

## 2017-05-02 DIAGNOSIS — H903 Sensorineural hearing loss, bilateral: Secondary | ICD-10-CM | POA: Diagnosis not present

## 2017-08-27 DIAGNOSIS — M48061 Spinal stenosis, lumbar region without neurogenic claudication: Secondary | ICD-10-CM | POA: Diagnosis not present

## 2017-08-27 DIAGNOSIS — J029 Acute pharyngitis, unspecified: Secondary | ICD-10-CM | POA: Diagnosis not present

## 2017-08-27 DIAGNOSIS — Z7984 Long term (current) use of oral hypoglycemic drugs: Secondary | ICD-10-CM | POA: Diagnosis not present

## 2017-08-27 DIAGNOSIS — Z86010 Personal history of colonic polyps: Secondary | ICD-10-CM | POA: Diagnosis not present

## 2017-08-27 DIAGNOSIS — E1165 Type 2 diabetes mellitus with hyperglycemia: Secondary | ICD-10-CM | POA: Diagnosis not present

## 2017-08-27 DIAGNOSIS — Z1389 Encounter for screening for other disorder: Secondary | ICD-10-CM | POA: Diagnosis not present

## 2017-08-27 DIAGNOSIS — Z0000 Encounter for general adult medical examination without abnormal findings: Secondary | ICD-10-CM | POA: Diagnosis not present

## 2017-08-27 DIAGNOSIS — E119 Type 2 diabetes mellitus without complications: Secondary | ICD-10-CM | POA: Diagnosis not present

## 2017-09-04 DIAGNOSIS — M545 Low back pain: Secondary | ICD-10-CM | POA: Diagnosis not present

## 2017-09-04 DIAGNOSIS — Z6827 Body mass index (BMI) 27.0-27.9, adult: Secondary | ICD-10-CM | POA: Diagnosis not present

## 2017-09-04 DIAGNOSIS — R030 Elevated blood-pressure reading, without diagnosis of hypertension: Secondary | ICD-10-CM | POA: Diagnosis not present

## 2017-09-04 DIAGNOSIS — M5416 Radiculopathy, lumbar region: Secondary | ICD-10-CM | POA: Diagnosis not present

## 2017-09-24 ENCOUNTER — Telehealth (INDEPENDENT_AMBULATORY_CARE_PROVIDER_SITE_OTHER): Payer: Self-pay | Admitting: Physical Medicine and Rehabilitation

## 2017-09-26 NOTE — Telephone Encounter (Signed)
Submitted auth request on acuity website and attached notes from Dr. Vertell Limber

## 2017-09-27 NOTE — Telephone Encounter (Signed)
Received auth for codes [phone] and [phone]. [id number]. eff 09/26/17-12/25/17.

## 2017-10-07 ENCOUNTER — Encounter (INDEPENDENT_AMBULATORY_CARE_PROVIDER_SITE_OTHER): Payer: Self-pay | Admitting: Physical Medicine and Rehabilitation

## 2017-10-07 ENCOUNTER — Ambulatory Visit (INDEPENDENT_AMBULATORY_CARE_PROVIDER_SITE_OTHER): Payer: PPO | Admitting: Physical Medicine and Rehabilitation

## 2017-10-07 ENCOUNTER — Ambulatory Visit (INDEPENDENT_AMBULATORY_CARE_PROVIDER_SITE_OTHER): Payer: PPO

## 2017-10-07 VITALS — BP 152/82 | HR 59 | Temp 98.6°F

## 2017-10-07 DIAGNOSIS — M47816 Spondylosis without myelopathy or radiculopathy, lumbar region: Secondary | ICD-10-CM | POA: Diagnosis not present

## 2017-10-07 MED ORDER — METHYLPREDNISOLONE ACETATE 80 MG/ML IJ SUSP
80.0000 mg | Freq: Once | INTRAMUSCULAR | Status: AC
  Administered 2017-10-07: 80 mg

## 2017-10-07 MED ORDER — LIDOCAINE HCL (PF) 1 % IJ SOLN
2.0000 mL | Freq: Once | INTRAMUSCULAR | Status: AC
  Administered 2017-10-07: 2 mL

## 2017-10-07 NOTE — Patient Instructions (Signed)

## 2017-10-07 NOTE — Progress Notes (Deleted)
States that he has some soreness, stenosis, misalignment at L4-5 and Nerve pain.  +Driver-Son Merrilee Seashore, -BT, - Dye allergy

## 2017-10-08 ENCOUNTER — Encounter (INDEPENDENT_AMBULATORY_CARE_PROVIDER_SITE_OTHER): Payer: Self-pay | Admitting: Physical Medicine and Rehabilitation

## 2017-10-08 NOTE — Procedures (Signed)
Brian Fitzpatrick is an 83 year old gentleman with chronic worsening severe at times axial low back pain worse with standing and going from sit to stand.  He gets some referral pattern into both hamstrings.  He has no real pain past the knees.  No paresthesias.  He is followed by Dr. Vertell Limber at [facility].  Dr. Vertell Limber request bilateral facet joint blocks at L4-5.  This would be diagnostic and hopefully therapeutic.  Patient's questions were answered today and he will follow-up with Dr. Vertell Limber.  Lumbar Facet Joint Intra-Articular Injection(s) with Fluoroscopic Guidance  Patient: Brian Fitzpatrick      Date of Birth: May 09, 1934 MRN: [id number] PCP: Lavone Orn, MD      Visit Date: 10/07/2017   Universal Protocol:    Date/Time: 10/07/2017  Consent Given By: the patient  Position: PRONE   Additional Comments: Vital signs were monitored before and after the procedure. Patient was prepped and draped in the usual sterile fashion. The correct patient, procedure, and site was verified.   Injection Procedure Details:  Procedure Site One Meds Administered:  Meds ordered this encounter  Medications  . lidocaine (PF) (XYLOCAINE) 1 % injection 2 mL  . methylPREDNISolone acetate (DEPO-MEDROL) injection 80 mg     Laterality: Bilateral  Location/Site:  L4-L5  Needle size: 22 guage  Needle type: Spinal  Needle Placement: Articular  Findings:  -Contrast Used: 0.5 mL iohexol 180 mg iodine/mL   -Comments: Excellent flow of contrast producing a partial arthrogram.  Procedure Details: The fluoroscope beam is vertically oriented in AP, and the inferior recess is visualized beneath the lower pole of the inferior apophyseal process, which represents the target point for needle insertion. When direct visualization is difficult the target point is located at the medial projection of the vertebral pedicle. The region overlying each aforementioned target is locally anesthetized with  a 1 to 2 ml. volume of 1% Lidocaine without Epinephrine.   The spinal needle was inserted into each of the above mentioned facet joints using biplanar fluoroscopic guidance. A 0.25 to 0.5 ml. volume of Isovue-250 was injected and a partial facet joint arthrogram was obtained. A single spot film was obtained of the resulting arthrogram.    One to 1.25 ml of the steroid/anesthetic solution was then injected into each of the facet joints noted above.   Additional Comments:  The patient tolerated the procedure well Dressing: Band-Aid    Post-procedure details: Patient was observed during the procedure. Post-procedure instructions were reviewed.  Patient left the clinic in stable condition.

## 2017-11-13 DIAGNOSIS — M545 Low back pain: Secondary | ICD-10-CM | POA: Diagnosis not present

## 2017-11-13 DIAGNOSIS — M5416 Radiculopathy, lumbar region: Secondary | ICD-10-CM | POA: Diagnosis not present

## 2017-11-13 DIAGNOSIS — M48062 Spinal stenosis, lumbar region with neurogenic claudication: Secondary | ICD-10-CM | POA: Diagnosis not present

## 2017-11-13 DIAGNOSIS — M4316 Spondylolisthesis, lumbar region: Secondary | ICD-10-CM | POA: Diagnosis not present

## 2017-11-21 ENCOUNTER — Other Ambulatory Visit: Payer: Self-pay | Admitting: Neurosurgery

## 2017-11-21 DIAGNOSIS — M4316 Spondylolisthesis, lumbar region: Secondary | ICD-10-CM

## 2017-12-03 ENCOUNTER — Ambulatory Visit
Admission: RE | Admit: 2017-12-03 | Discharge: 2017-12-03 | Disposition: A | Discharge status: Home / Self Care | Payer: PPO | Source: Ambulatory Visit | Attending: Neurosurgery | Admitting: Neurosurgery

## 2017-12-03 DIAGNOSIS — M48061 Spinal stenosis, lumbar region without neurogenic claudication: Secondary | ICD-10-CM | POA: Diagnosis not present

## 2017-12-03 DIAGNOSIS — M4316 Spondylolisthesis, lumbar region: Secondary | ICD-10-CM

## 2017-12-25 DIAGNOSIS — M545 Low back pain: Secondary | ICD-10-CM | POA: Diagnosis not present

## 2017-12-25 DIAGNOSIS — M48062 Spinal stenosis, lumbar region with neurogenic claudication: Secondary | ICD-10-CM | POA: Diagnosis not present

## 2017-12-25 DIAGNOSIS — M4300 Spondylolysis, site unspecified: Secondary | ICD-10-CM | POA: Diagnosis not present

## 2017-12-25 DIAGNOSIS — M5416 Radiculopathy, lumbar region: Secondary | ICD-10-CM | POA: Diagnosis not present

## 2017-12-27 ENCOUNTER — Other Ambulatory Visit: Payer: Self-pay | Admitting: Neurosurgery

## 2017-12-31 DIAGNOSIS — B360 Pityriasis versicolor: Secondary | ICD-10-CM | POA: Diagnosis not present

## 2017-12-31 DIAGNOSIS — L570 Actinic keratosis: Secondary | ICD-10-CM | POA: Diagnosis not present

## 2017-12-31 DIAGNOSIS — L812 Freckles: Secondary | ICD-10-CM | POA: Diagnosis not present

## 2017-12-31 DIAGNOSIS — L821 Other seborrheic keratosis: Secondary | ICD-10-CM | POA: Diagnosis not present

## 2018-01-27 DIAGNOSIS — M4300 Spondylolysis, site unspecified: Secondary | ICD-10-CM | POA: Diagnosis not present

## 2018-01-27 DIAGNOSIS — M5416 Radiculopathy, lumbar region: Secondary | ICD-10-CM | POA: Diagnosis not present

## 2018-01-27 DIAGNOSIS — M545 Low back pain: Secondary | ICD-10-CM | POA: Diagnosis not present

## 2018-01-27 DIAGNOSIS — M4316 Spondylolisthesis, lumbar region: Secondary | ICD-10-CM | POA: Diagnosis not present

## 2018-01-28 ENCOUNTER — Inpatient Hospital Stay (HOSPITAL_COMMUNITY): Admission: RE | Admit: 2018-01-28 | Payer: PPO | Source: Ambulatory Visit

## 2018-01-31 ENCOUNTER — Inpatient Hospital Stay (HOSPITAL_COMMUNITY): Admission: RE | Admit: 2018-01-31 | Payer: PPO | Source: Ambulatory Visit | Admitting: Neurosurgery

## 2018-01-31 ENCOUNTER — Encounter (HOSPITAL_COMMUNITY): Admission: RE | Payer: Self-pay | Source: Ambulatory Visit

## 2018-01-31 SURGERY — POSTERIOR LUMBAR FUSION 2 LEVEL
Anesthesia: General | Site: Back

## 2018-02-11 DIAGNOSIS — H02831 Dermatochalasis of right upper eyelid: Secondary | ICD-10-CM | POA: Diagnosis not present

## 2018-02-11 DIAGNOSIS — H2513 Age-related nuclear cataract, bilateral: Secondary | ICD-10-CM | POA: Diagnosis not present

## 2018-02-11 DIAGNOSIS — H02834 Dermatochalasis of left upper eyelid: Secondary | ICD-10-CM | POA: Diagnosis not present

## 2018-02-11 DIAGNOSIS — H353131 Nonexudative age-related macular degeneration, bilateral, early dry stage: Secondary | ICD-10-CM | POA: Diagnosis not present

## 2018-03-06 DIAGNOSIS — R748 Abnormal levels of other serum enzymes: Secondary | ICD-10-CM | POA: Diagnosis not present

## 2018-03-06 DIAGNOSIS — M48061 Spinal stenosis, lumbar region without neurogenic claudication: Secondary | ICD-10-CM | POA: Diagnosis not present

## 2018-03-06 DIAGNOSIS — Z800 Family history of malignant neoplasm of digestive organs: Secondary | ICD-10-CM | POA: Diagnosis not present

## 2018-03-06 DIAGNOSIS — E119 Type 2 diabetes mellitus without complications: Secondary | ICD-10-CM | POA: Diagnosis not present

## 2018-03-06 DIAGNOSIS — Z7984 Long term (current) use of oral hypoglycemic drugs: Secondary | ICD-10-CM | POA: Diagnosis not present

## 2018-03-06 DIAGNOSIS — M79641 Pain in right hand: Secondary | ICD-10-CM | POA: Diagnosis not present

## 2018-03-11 DIAGNOSIS — M9904 Segmental and somatic dysfunction of sacral region: Secondary | ICD-10-CM | POA: Diagnosis not present

## 2018-03-11 DIAGNOSIS — Q72812 Congenital shortening of left lower limb: Secondary | ICD-10-CM | POA: Diagnosis not present

## 2018-03-11 DIAGNOSIS — M9903 Segmental and somatic dysfunction of lumbar region: Secondary | ICD-10-CM | POA: Diagnosis not present

## 2018-03-11 DIAGNOSIS — M5136 Other intervertebral disc degeneration, lumbar region: Secondary | ICD-10-CM | POA: Diagnosis not present

## 2018-04-01 DIAGNOSIS — R198 Other specified symptoms and signs involving the digestive system and abdomen: Secondary | ICD-10-CM | POA: Diagnosis not present

## 2018-04-01 DIAGNOSIS — Z86010 Personal history of colonic polyps: Secondary | ICD-10-CM | POA: Diagnosis not present

## 2018-04-01 DIAGNOSIS — Z800 Family history of malignant neoplasm of digestive organs: Secondary | ICD-10-CM | POA: Diagnosis not present

## 2018-04-01 DIAGNOSIS — R1314 Dysphagia, pharyngoesophageal phase: Secondary | ICD-10-CM | POA: Diagnosis not present

## 2018-04-01 DIAGNOSIS — R1084 Generalized abdominal pain: Secondary | ICD-10-CM | POA: Diagnosis not present

## 2018-04-14 DIAGNOSIS — Z800 Family history of malignant neoplasm of digestive organs: Secondary | ICD-10-CM | POA: Diagnosis not present

## 2018-04-14 DIAGNOSIS — Z86010 Personal history of colonic polyps: Secondary | ICD-10-CM | POA: Diagnosis not present

## 2018-04-14 DIAGNOSIS — D126 Benign neoplasm of colon, unspecified: Secondary | ICD-10-CM | POA: Diagnosis not present

## 2018-04-16 DIAGNOSIS — D126 Benign neoplasm of colon, unspecified: Secondary | ICD-10-CM | POA: Diagnosis not present

## 2018-04-28 ENCOUNTER — Other Ambulatory Visit: Payer: Self-pay | Admitting: Neurosurgery

## 2018-04-28 DIAGNOSIS — M4316 Spondylolisthesis, lumbar region: Secondary | ICD-10-CM | POA: Diagnosis not present

## 2018-04-28 DIAGNOSIS — R030 Elevated blood-pressure reading, without diagnosis of hypertension: Secondary | ICD-10-CM | POA: Diagnosis not present

## 2018-04-28 DIAGNOSIS — M48062 Spinal stenosis, lumbar region with neurogenic claudication: Secondary | ICD-10-CM | POA: Diagnosis not present

## 2018-04-28 DIAGNOSIS — M5416 Radiculopathy, lumbar region: Secondary | ICD-10-CM | POA: Diagnosis not present

## 2018-06-12 HISTORY — PX: BACK SURGERY: SHX140

## 2018-06-13 DIAGNOSIS — L304 Erythema intertrigo: Secondary | ICD-10-CM | POA: Diagnosis not present

## 2018-06-30 ENCOUNTER — Encounter (HOSPITAL_COMMUNITY): Payer: Self-pay

## 2018-06-30 NOTE — Pre-Procedure Instructions (Signed)
EDIN KON  06/30/2018      [facility] [name], [place] [address] [place] [address] Phone: [phone] Fax: [phone]    Your procedure is scheduled on Tuesday August 27.  Report to [facility] Admitting at 5:30 A.M.  Call this number if you have problems the morning of surgery:  [phone]   Remember:  Do not eat or drink after midnight.    Take these medicines the morning of surgery with A SIP OF WATER: NONE  DO NOT TAKE Metformin (glucophage) the day of surgery  7 days prior to surgery STOP taking any Aspirin(unless otherwise instructed by your surgeon), Aleve, Naproxen, Ibuprofen, Motrin, Advil, Goody's, BC's, all herbal medications, all supplements, fish oil, and all vitamins     How to Manage Your Diabetes Before and After Surgery  Why is it important to control my blood sugar before and after surgery? . Improving blood sugar levels before and after surgery helps healing and can limit problems. . A way of improving blood sugar control is eating a healthy diet by: o  Eating less sugar and carbohydrates o  Increasing activity/exercise o  Talking with your doctor about reaching your blood sugar goals . High blood sugars (greater than 180 mg/dL) can raise your risk of infections and slow your recovery, so you will need to focus on controlling your diabetes during the weeks before surgery. . Make sure that the doctor who takes care of your diabetes knows about your planned surgery including the date and location.  How do I manage my blood sugar before surgery? . Check your blood sugar at least 4 times a day, starting 2 days before surgery, to make sure that the level is not too high or low. o Check your blood sugar the morning of your surgery when you wake up and every 2 hours until you get to the Short Stay unit. . If your blood sugar is less than 70 mg/dL, you will need to treat for low blood sugar: o Do  not take insulin. o Treat a low blood sugar (less than 70 mg/dL) with  cup of clear juice (cranberry or apple), 4 glucose tablets, OR glucose gel. Recheck blood sugar in 15 minutes after treatment (to make sure it is greater than 70 mg/dL). If your blood sugar is not greater than 70 mg/dL on recheck, call [phone] o  for further instructions. . Report your blood sugar to the short stay nurse when you get to Short Stay.  . If you are admitted to the hospital after surgery: o Your blood sugar will be checked by the staff and you will probably be given insulin after surgery (instead of oral diabetes medicines) to make sure you have good blood sugar levels. o The goal for blood sugar control after surgery is 80-180 mg/dL.                  Do not wear jewelry  Do not wear lotions, powders, or colognes, or deodorant.  Do not shave 48 hours prior to surgery.  Men may shave face and neck.  Do not bring valuables to the hospital.  [facility] is not responsible for any belongings or valuables.  Contacts, dentures or bridgework may not be worn into surgery.  Leave your suitcase in the car.  After surgery it may be brought to your room.  For patients admitted to the hospital, discharge time will be determined  by your treatment team.  Patients discharged the day of surgery will not be allowed to drive home.   Special instructions:    [facility]- Preparing For Surgery  Before surgery, you can play an important role. Because skin is not sterile, your skin needs to be as free of germs as possible. You can reduce the number of germs on your skin by washing with CHG (chlorahexidine gluconate) Soap before surgery.  CHG is an antiseptic cleaner which kills germs and bonds with the skin to continue killing germs even after washing.    Oral Hygiene is also important to reduce your risk of infection.  Remember - BRUSH YOUR TEETH THE MORNING OF SURGERY WITH YOUR REGULAR TOOTHPASTE  Please  do not use if you have an allergy to CHG or antibacterial soaps. If your skin becomes reddened/irritated stop using the CHG.  Do not shave (including legs and underarms) for at least 48 hours prior to first CHG shower. It is OK to shave your face.  Please follow these instructions carefully.   1. Shower the NIGHT BEFORE SURGERY and the MORNING OF SURGERY with CHG.   2. If you chose to wash your hair, wash your hair first as usual with your normal shampoo.  3. After you shampoo, rinse your hair and body thoroughly to remove the shampoo.  4. Use CHG as you would any other liquid soap. You can apply CHG directly to the skin and wash gently with a scrungie or a clean washcloth.   5. Apply the CHG Soap to your body ONLY FROM THE NECK DOWN.  Do not use on open wounds or open sores. Avoid contact with your eyes, ears, mouth and genitals (private parts). Wash Face and genitals (private parts)  with your normal soap.  6. Wash thoroughly, paying special attention to the area where your surgery will be performed.  7. Thoroughly rinse your body with warm water from the neck down.  8. DO NOT shower/wash with your normal soap after using and rinsing off the CHG Soap.  9. Pat yourself dry with a CLEAN TOWEL.  10. Wear CLEAN PAJAMAS to bed the night before surgery, wear comfortable clothes the morning of surgery  11. Place CLEAN SHEETS on your bed the night of your first shower and DO NOT SLEEP WITH PETS.    Day of Surgery:  Do not apply any deodorants/lotions.  Please wear clean clothes to the hospital/surgery center.   Remember to brush your teeth WITH YOUR REGULAR TOOTHPASTE.    Please read over the following fact sheets that you were given. Coughing and Deep Breathing, MRSA Information and Surgical Site Infection Prevention

## 2018-07-01 ENCOUNTER — Other Ambulatory Visit: Payer: Self-pay

## 2018-07-01 ENCOUNTER — Encounter (HOSPITAL_COMMUNITY)
Admission: RE | Admit: 2018-07-01 | Discharge: 2018-07-01 | Disposition: A | Discharge status: Home / Self Care | Payer: PPO | Source: Ambulatory Visit | Attending: Neurosurgery | Admitting: Neurosurgery

## 2018-07-01 ENCOUNTER — Encounter (HOSPITAL_COMMUNITY): Payer: Self-pay

## 2018-07-01 DIAGNOSIS — I440 Atrioventricular block, first degree: Secondary | ICD-10-CM | POA: Diagnosis not present

## 2018-07-01 DIAGNOSIS — Z79899 Other long term (current) drug therapy: Secondary | ICD-10-CM | POA: Insufficient documentation

## 2018-07-01 DIAGNOSIS — Z01812 Encounter for preprocedural laboratory examination: Secondary | ICD-10-CM | POA: Insufficient documentation

## 2018-07-01 DIAGNOSIS — N400 Enlarged prostate without lower urinary tract symptoms: Secondary | ICD-10-CM | POA: Diagnosis not present

## 2018-07-01 DIAGNOSIS — E119 Type 2 diabetes mellitus without complications: Secondary | ICD-10-CM | POA: Diagnosis not present

## 2018-07-01 DIAGNOSIS — I499 Cardiac arrhythmia, unspecified: Secondary | ICD-10-CM | POA: Diagnosis not present

## 2018-07-01 DIAGNOSIS — Z01810 Encounter for preprocedural cardiovascular examination: Secondary | ICD-10-CM | POA: Diagnosis not present

## 2018-07-01 DIAGNOSIS — M48062 Spinal stenosis, lumbar region with neurogenic claudication: Secondary | ICD-10-CM | POA: Insufficient documentation

## 2018-07-01 LAB — BASIC METABOLIC PANEL
Anion gap: 11 (ref 5–15)
BUN: 19 mg/dL (ref 8–23)
CO2: 26 mmol/L (ref 22–32)
Calcium: 10.2 mg/dL (ref 8.9–10.3)
Chloride: 102 mmol/L (ref 98–111)
Creatinine, Ser: 1.24 mg/dL (ref 0.61–1.24)
GFR calc Af Amer: >60 mL/min (ref >60)
GFR calc non Af Amer: 52 mL/min — ABNORMAL LOW (ref >60)
Glucose, Bld: 113 mg/dL — ABNORMAL HIGH (ref 70–99)
Potassium: 4 mmol/L (ref 3.5–5.1)
Sodium: 139 mmol/L (ref 135–145)

## 2018-07-01 LAB — SURGICAL PCR SCREEN
MRSA, PCR: NEGATIVE
Staphylococcus aureus: NEGATIVE

## 2018-07-01 LAB — TYPE AND SCREEN
ABO/RH(D): A POS
Antibody Screen: NEGATIVE

## 2018-07-01 LAB — HEMOGLOBIN A1C
Hgb A1c MFr Bld: 6.4 % — ABNORMAL HIGH (ref 4.8–5.6)
Mean Plasma Glucose: 136.98 mg/dL

## 2018-07-01 LAB — CBC
HCT: 45.6 % (ref 39.0–52.0)
Hemoglobin: 15.3 g/dL (ref 13.0–17.0)
MCH: 31.2 pg (ref 26.0–34.0)
MCHC: 33.6 g/dL (ref 30.0–36.0)
MCV: 92.9 fL (ref 78.0–100.0)
Platelets: 250 10*3/uL (ref 150–400)
RBC: 4.91 MIL/uL (ref 4.22–5.81)
RDW: 12.6 % (ref 11.5–15.5)
WBC: 6.9 10*3/uL (ref 4.0–10.5)

## 2018-07-01 LAB — ABO/RH: ABO/RH(D): A POS

## 2018-07-01 LAB — GLUCOSE, CAPILLARY: Glucose-Capillary: 177 mg/dL — ABNORMAL HIGH (ref 70–99)

## 2018-07-01 NOTE — Progress Notes (Signed)
Healthy 84 yr old male, mostly takes vitamins PCP is Dr. Electa Sniff  LOV 12/2017.  He also manages his diabetes meds.  Patient checks his sugar twice a week.  Ranges from 100 -148.  Todays bld sugar 177 - patient states d/t his eating lunch and excercising Had vertigo back in 2015, echo 04/2014, stress 09/2014.  He states no cardiac issues at all currently. Last A1C was 6 in 2015

## 2018-07-01 NOTE — Pre-Procedure Instructions (Signed)
Brian Fitzpatrick  07/01/2018      [facility] [address] [place], [place] [address] [place] [address] Phone: [phone] Fax: [phone]    Your procedure is scheduled on Tuesday August 27.   Report to [facility] Admitting at 5:30 A.M.             (posted surgery time 7:30a - 12:21p)   Call this number if you have problems the morning of surgery:  [phone]   Remember:   Do not eat any foods or drink any liquids after midnight, Monday.    Take these medicines the morning of surgery with A SIP OF WATER: NONE  DO NOT TAKE Metformin (glucophage) the day of surgery  7 days prior to surgery STOP taking any Aspirin(unless otherwise instructed by your surgeon), Aleve, Naproxen, Ibuprofen, Motrin, Advil, Goody's, BC's, all herbal medications, all supplements, fish oil, and all vitamins.   Do not wear jewelry - no rings or watches.  Do not wear lotions, colognes, or deodorant.             Men may shave face and neck.  Do not bring valuables to the hospital.  [facility] is not responsible for any belongings or valuables.  Contacts, dentures or bridgework may not be worn into surgery.  Leave your suitcase in the car.  After surgery it may be brought to your room.  For patients admitted to the hospital, discharge time will be determined by your treatment team.     Special instructions:    [facility]- Preparing For Surgery  Before surgery, you can play an important role. Because skin is not sterile, your skin needs to be as free of germs as possible. You can reduce the number of germs on your skin by washing with CHG (chlorahexidine gluconate) Soap before surgery.  CHG is an antiseptic cleaner which kills germs and bonds with the skin to continue killing germs even after washing.    Oral Hygiene is also important to reduce your risk of infection.    Remember - BRUSH YOUR TEETH THE MORNING OF SURGERY WITH YOUR REGULAR  TOOTHPASTE  Please do not use if you have an allergy to CHG or antibacterial soaps. If your skin becomes reddened/irritated stop using the CHG.  Do not shave (including legs and underarms) for at least 48 hours prior to first CHG shower. It is OK to shave your face.  Please follow these instructions carefully.   1. Shower the NIGHT BEFORE SURGERY and the MORNING OF SURGERY with CHG.   2. If you chose to wash your hair, wash your hair first as usual with your normal shampoo.  3. After you shampoo, rinse your hair and body thoroughly to remove the shampoo.  4. Use CHG as you would any other liquid soap. You can apply CHG directly to the skin and wash gently with a scrungie or a clean washcloth.   5. Apply the CHG Soap to your body ONLY FROM THE NECK DOWN.  Do not use on open wounds or open sores. Avoid contact with your eyes, ears, mouth and genitals (private parts). Wash Face and genitals (private parts)  with your normal soap.  6. Wash thoroughly, paying special attention to the area where your surgery will be performed.  7. Thoroughly rinse your body with warm water from the neck down.  8. DO NOT shower/wash with your normal soap after using and rinsing off the CHG  Soap.  9. Pat yourself dry with a CLEAN TOWEL.  10. Wear CLEAN PAJAMAS to bed the night before surgery, wear comfortable clothes the morning of surgery  11. Place CLEAN SHEETS on your bed the night of your first shower and DO NOT SLEEP WITH PETS.  Day of Surgery:  Do not apply any deodorants/lotions.  Please wear clean clothes to the hospital/surgery center.   Remember to brush your teeth WITH YOUR REGULAR TOOTHPASTE.  Please read over the following fact sheets that you were given. MRSA Information and Surgical Site Infection Prevention       How to Manage Your Diabetes Before and After Surgery  Why is it important to control my blood sugar before and after surgery? . Improving blood sugar levels before and  after surgery helps healing and can limit problems. . A way of improving blood sugar control is eating a healthy diet by: o  Eating less sugar and carbohydrates o  Increasing activity/exercise o  Talking with your doctor about reaching your blood sugar goals . High blood sugars (greater than 180 mg/dL) can raise your risk of infections and slow your recovery, so you will need to focus on controlling your diabetes during the weeks before surgery. . Make sure that the doctor who takes care of your diabetes knows about your planned surgery including the date and location.  How do I manage my blood sugar before surgery? . Check your blood sugar at least 4 times a day, starting 2 days before surgery, to make sure that the level is not too high or low. o Check your blood sugar the morning of your surgery when you wake up and every 2 hours until you get to the Short Stay unit. o  . If your blood sugar is less than 70 mg/dL, you will need to treat for low blood sugar: o Do not take insulin. o Treat a low blood sugar (less than 70 mg/dL) with  cup of clear juice (cranberry or apple), 4 glucose tablets, OR glucose gel. o NO ORANGE JUICE. o  Recheck blood sugar in 15 minutes after treatment (to make sure it is greater than 70 mg/dL). If your blood sugar is not greater than 70 mg/dL on recheck, call [phone] o  for further instructions. . Report your blood sugar to the short stay nurse when you get to Short Stay.  . If you are admitted to the hospital after surgery: o Your blood sugar will be checked by the staff and you will probably be given insulin after surgery (instead of oral diabetes medicines) to make sure you have good blood sugar levels. o The goal for blood sugar control after surgery is 80-180 mg/dL.

## 2018-07-02 NOTE — Progress Notes (Signed)
Anesthesia Chart Review:  Case:  [id number] Date/Time:  07/08/18 0715   Procedure:  Decompressive laminectomy Lumbar 2-3; Lumbar 4-5 Lumbar 5 Sacral 1 decompression and fusion (N/A Back) - Decompressive laminectomy Lumbar 2-3; Lumbar 4-5 Lumbar 5 Sacral 1 decompression and fusion   Anesthesia type:  General   Pre-op diagnosis:  Spinal stenosis of lumbar region with neurogenic claudication   Location:  [facility] OR ROOM 21 / [place] [facility]   Surgeon:  Erline Levine, MD      DISCUSSION: 84 yo male former smoker for above procedure. Pertinent hx includes Vertigo, DMII.  In 2015 pt had cardiac workup for atypical chest pain. Notes from Dr. Rockne Menghini at that time indicated "he is quite active, it is unlikely that his pain is from significant coronary disease". He had a low risk stress test and an Echo showing EF 55-65% with normal wall motion.  He has no current cardiopulmonary complaints.  Anticipate he can proceed with surgery as planned barring acute status change.  VS: BP (!) 158/71   Pulse 78   Temp 36.4 C (Oral)   Resp 16   Ht 5\' 8"  (1.727 m)   Wt 83.1 kg   SpO2 97%   BMI 27.85 kg/m   PROVIDERS: Lavone Orn, MD is PCP   LABS: Labs reviewed: Acceptable for surgery. (all labs ordered are listed, but only abnormal results are displayed)  Labs Reviewed  GLUCOSE, CAPILLARY - Abnormal; Notable for the following components:      Result Value   Glucose-Capillary 177 (*)    All other components within normal limits  BASIC METABOLIC PANEL - Abnormal; Notable for the following components:   Glucose, Bld 113 (*)    GFR calc non Af Amer 52 (*)    All other components within normal limits  HEMOGLOBIN A1C - Abnormal; Notable for the following components:   Hgb A1c MFr Bld 6.4 (*)    All other components within normal limits  SURGICAL PCR SCREEN  CBC  TYPE AND SCREEN  ABO/RH    IMAGES: FINDINGS: Hyperinflation is present consistent with emphysema. No airspace disease. No effusion. The  cardiopericardial silhouette is within normal limits. RIGHT AC joint osteoarthritis. Thoracic spine DISH.  IMPRESSION: Hyperinflation consistent with emphysema. No acute cardiopulmonary disease.   EKG: 07/01/2018: Sinus rhythm with sinus arrhythmia with 1st degree A-V block with occasional Premature ventricular complexes. Left axis deviation. Inferior infarct , age undetermined. No significant change since last tracing   CV: Myocardial Perfusion Imaging 09/28/2014: Rest ECG: NSR - Normal EKG  Stress ECG: No significant change from baseline ECG  QPS Raw Data Images:  Normal; no motion artifact; normal heart/lung ratio. Stress Images:  Normal homogeneous uptake in all areas of the myocardium. Rest Images:  Normal homogeneous uptake in all areas of the myocardium. Subtraction (SDS):  No evidence of ischemia.  Impression Exercise Capacity:  Excellent exercise capacity. BP Response:  Normal blood pressure response. Clinical Symptoms:  No significant symptoms noted. ECG Impression:  No significant ST segment change suggestive of ischemia. Comparison with Prior Nuclear Study: No previous nuclear study performed  Overall Impression:  Normal stress nuclear study.  LV Wall Motion:  NL LV Function; NL Wall Motion  Echo 08/19/2014: Study Conclusions  - Left ventricle: The cavity size was normal. Wall thickness was increased in a pattern of mild LVH. Systolic function was normal. The estimated ejection fraction was in the range of 55% to 65%. Wall motion was normal; there were no regional wall motion abnormalities.  Doppler parameters are consistent with abnormal left ventricular relaxation (grade 1 diastolic dysfunction). - Mitral valve: There was mild regurgitation. - Right atrium: The atrium was mildly dilated.  Past Medical History:  Diagnosis Date  . Arthritis   . BPH (benign prostatic hyperplasia)   . Diabetes mellitus    Borderline    DX 2009  . Vertigo     left ear sets it off/ has "crystals" in it    Past Surgical History:  Procedure Laterality Date  . HERNIA REPAIR    . KNEE SURGERY    . SHOULDER SURGERY      MEDICATIONS: . b complex vitamins tablet  . Cholecalciferol (VITAMIN D) 2000 units CAPS  . Chromium 1 MG CAPS  . Coenzyme Q10 (CO Q 10) 100 MG CAPS  . Cyanocobalamin (B-12) 2500 MCG TABS  . DHEA 50 MG TABS  . GLUCOSAMINE-MSM-HYALURONIC ACD PO  . hydrocortisone 2.5 % cream  . Krill Oil 500 MG CAPS  . metFORMIN (GLUCOPHAGE) 500 MG tablet  . milk thistle 175 MG tablet  . Multiple Vitamins-Minerals (OCUVITE-LUTEIN PO)  . Omega-3 1000 MG CAPS  . OVER THE COUNTER MEDICATION  . OVER THE COUNTER MEDICATION  . Resveratrol 250 MG CAPS  . Selenium 100 MCG CAPS  . Specialty Vitamins Products (BRAIN) TABS  . Taurine 1000 MG CAPS  . Turmeric Curcumin 500 MG CAPS  . vitamin C (ASCORBIC ACID) 500 MG tablet   No current facility-administered medications for this encounter.      Wynonia Musty [facility] Short Stay Center/Anesthesiology Phone [phone] 07/02/2018 11:45 AM

## 2018-07-08 ENCOUNTER — Inpatient Hospital Stay (HOSPITAL_COMMUNITY)
Admission: RE | Admit: 2018-07-08 | Discharge: 2018-07-10 | DRG: 455 | Disposition: A | Discharge status: Home / Self Care | Payer: PPO | Source: Ambulatory Visit | Attending: Neurosurgery | Admitting: Neurosurgery

## 2018-07-08 ENCOUNTER — Inpatient Hospital Stay (HOSPITAL_COMMUNITY)
Admission: RE | Disposition: A | Discharge status: Home / Self Care | Payer: Self-pay | Source: Ambulatory Visit | Attending: Neurosurgery

## 2018-07-08 ENCOUNTER — Inpatient Hospital Stay (HOSPITAL_COMMUNITY): Payer: PPO | Admitting: Certified Registered Nurse Anesthetist

## 2018-07-08 ENCOUNTER — Inpatient Hospital Stay (HOSPITAL_COMMUNITY): Payer: PPO | Admitting: Vascular Surgery

## 2018-07-08 ENCOUNTER — Inpatient Hospital Stay (HOSPITAL_COMMUNITY): Payer: PPO

## 2018-07-08 ENCOUNTER — Other Ambulatory Visit: Payer: Self-pay

## 2018-07-08 ENCOUNTER — Encounter (HOSPITAL_COMMUNITY): Payer: Self-pay | Admitting: Certified Registered Nurse Anesthetist

## 2018-07-08 DIAGNOSIS — I739 Peripheral vascular disease, unspecified: Secondary | ICD-10-CM | POA: Diagnosis present

## 2018-07-08 DIAGNOSIS — Z419 Encounter for procedure for purposes other than remedying health state, unspecified: Secondary | ICD-10-CM

## 2018-07-08 DIAGNOSIS — E119 Type 2 diabetes mellitus without complications: Secondary | ICD-10-CM | POA: Diagnosis not present

## 2018-07-08 DIAGNOSIS — Z87891 Personal history of nicotine dependence: Secondary | ICD-10-CM

## 2018-07-08 DIAGNOSIS — Z79899 Other long term (current) drug therapy: Secondary | ICD-10-CM

## 2018-07-08 DIAGNOSIS — M4316 Spondylolisthesis, lumbar region: Secondary | ICD-10-CM | POA: Diagnosis not present

## 2018-07-08 DIAGNOSIS — M5117 Intervertebral disc disorders with radiculopathy, lumbosacral region: Secondary | ICD-10-CM | POA: Diagnosis not present

## 2018-07-08 DIAGNOSIS — Z7984 Long term (current) use of oral hypoglycemic drugs: Secondary | ICD-10-CM | POA: Diagnosis not present

## 2018-07-08 DIAGNOSIS — M48062 Spinal stenosis, lumbar region with neurogenic claudication: Secondary | ICD-10-CM | POA: Diagnosis not present

## 2018-07-08 DIAGNOSIS — M5116 Intervertebral disc disorders with radiculopathy, lumbar region: Principal | ICD-10-CM | POA: Diagnosis present

## 2018-07-08 DIAGNOSIS — M4327 Fusion of spine, lumbosacral region: Secondary | ICD-10-CM | POA: Diagnosis not present

## 2018-07-08 DIAGNOSIS — Z981 Arthrodesis status: Secondary | ICD-10-CM | POA: Diagnosis not present

## 2018-07-08 DIAGNOSIS — E785 Hyperlipidemia, unspecified: Secondary | ICD-10-CM | POA: Diagnosis not present

## 2018-07-08 LAB — GLUCOSE, CAPILLARY
Glucose-Capillary: 128 mg/dL — ABNORMAL HIGH (ref 70–99)
Glucose-Capillary: 146 mg/dL — ABNORMAL HIGH (ref 70–99)

## 2018-07-08 SURGERY — POSTERIOR LUMBAR FUSION 3 LEVEL
Anesthesia: General | Site: Back

## 2018-07-08 MED ORDER — CO Q 10 100 MG PO CAPS: 100.0000 mg | ORAL_CAPSULE | Freq: Two times a day (BID) | ORAL | Status: DC

## 2018-07-08 MED ORDER — ACETAMINOPHEN 325 MG PO TABS: 650.0000 mg | ORAL_TABLET | ORAL | Status: DC | PRN

## 2018-07-08 MED ORDER — ZOLPIDEM TARTRATE 5 MG PO TABS: 5.0000 mg | ORAL_TABLET | Freq: Every evening | ORAL | Status: DC | PRN

## 2018-07-08 MED ORDER — METHOCARBAMOL 500 MG PO TABS
ORAL_TABLET | ORAL | Status: AC
  Filled 2018-07-08: qty 1

## 2018-07-08 MED ORDER — MORPHINE SULFATE (PF) 2 MG/ML IV SOLN: 2.0000 mg | INTRAVENOUS | Status: DC | PRN

## 2018-07-08 MED ORDER — B COMPLEX PO TABS: 1.0000 | ORAL_TABLET | Freq: Every day | ORAL | Status: DC

## 2018-07-08 MED ORDER — FLEET ENEMA 7-19 GM/118ML RE ENEM: 1.0000 | ENEMA | Freq: Once | RECTAL | Status: DC | PRN

## 2018-07-08 MED ORDER — ROCURONIUM BROMIDE 10 MG/ML (PF) SYRINGE
PREFILLED_SYRINGE | INTRAVENOUS | Status: DC | PRN
  Administered 2018-07-08: 10 mg via INTRAVENOUS
  Administered 2018-07-08 (×2): 20 mg via INTRAVENOUS
  Administered 2018-07-08: 30 mg via INTRAVENOUS
  Administered 2018-07-08: 50 mg via INTRAVENOUS

## 2018-07-08 MED ORDER — LIDOCAINE 2% (20 MG/ML) 5 ML SYRINGE
INTRAMUSCULAR | Status: DC | PRN
  Administered 2018-07-08: 100 mg via INTRAVENOUS

## 2018-07-08 MED ORDER — LIDOCAINE-EPINEPHRINE 1 %-1:100000 IJ SOLN
INTRAMUSCULAR | Status: DC | PRN
  Administered 2018-07-08: 7.5 mL

## 2018-07-08 MED ORDER — METFORMIN HCL 500 MG PO TABS
500.0000 mg | ORAL_TABLET | Freq: Every day | ORAL | Status: DC
  Administered 2018-07-09 – 2018-07-10 (×2): 500 mg via ORAL
  Filled 2018-07-08 (×2): qty 1

## 2018-07-08 MED ORDER — DIPHENHYDRAMINE HCL 50 MG/ML IJ SOLN
INTRAMUSCULAR | Status: DC | PRN
  Administered 2018-07-08: 12.5 mg via INTRAVENOUS

## 2018-07-08 MED ORDER — BUPIVACAINE HCL (PF) 0.5 % IJ SOLN
INTRAMUSCULAR | Status: AC
  Filled 2018-07-08: qty 30

## 2018-07-08 MED ORDER — HYDROCODONE-ACETAMINOPHEN 5-325 MG PO TABS
ORAL_TABLET | ORAL | Status: AC
  Filled 2018-07-08: qty 2

## 2018-07-08 MED ORDER — METHOCARBAMOL 500 MG PO TABS
500.0000 mg | ORAL_TABLET | Freq: Four times a day (QID) | ORAL | Status: DC | PRN
  Administered 2018-07-08 – 2018-07-10 (×5): 500 mg via ORAL
  Filled 2018-07-08 (×4): qty 1

## 2018-07-08 MED ORDER — MILK THISTLE 175 MG PO TABS: 175.0000 mg | ORAL_TABLET | Freq: Two times a day (BID) | ORAL | Status: DC

## 2018-07-08 MED ORDER — TURMERIC CURCUMIN 500 MG PO CAPS: 500.0000 mg | ORAL_CAPSULE | ORAL | Status: DC

## 2018-07-08 MED ORDER — ROCURONIUM BROMIDE 50 MG/5ML IV SOSY
PREFILLED_SYRINGE | INTRAVENOUS | Status: AC
  Filled 2018-07-08: qty 5

## 2018-07-08 MED ORDER — ONDANSETRON HCL 4 MG/2ML IJ SOLN
4.0000 mg | Freq: Four times a day (QID) | INTRAMUSCULAR | Status: DC | PRN
  Administered 2018-07-09: 4 mg via INTRAVENOUS
  Filled 2018-07-08: qty 2

## 2018-07-08 MED ORDER — SODIUM CHLORIDE 0.9 % IV SOLN
INTRAVENOUS | Status: DC | PRN
  Administered 2018-07-08: 20 ug/min via INTRAVENOUS

## 2018-07-08 MED ORDER — BUPIVACAINE HCL (PF) 0.5 % IJ SOLN
INTRAMUSCULAR | Status: DC | PRN
  Administered 2018-07-08: 7.5 mL

## 2018-07-08 MED ORDER — PHENYLEPHRINE 40 MCG/ML (10ML) SYRINGE FOR IV PUSH (FOR BLOOD PRESSURE SUPPORT)
PREFILLED_SYRINGE | INTRAVENOUS | Status: AC
  Filled 2018-07-08: qty 10

## 2018-07-08 MED ORDER — CHLORHEXIDINE GLUCONATE CLOTH 2 % EX PADS: 6.0000 | MEDICATED_PAD | Freq: Once | CUTANEOUS | Status: DC

## 2018-07-08 MED ORDER — FENTANYL CITRATE (PF) 250 MCG/5ML IJ SOLN
INTRAMUSCULAR | Status: DC | PRN
  Administered 2018-07-08: 150 ug via INTRAVENOUS
  Administered 2018-07-08 (×4): 50 ug via INTRAVENOUS

## 2018-07-08 MED ORDER — DHEA 50 MG PO TABS: 50.0000 mg | ORAL_TABLET | Freq: Every day | ORAL | Status: DC

## 2018-07-08 MED ORDER — CEFAZOLIN SODIUM-DEXTROSE 2-4 GM/100ML-% IV SOLN
2.0000 g | INTRAVENOUS | Status: AC
  Administered 2018-07-08: 2 g via INTRAVENOUS

## 2018-07-08 MED ORDER — PROMETHAZINE HCL 25 MG/ML IJ SOLN: 6.2500 mg | INTRAMUSCULAR | Status: DC | PRN

## 2018-07-08 MED ORDER — RESVERATROL 250 MG PO CAPS: 250.0000 mg | ORAL_CAPSULE | Freq: Every day | ORAL | Status: DC

## 2018-07-08 MED ORDER — PHENYLEPHRINE 40 MCG/ML (10ML) SYRINGE FOR IV PUSH (FOR BLOOD PRESSURE SUPPORT)
PREFILLED_SYRINGE | INTRAVENOUS | Status: DC | PRN
  Administered 2018-07-08 (×2): 80 ug via INTRAVENOUS

## 2018-07-08 MED ORDER — METHOCARBAMOL 1000 MG/10ML IJ SOLN: 500.0000 mg | Freq: Four times a day (QID) | INTRAVENOUS | Status: DC | PRN

## 2018-07-08 MED ORDER — LIDOCAINE-EPINEPHRINE 1 %-1:100000 IJ SOLN
INTRAMUSCULAR | Status: AC
  Filled 2018-07-08: qty 1

## 2018-07-08 MED ORDER — PROPOFOL 10 MG/ML IV BOLUS
INTRAVENOUS | Status: AC
  Filled 2018-07-08: qty 20

## 2018-07-08 MED ORDER — THROMBIN (RECOMBINANT) 20000 UNITS EX SOLR
CUTANEOUS | Status: AC
  Filled 2018-07-08: qty 20000

## 2018-07-08 MED ORDER — ONDANSETRON HCL 4 MG PO TABS: 4.0000 mg | ORAL_TABLET | Freq: Four times a day (QID) | ORAL | Status: DC | PRN

## 2018-07-08 MED ORDER — LIDOCAINE 2% (20 MG/ML) 5 ML SYRINGE
INTRAMUSCULAR | Status: AC
  Filled 2018-07-08: qty 5

## 2018-07-08 MED ORDER — LACTATED RINGERS IV SOLN
INTRAVENOUS | Status: DC
  Administered 2018-07-08: 08:00:00 via INTRAVENOUS

## 2018-07-08 MED ORDER — KCL IN DEXTROSE-NACL 20-5-0.45 MEQ/L-%-% IV SOLN
INTRAVENOUS | Status: DC
  Administered 2018-07-08 (×2): via INTRAVENOUS
  Filled 2018-07-08: qty 1000

## 2018-07-08 MED ORDER — CEFAZOLIN SODIUM-DEXTROSE 2-4 GM/100ML-% IV SOLN
INTRAVENOUS | Status: AC
  Filled 2018-07-08: qty 100

## 2018-07-08 MED ORDER — ALUM & MAG HYDROXIDE-SIMETH 200-200-20 MG/5ML PO SUSP: 30.0000 mL | Freq: Four times a day (QID) | ORAL | Status: DC | PRN

## 2018-07-08 MED ORDER — FENTANYL CITRATE (PF) 250 MCG/5ML IJ SOLN
INTRAMUSCULAR | Status: AC
  Filled 2018-07-08: qty 5

## 2018-07-08 MED ORDER — BISACODYL 10 MG RE SUPP: 10.0000 mg | Freq: Every day | RECTAL | Status: DC | PRN

## 2018-07-08 MED ORDER — VITAMIN D 50 MCG (2000 UT) PO CAPS: 2000.0000 [IU] | ORAL_CAPSULE | ORAL | Status: DC

## 2018-07-08 MED ORDER — PROSIGHT PO TABS
1.0000 | ORAL_TABLET | Freq: Every day | ORAL | Status: DC
  Administered 2018-07-09 – 2018-07-10 (×2): 1 via ORAL
  Filled 2018-07-08 (×2): qty 1

## 2018-07-08 MED ORDER — OXYCODONE HCL 5 MG PO TABS
ORAL_TABLET | ORAL | Status: AC
  Filled 2018-07-08: qty 1

## 2018-07-08 MED ORDER — FAMOTIDINE IN NACL 20-0.9 MG/50ML-% IV SOLN
20.0000 mg | Freq: Two times a day (BID) | INTRAVENOUS | Status: DC
  Administered 2018-07-09 – 2018-07-10 (×2): 20 mg via INTRAVENOUS
  Filled 2018-07-08 (×2): qty 50

## 2018-07-08 MED ORDER — CEFAZOLIN SODIUM-DEXTROSE 2-4 GM/100ML-% IV SOLN
2.0000 g | Freq: Two times a day (BID) | INTRAVENOUS | Status: AC
  Administered 2018-07-08 – 2018-07-09 (×2): 2 g via INTRAVENOUS
  Filled 2018-07-08 (×3): qty 100

## 2018-07-08 MED ORDER — OXYCODONE HCL 5 MG PO TABS
5.0000 mg | ORAL_TABLET | ORAL | Status: DC | PRN
  Administered 2018-07-08: 5 mg via ORAL
  Filled 2018-07-08: qty 1

## 2018-07-08 MED ORDER — THROMBIN 20000 UNITS EX SOLR
CUTANEOUS | Status: DC | PRN
  Administered 2018-07-08: 09:00:00 via TOPICAL

## 2018-07-08 MED ORDER — PHENOL 1.4 % MT LIQD: 1.0000 | OROMUCOSAL | Status: DC | PRN

## 2018-07-08 MED ORDER — VITAMIN D 1000 UNITS PO TABS: 2000.0000 [IU] | ORAL_TABLET | Freq: Every day | ORAL | Status: DC

## 2018-07-08 MED ORDER — VITAMIN C 500 MG PO TABS
1000.0000 mg | ORAL_TABLET | Freq: Two times a day (BID) | ORAL | Status: DC
  Administered 2018-07-09 – 2018-07-10 (×2): 1000 mg via ORAL
  Filled 2018-07-08 (×2): qty 2

## 2018-07-08 MED ORDER — FENTANYL CITRATE (PF) 100 MCG/2ML IJ SOLN
INTRAMUSCULAR | Status: AC
  Filled 2018-07-08: qty 2

## 2018-07-08 MED ORDER — POLYETHYLENE GLYCOL 3350 17 G PO PACK: 17.0000 g | PACK | Freq: Every day | ORAL | Status: DC | PRN

## 2018-07-08 MED ORDER — VITAMIN D 1000 UNITS PO TABS
4000.0000 [IU] | ORAL_TABLET | Freq: Every day | ORAL | Status: DC
  Administered 2018-07-09 – 2018-07-10 (×2): 4000 [IU] via ORAL
  Filled 2018-07-08 (×2): qty 4

## 2018-07-08 MED ORDER — DEXAMETHASONE SODIUM PHOSPHATE 10 MG/ML IJ SOLN
INTRAMUSCULAR | Status: DC | PRN
  Administered 2018-07-08: 10 mg via INTRAVENOUS

## 2018-07-08 MED ORDER — THROMBIN 5000 UNITS EX SOLR
CUTANEOUS | Status: AC
  Filled 2018-07-08: qty 5000

## 2018-07-08 MED ORDER — HYDROCORTISONE 1 % EX CREA
1.0000 "application " | TOPICAL_CREAM | Freq: Two times a day (BID) | CUTANEOUS | Status: DC
  Administered 2018-07-09 – 2018-07-10 (×2): 1 via TOPICAL
  Filled 2018-07-08: qty 28

## 2018-07-08 MED ORDER — ONDANSETRON HCL 4 MG/2ML IJ SOLN
INTRAMUSCULAR | Status: DC | PRN
  Administered 2018-07-08: 4 mg via INTRAVENOUS

## 2018-07-08 MED ORDER — DEXAMETHASONE SODIUM PHOSPHATE 10 MG/ML IJ SOLN
INTRAMUSCULAR | Status: AC
  Filled 2018-07-08: qty 1

## 2018-07-08 MED ORDER — ONDANSETRON HCL 4 MG/2ML IJ SOLN
INTRAMUSCULAR | Status: AC
  Filled 2018-07-08: qty 2

## 2018-07-08 MED ORDER — SUGAMMADEX SODIUM 200 MG/2ML IV SOLN
INTRAVENOUS | Status: DC | PRN
  Administered 2018-07-08: 200 mg via INTRAVENOUS

## 2018-07-08 MED ORDER — CHROMIUM 1 MG PO CAPS: 1.0000 | ORAL_CAPSULE | Freq: Every day | ORAL | Status: DC

## 2018-07-08 MED ORDER — FENTANYL CITRATE (PF) 100 MCG/2ML IJ SOLN
25.0000 ug | INTRAMUSCULAR | Status: DC | PRN
  Administered 2018-07-08 (×4): 25 ug via INTRAVENOUS
  Administered 2018-07-08: 50 ug via INTRAVENOUS

## 2018-07-08 MED ORDER — BUPIVACAINE LIPOSOME 1.3 % IJ SUSP
INTRAMUSCULAR | Status: DC | PRN
  Administered 2018-07-08: 20 mL

## 2018-07-08 MED ORDER — VITAMIN B-12 1000 MCG PO TABS
2500.0000 ug | ORAL_TABLET | Freq: Two times a day (BID) | ORAL | Status: DC
  Administered 2018-07-09 – 2018-07-10 (×2): 2500 ug via ORAL
  Filled 2018-07-08 (×2): qty 3

## 2018-07-08 MED ORDER — SELENIUM 200 MCG PO TABS
100.0000 ug | ORAL_TABLET | Freq: Every day | ORAL | Status: DC
  Administered 2018-07-09 – 2018-07-10 (×2): 100 ug via ORAL
  Filled 2018-07-08 (×2): qty 1

## 2018-07-08 MED ORDER — ARTIFICIAL TEARS OPHTHALMIC OINT
TOPICAL_OINTMENT | OPHTHALMIC | Status: DC | PRN
  Administered 2018-07-08: 1 via OPHTHALMIC

## 2018-07-08 MED ORDER — OMEGA-3 1000 MG PO CAPS: 1000.0000 mg | ORAL_CAPSULE | Freq: Two times a day (BID) | ORAL | Status: DC

## 2018-07-08 MED ORDER — KRILL OIL 500 MG PO CAPS: 500.0000 mg | ORAL_CAPSULE | Freq: Every day | ORAL | Status: DC

## 2018-07-08 MED ORDER — SODIUM CHLORIDE 0.9% FLUSH: 3.0000 mL | INTRAVENOUS | Status: DC | PRN

## 2018-07-08 MED ORDER — SODIUM CHLORIDE 0.9 % IV SOLN: 250.0000 mL | INTRAVENOUS | Status: DC

## 2018-07-08 MED ORDER — DOCUSATE SODIUM 100 MG PO CAPS
100.0000 mg | ORAL_CAPSULE | Freq: Two times a day (BID) | ORAL | Status: DC
  Administered 2018-07-09 – 2018-07-10 (×2): 100 mg via ORAL
  Filled 2018-07-08 (×2): qty 1

## 2018-07-08 MED ORDER — THROMBIN 5000 UNITS EX SOLR
OROMUCOSAL | Status: DC | PRN
  Administered 2018-07-08: 09:00:00 via TOPICAL

## 2018-07-08 MED ORDER — B COMPLEX-C PO TABS
1.0000 | ORAL_TABLET | Freq: Every day | ORAL | Status: DC
  Administered 2018-07-09: 1 via ORAL
  Filled 2018-07-08 (×2): qty 1

## 2018-07-08 MED ORDER — PROPOFOL 10 MG/ML IV BOLUS
INTRAVENOUS | Status: DC | PRN
  Administered 2018-07-08: 30 mg via INTRAVENOUS
  Administered 2018-07-08: 120 mg via INTRAVENOUS

## 2018-07-08 MED ORDER — LACTATED RINGERS IV SOLN
INTRAVENOUS | Status: DC | PRN
  Administered 2018-07-08 (×3): via INTRAVENOUS

## 2018-07-08 MED ORDER — OMEGA-3-ACID ETHYL ESTERS 1 G PO CAPS
1.0000 g | ORAL_CAPSULE | Freq: Two times a day (BID) | ORAL | Status: DC
  Administered 2018-07-09 – 2018-07-10 (×2): 1 g via ORAL
  Filled 2018-07-08 (×2): qty 1

## 2018-07-08 MED ORDER — SODIUM CHLORIDE 0.9% FLUSH
3.0000 mL | Freq: Two times a day (BID) | INTRAVENOUS | Status: DC
  Administered 2018-07-08 – 2018-07-10 (×3): 3 mL via INTRAVENOUS

## 2018-07-08 MED ORDER — ACETAMINOPHEN 650 MG RE SUPP: 650.0000 mg | RECTAL | Status: DC | PRN

## 2018-07-08 MED ORDER — MENTHOL 3 MG MT LOZG: 1.0000 | LOZENGE | OROMUCOSAL | Status: DC | PRN

## 2018-07-08 MED ORDER — 0.9 % SODIUM CHLORIDE (POUR BTL) OPTIME
TOPICAL | Status: DC | PRN
  Administered 2018-07-08: 1000 mL

## 2018-07-08 MED ORDER — HYDROCODONE-ACETAMINOPHEN 5-325 MG PO TABS
2.0000 | ORAL_TABLET | ORAL | Status: DC | PRN
  Administered 2018-07-08 – 2018-07-10 (×5): 2 via ORAL
  Filled 2018-07-08 (×4): qty 2

## 2018-07-08 MED ORDER — DIPHENHYDRAMINE HCL 50 MG/ML IJ SOLN
INTRAMUSCULAR | Status: AC
  Filled 2018-07-08: qty 1

## 2018-07-08 SURGICAL SUPPLY — 89 items
ADH SKN CLS APL DERMABOND .7 (GAUZE/BANDAGES/DRESSINGS) ×1
BASKET BONE COLLECTION (BASKET) ×3 IMPLANT
BLADE CLIPPER SURG (BLADE) IMPLANT
BONE CANC CHIPS 20CC PCAN1/4 (Bone Implant) ×3 IMPLANT
BUR MATCHSTICK NEURO 3.0 LAGG (BURR) ×3 IMPLANT
BUR PRECISION FLUTE 5.0 (BURR) ×3 IMPLANT
CAGE COROENT MP 8X9X23M-8 SPIN (Cage) ×8 IMPLANT
CANISTER SUCT 3000ML PPV (MISCELLANEOUS) ×3 IMPLANT
CARTRIDGE OIL MAESTRO DRILL (MISCELLANEOUS) ×1 IMPLANT
CHIPS CANC BONE 20CC PCAN1/4 (Bone Implant) ×1 IMPLANT
CONT SPEC 4OZ CLIKSEAL STRL BL (MISCELLANEOUS) ×3 IMPLANT
COVER BACK TABLE 60X90IN (DRAPES) ×3 IMPLANT
DECANTER SPIKE VIAL GLASS SM (MISCELLANEOUS) ×5 IMPLANT
DERMABOND ADVANCED (GAUZE/BANDAGES/DRESSINGS) ×2
DERMABOND ADVANCED .7 DNX12 (GAUZE/BANDAGES/DRESSINGS) ×1 IMPLANT
DIFFUSER DRILL AIR PNEUMATIC (MISCELLANEOUS) ×3 IMPLANT
DRAPE C-ARM 42X72 X-RAY (DRAPES) ×3 IMPLANT
DRAPE C-ARMOR (DRAPES) ×3 IMPLANT
DRAPE HALF SHEET 40X57 (DRAPES) ×2 IMPLANT
DRAPE LAPAROTOMY 100X72X124 (DRAPES) ×3 IMPLANT
DRAPE SURG 17X23 STRL (DRAPES) ×3 IMPLANT
DRSG OPSITE POSTOP 4X8 (GAUZE/BANDAGES/DRESSINGS) ×2 IMPLANT
DURAPREP 26ML APPLICATOR (WOUND CARE) ×3 IMPLANT
ELECT REM PT RETURN 9FT ADLT (ELECTROSURGICAL) ×3
ELECTRODE REM PT RTRN 9FT ADLT (ELECTROSURGICAL) ×1 IMPLANT
EVACUATOR 1/8 PVC DRAIN (DRAIN) ×2 IMPLANT
GAUZE 4X4 16PLY RFD (DISPOSABLE) IMPLANT
GAUZE SPONGE 4X4 12PLY STRL (GAUZE/BANDAGES/DRESSINGS) ×1 IMPLANT
GAUZE SPONGE 4X4 16PLY XRAY LF (GAUZE/BANDAGES/DRESSINGS) ×2 IMPLANT
GLOVE BIO SURGEON STRL SZ8 (GLOVE) ×6 IMPLANT
GLOVE BIOGEL PI IND STRL 6.5 (GLOVE) IMPLANT
GLOVE BIOGEL PI IND STRL 7.0 (GLOVE) IMPLANT
GLOVE BIOGEL PI IND STRL 7.5 (GLOVE) IMPLANT
GLOVE BIOGEL PI IND STRL 8 (GLOVE) ×2 IMPLANT
GLOVE BIOGEL PI IND STRL 8.5 (GLOVE) ×2 IMPLANT
GLOVE BIOGEL PI INDICATOR 6.5 (GLOVE) ×2
GLOVE BIOGEL PI INDICATOR 7.0 (GLOVE) ×2
GLOVE BIOGEL PI INDICATOR 7.5 (GLOVE) ×6
GLOVE BIOGEL PI INDICATOR 8 (GLOVE) ×4
GLOVE BIOGEL PI INDICATOR 8.5 (GLOVE) ×4
GLOVE ECLIPSE 8.0 STRL XLNG CF (GLOVE) ×6 IMPLANT
GLOVE ECLIPSE 9.0 STRL (GLOVE) ×2 IMPLANT
GLOVE EXAM NITRILE LRG STRL (GLOVE) IMPLANT
GLOVE EXAM NITRILE XL STR (GLOVE) IMPLANT
GLOVE EXAM NITRILE XS STR PU (GLOVE) IMPLANT
GLOVE SURG SS PI 6.0 STRL IVOR (GLOVE) ×4 IMPLANT
GLOVE SURG SS PI 7.0 STRL IVOR (GLOVE) ×10 IMPLANT
GOWN STRL REUS W/ TWL LRG LVL3 (GOWN DISPOSABLE) IMPLANT
GOWN STRL REUS W/ TWL XL LVL3 (GOWN DISPOSABLE) ×2 IMPLANT
GOWN STRL REUS W/TWL 2XL LVL3 (GOWN DISPOSABLE) ×6 IMPLANT
GOWN STRL REUS W/TWL LRG LVL3 (GOWN DISPOSABLE) ×12
GOWN STRL REUS W/TWL XL LVL3 (GOWN DISPOSABLE) ×6
GRAFT BNE CANC CHIPS 1-8 20CC (Bone Implant) IMPLANT
HEMOSTAT POWDER KIT SURGIFOAM (HEMOSTASIS) ×3 IMPLANT
KIT BASIN OR (CUSTOM PROCEDURE TRAY) ×3 IMPLANT
KIT POSITION SURG JACKSON T1 (MISCELLANEOUS) ×3 IMPLANT
KIT TURNOVER KIT B (KITS) ×3 IMPLANT
MARKER SKIN DUAL TIP RULER LAB (MISCELLANEOUS) ×2 IMPLANT
MILL MEDIUM DISP (BLADE) ×2 IMPLANT
NDL HYPO 21X1.5 SAFETY (NEEDLE) IMPLANT
NDL HYPO 25X1 1.5 SAFETY (NEEDLE) ×1 IMPLANT
NDL SPNL 18GX3.5 QUINCKE PK (NEEDLE) IMPLANT
NEEDLE HYPO 21X1.5 SAFETY (NEEDLE) ×3 IMPLANT
NEEDLE HYPO 25X1 1.5 SAFETY (NEEDLE) ×3 IMPLANT
NEEDLE SPNL 18GX3.5 QUINCKE PK (NEEDLE) IMPLANT
NS IRRIG 1000ML POUR BTL (IV SOLUTION) ×3 IMPLANT
OIL CARTRIDGE MAESTRO DRILL (MISCELLANEOUS) ×3
PACK LAMINECTOMY NEURO (CUSTOM PROCEDURE TRAY) ×3 IMPLANT
PAD ARMBOARD 7.5X6 YLW CONV (MISCELLANEOUS) ×7 IMPLANT
PATTIES SURGICAL .5 X.5 (GAUZE/BANDAGES/DRESSINGS) IMPLANT
PATTIES SURGICAL .5 X1 (DISPOSABLE) IMPLANT
PATTIES SURGICAL 1X1 (DISPOSABLE) IMPLANT
ROD RELINE TI LORD 5.5X70 (Rod) ×4 IMPLANT
SCREW LOCK RELINE 5.5 TULIP (Screw) ×12 IMPLANT
SCREW RELINE-O POLY 6.5X45 (Screw) ×4 IMPLANT
SCREW RELINE-O POLY 6.5X50MM (Screw) ×8 IMPLANT
SPONGE LAP 4X18 RFD (DISPOSABLE) IMPLANT
SPONGE SURGIFOAM ABS GEL 100 (HEMOSTASIS) ×2 IMPLANT
STAPLER SKIN PROX WIDE 3.9 (STAPLE) IMPLANT
SUT VIC AB 1 CT1 18XBRD ANBCTR (SUTURE) ×2 IMPLANT
SUT VIC AB 1 CT1 8-18 (SUTURE) ×6
SUT VIC AB 2-0 CT1 18 (SUTURE) ×6 IMPLANT
SUT VIC AB 3-0 SH 8-18 (SUTURE) ×6 IMPLANT
SYR 20CC LL (SYRINGE) ×2 IMPLANT
SYR 5ML LL (SYRINGE) IMPLANT
TOWEL GREEN STERILE (TOWEL DISPOSABLE) ×3 IMPLANT
TOWEL GREEN STERILE FF (TOWEL DISPOSABLE) ×3 IMPLANT
TRAY FOLEY MTR SLVR 16FR STAT (SET/KITS/TRAYS/PACK) ×3 IMPLANT
WATER STERILE IRR 1000ML POUR (IV SOLUTION) ×3 IMPLANT

## 2018-07-08 NOTE — Transfer of Care (Signed)
Immediate Anesthesia Transfer of Care Note  Patient: Brian Fitzpatrick  Procedure(s) Performed: Decompressive laminectomy Lumbar two-three; Lumbar four-five Lumbar five Sacral one decompression and posterior lumbar interbody fusion (N/A Back)  Patient Location: PACU  Anesthesia Type:General  Level of Consciousness: awake and alert   Airway & Oxygen Therapy: Patient Spontanous Breathing and Patient connected to nasal cannula oxygen  Post-op Assessment: Report given to RN, Post -op Vital signs reviewed and stable and Patient moving all extremities X 4  Post vital signs: Reviewed and stable  Last Vitals:  Vitals Value Taken Time  BP    Temp    Pulse 73 07/08/2018 12:59 PM  Resp 19 07/08/2018 12:59 PM  SpO2 98 % 07/08/2018 12:59 PM  Vitals shown include unvalidated device data.  Last Pain:  Vitals:   07/08/18 0732  TempSrc:   PainSc: 0-No pain         Complications: No apparent anesthesia complications

## 2018-07-08 NOTE — Progress Notes (Signed)
Patient ID: Brian Fitzpatrick, male   DOB: 1934/04/30, 84 y.o.   MRN: [id number] Sitting up in chair, conversant, and reporting no pain. States, "I'm ready to go walking!". Good strength BLE. Doing well.

## 2018-07-08 NOTE — Interval H&P Note (Signed)
History and Physical Interval Note:  07/08/2018 8:01 AM  Brian Fitzpatrick  has presented today for surgery, with the diagnosis of Spinal stenosis of lumbar region with neurogenic claudication  The various methods of treatment have been discussed with the patient and family. After consideration of risks, benefits and other options for treatment, the patient has consented to  Procedure(s) with comments: Decompressive laminectomy Lumbar 2-3; Lumbar 4-5 Lumbar 5 Sacral 1 decompression and fusion (N/A) - Decompressive laminectomy Lumbar 2-3; Lumbar 4-5 Lumbar 5 Sacral 1 decompression and fusion as a surgical intervention .  The patient's history has been reviewed, patient examined, no change in status, stable for surgery.  I have reviewed the patient's chart and labs.  Questions were answered to the patient's satisfaction.     , D

## 2018-07-08 NOTE — Progress Notes (Signed)
Immediately postop, patient was somnolent, but arousable.  MAEW with good power.  Doing well.

## 2018-07-08 NOTE — Anesthesia Preprocedure Evaluation (Signed)
Anesthesia Evaluation  Patient identified by MRN, date of birth, ID band Patient awake    Reviewed: Allergy & Precautions, NPO status , Patient's Chart, lab work & pertinent test results  Airway Mallampati: II  TM Distance: >3 FB Neck ROM: Full    Dental no notable dental hx. (+) Dental Advisory Given   Pulmonary neg pulmonary ROS, former smoker,    Pulmonary exam normal        Cardiovascular + Peripheral Vascular Disease  Normal cardiovascular exam     Neuro/Psych negative neurological ROS  negative psych ROS   GI/Hepatic negative GI ROS, Neg liver ROS,   Endo/Other  diabetes  Renal/GU negative Renal ROS  negative genitourinary   Musculoskeletal negative musculoskeletal ROS (+)   Abdominal   Peds negative pediatric ROS (+)  Hematology negative hematology ROS (+)   Anesthesia Other Findings   Reproductive/Obstetrics negative OB ROS                             Anesthesia Physical Anesthesia Plan  ASA: III  Anesthesia Plan: General   Post-op Pain Management:    Induction: Intravenous  PONV Risk Score and Plan: 3 and Ondansetron, Dexamethasone and Diphenhydramine  Airway Management Planned: Oral ETT  Additional Equipment:   Intra-op Plan:   Post-operative Plan: Extubation in OR  Informed Consent: I have reviewed the patients History and Physical, chart, labs and discussed the procedure including the risks, benefits and alternatives for the proposed anesthesia with the patient or authorized representative who has indicated his/her understanding and acceptance.   Dental advisory given  Plan Discussed with: CRNA and Anesthesiologist  Anesthesia Plan Comments:         Anesthesia Quick Evaluation

## 2018-07-08 NOTE — Progress Notes (Signed)
PHARMACIST - PHYSICIAN ORDER COMMUNICATION  CONCERNING: P&T Medication Policy on Herbal Medications  DESCRIPTION:  This patient's order for: chromium, milk thistle, turmeric, Co Q 10, DHEA, krill  oil, resveratrol  has been noted.  This product(s) is classified as an "herbal" or natural product. Due to a lack of definitive safety studies or FDA approval, nonstandard manufacturing practices, plus the potential risk of unknown drug-drug interactions while on inpatient medications, the Pharmacy and Therapeutics Committee does not permit the use of "herbal" or natural products of this type within [facility].   ACTION TAKEN: The pharmacy department is unable to verify this order at this time.  Please reevaluate patient's clinical condition at discharge and address if the herbal or natural product(s) should be resumed at that time.   []   We informed your patient of this safety policy and the herbal therapy interruption while hospitalized.  Blenda Nicely, [facility] Clinical Pharmacist

## 2018-07-08 NOTE — Anesthesia Postprocedure Evaluation (Signed)
Anesthesia Post Note  Patient: Brian Fitzpatrick  Procedure(s) Performed: Decompressive laminectomy Lumbar two-three; Lumbar four-five Lumbar five Sacral one decompression and posterior lumbar interbody fusion (N/A Back)     Patient location during evaluation: PACU Anesthesia Type: General Level of consciousness: sedated Pain management: pain level controlled Vital Signs Assessment: post-procedure vital signs reviewed and stable Respiratory status: spontaneous breathing and respiratory function stable Cardiovascular status: stable Postop Assessment: no apparent nausea or vomiting Anesthetic complications: no    Last Vitals:  Vitals:   07/08/18 1415 07/08/18 1430  BP: (!) 160/82 (!) 151/86  Pulse: 79 76  Resp: 17 17  Temp:  36.5 C  SpO2: 98% 99%                 , DANIEL

## 2018-07-08 NOTE — H&P (Signed)
Patient ID:   [id number] Patient: Brian Fitzpatrick  Date of Birth: Oct 10, 1934 Visit Type: Office Visit   Date: 04/28/2018 09:45 AM Provider: Marchia Meiers. Vertell Limber MD   This 84 year old male presents for back pain.  HISTORY OF PRESENT ILLNESS:  1.  back pain  Patient returns reporting persistent pain both legs up to 8/10.  Conservative management including daily exercise is not offering pain relief.  He notes particularly increased pain mornings and evenings.  He has stopped golfing, hiking, and heavy workouts.  He continues core exercises, but notes increased pain following his workout.  He says at this point he does not feel he is doing well and is very frustrated and does want to go ahead with surgery.         Medical/Surgical/Interim History Reviewed, no change.  Last detailed document date:09/04/2017.     Family History:  Reviewed, no changes.  Last detailed document date:04/25/2016.   Social History: Reviewed, no changes. Last detailed document date: 04/25/2016.    MEDICATIONS: (added, continued or stopped this visit) Started Medication Directions Instruction Stopped   metformin  ORAL Take 1 tablet by mouth daily    01/13/2018 tramadol 50 mg tablet take 1 tablet by oral route  every 6 hours as needed       ALLERGIES: Ingredient Reaction Medication Name Comment  NO KNOWN ALLERGIES     No known allergies. Reviewed, no changes.    PHYSICAL EXAM:   Vitals Date Temp F BP Pulse Ht In Wt Lb BMI BSA Pain Score  04/28/2018  147/86 68 68 183 27.82  7/10      IMPRESSION:   The patient's MRI reveals degeneration and spinal stenosis at L2-3. I would recommend a L4-5 and L5-S1 decompression and fusion and an L2-3 laminectomy since the patient's conservative management is no longer working.  PLAN:  will schedule L4-5 and L5-S1 decompression and fusion and L2-3 laminectomy after patient discusses surgery with insurance.  This was previously approved by his insurance  carrier but then the patient decided not to go ahead with surgery.  He has pursued further conservative management but this is clearly not working.  Orders: Diagnostic Procedures: Assessment Procedure  M54.16 Lumbar Spine- AP/Lat  Instruction(s)/Education: Assessment Instruction  R03.0 Hypertension education  Z68.27 Dietary management education, guidance, and counseling   Completed Orders (this encounter) Order Details Reason Side Interpretation Result Initial Treatment Date Region  Hypertension education Continue to monitor blood pressure. If blood pressure remains elevated contact primary care provider        Dietary management education, guidance, and counseling patient encouraged to eat a well balanced diet         Assessment/Plan   # Detail Type Description   1. Assessment Spinal stenosis of lumbar region with neurogenic claudication (M48.062).       2. Assessment Radiculopathy, lumbar region (M54.16).       3. Assessment Spondylolisthesis, lumbar region (M43.16).       4. Assessment Elevated blood-pressure reading, w/o diagnosis of htn (R03.0).       5. Assessment Body mass index (BMI) 27.0-27.9, adult (T24.58).   Plan Orders Today's instructions / counseling include(s) Dietary management education, guidance, and counseling.         Pain Management Plan Pain Scale: 7/10. Method: Numeric Pain Intensity Scale. Location: back. Onset: 01/06/2016. Duration: varies. Quality: discomforting. Pain management follow-up plan of care: Patient taking medication as prescribed.Marland Kitchen  Provider:  Vertell Limber MD, Marchia Meiers 04/28/2018 1:14 PM  Dictation edited by: Dionne Bucy    CC Providers: [facility] [place] ,  [place]  [id number]-     [place]  [address] [place], [place] [address]              Electronically signed by Marchia Meiers. Vertell Limber MD on 04/30/2018 08:18 AM

## 2018-07-08 NOTE — Brief Op Note (Signed)
07/08/2018  12:52 PM  PATIENT:  Brian Fitzpatrick  84 y.o. male  PRE-OPERATIVE DIAGNOSIS:  Spinal stenosis of lumbar region with neurogenic claudication, spondylolisthesis, herniated lumbar disc, radiculopathy, lumbago  POST-OPERATIVE DIAGNOSIS:  Spinal stenosis of lumbar region with neurogenic claudication, spondylolisthesis, herniated lumbar disc, radiculopathy, lumbago  PROCEDURE:  Procedure(s): Decompressive laminectomy Lumbar two-three; Lumbar four-five Lumbar five Sacral one decompression and posterior lumbar interbody fusion (N/A) with PEEK cages, pedicle screw fixation and posterolateral arthrodesis  SURGEON:  Surgeon(s) and Role:    Erline Levine, MD - Primary    * Earnie Larsson, MD - Assisting  PHYSICIAN ASSISTANT:   ASSISTANTS: Poteat, RN   ANESTHESIA:   general  EBL:  300 mL   BLOOD ADMINISTERED:none  DRAINS: (Medium) Hemovact drain(s) in the epidural space with  Suction Open   LOCAL MEDICATIONS USED:  MARCAINE    and LIDOCAINE   SPECIMEN:  No Specimen  DISPOSITION OF SPECIMEN:  N/A  COUNTS:  YES  TOURNIQUET:  * No tourniquets in log *  DICTATION: DICTATION: Patient is 84 year old wman with  HNP, spondylosis, spondylolistheis, stenosis, DDD, radiculopathy L 23, L4/5, L5/S1. He has bilateral leg pain and weakness. It was elected to take him to surgery for decompression and fusion at L4/5 and L5/S1 levels with decompressive laminectomy L 2 - L 3 levels as well.    Procedure: Patient was placed in a prone position on the Vincent table after smooth and uncomplicated induction of general endotracheal anesthesia. His low back was prepped and draped in usual sterile fashion with betadine scrub and DuraPrep. Area of incision was infiltrated with local lidocaine. Incision was made to the lumbodorsal fascia was incised and exposure was performed of the L 23,  L4/5, L5/S1 spinous processes laminae facet joint and transverse processes. Intraoperative x-ray was obtained which  confirmed correct orientation. A total laminectomy of L4 and L5 was performed with disarticulation of the facet joints at this level and thorough decompression was performed of both L4, L5 and S1 nerve roots along with the common dural tube. There was densely adherent spondylytic material compressing the thecal sac and both L4 and L5 nerve roots.  Decompression was greater than would be typically performed for simple interbody fusion. A thorough discectomy and preparation of the endplates was performed at both the L4/5 and L5/S1 levels.  The interspaces were packed with  PEEK cages (8 x 23 mm x 8 degrees at L5/S1 and the same size at L4/5) . 10 cc Bone autograft was packed within the interspace medial to the second cage at each level. After a thorough decompression with bilateral discectomy at L4/5, bilateral 8 mm mm peek cages were packed with autograft and was inserted the interspace and countersunk appropriately. Laminectomy of L 2 and L 3 was then performed with thorough decompression of the thecal sac and bilateral L 2, L 3, L 4 nerve roots.  The posterolateral region was extensively decorticated and pedicle probes were placed at L4, L5 and S1 bilaterally. Intraoperative fluoroscopy confirmed correct orientationin the AP and lateral plane. 45 x 6.5 mm pedicle screws were placed at S1 bilaterally and 50 x 6.5 mm screws placed at L5 bilaterally and at L4 bilaterally.  Final x-rays demonstrated well-positioned interbody grafts and pedicle screw fixation. 70 mm lordotic rods were placed and locked down in situ and the posterolateral region was packed with the remaining bone graft  (30 cc on the right and 25 cc on the left). A medium Hemovac drain was placed  and anchored with a stitch.  Fascia was closed with 1 Vicryl sutures skin edges were reapproximated 2 and 3-0 Vicryl sutures. The wound is dressed with Dermabond and an occlusive dressing. Long-acting Marcaine was injected in the deep musculature.  The patient  was extubated in the operating room and taken to recovery in stable satisfactory condition having tolerated the operation well. Counts were correct at the end of the case.   PLAN OF CARE: Admit to inpatient   PATIENT DISPOSITION:  PACU - hemodynamically stable.   Delay start of Pharmacological VTE agent (>24hrs) due to surgical blood loss or risk of bleeding: yes

## 2018-07-08 NOTE — Anesthesia Procedure Notes (Signed)
Procedure Name: Intubation Date/Time: 07/08/2018 8:25 AM Performed by: Harden Mo, CRNA Pre-anesthesia Checklist: Patient identified, Emergency Drugs available, Suction available and Patient being monitored Patient Re-evaluated:Patient Re-evaluated prior to induction Oxygen Delivery Method: Circle System Utilized Preoxygenation: Pre-oxygenation with 100% oxygen Induction Type: IV induction Ventilation: Mask ventilation without difficulty Laryngoscope Size: Miller and 2 Grade View: Grade I Tube type: Oral Tube size: 7.5 mm Number of attempts: 1 Airway Equipment and Method: Stylet and Oral airway Placement Confirmation: ETT inserted through vocal cords under direct vision,  positive ETCO2 and breath sounds checked- equal and bilateral Secured at: 23 cm Tube secured with: Tape Dental Injury: Teeth and Oropharynx as per pre-operative assessment

## 2018-07-08 NOTE — Op Note (Signed)
07/08/2018  12:52 PM  PATIENT:  Brian Fitzpatrick  84 y.o. male  PRE-OPERATIVE DIAGNOSIS:  Spinal stenosis of lumbar region with neurogenic claudication, spondylolisthesis, herniated lumbar disc, radiculopathy, lumbago  POST-OPERATIVE DIAGNOSIS:  Spinal stenosis of lumbar region with neurogenic claudication, spondylolisthesis, herniated lumbar disc, radiculopathy, lumbago  PROCEDURE:  Procedure(s): Decompressive laminectomy Lumbar two-three; Lumbar four-five Lumbar five Sacral one decompression and posterior lumbar interbody fusion (N/A) with PEEK cages, pedicle screw fixation and posterolateral arthrodesis  SURGEON:  Surgeon(s) and Role:    Erline Levine, MD - Primary    * Earnie Larsson, MD - Assisting  PHYSICIAN ASSISTANT:   ASSISTANTS: Poteat, RN   ANESTHESIA:   general  EBL:  300 mL   BLOOD ADMINISTERED:none  DRAINS: (Medium) Hemovact drain(s) in the epidural space with  Suction Open   LOCAL MEDICATIONS USED:  MARCAINE    and LIDOCAINE   SPECIMEN:  No Specimen  DISPOSITION OF SPECIMEN:  N/A  COUNTS:  YES  TOURNIQUET:  * No tourniquets in log *  DICTATION: DICTATION: Patient is 84 year old wman with  HNP, spondylosis, spondylolistheis, stenosis, DDD, radiculopathy L 23, L4/5, L5/S1. He has bilateral leg pain and weakness. It was elected to take him to surgery for decompression and fusion at L4/5 and L5/S1 levels with decompressive laminectomy L 2 - L 3 levels as well.    Procedure: Patient was placed in a prone position on the [place] table after smooth and uncomplicated induction of general endotracheal anesthesia. His low back was prepped and draped in usual sterile fashion with betadine scrub and DuraPrep. Area of incision was infiltrated with local lidocaine. Incision was made to the lumbodorsal fascia was incised and exposure was performed of the L 23,  L4/5, L5/S1 spinous processes laminae facet joint and transverse processes. Intraoperative x-ray was obtained which  confirmed correct orientation. A total laminectomy of L4 and L5 was performed with disarticulation of the facet joints at this level and thorough decompression was performed of both L4, L5 and S1 nerve roots along with the common dural tube. There was densely adherent spondylytic material compressing the thecal sac and both L4 and L5 nerve roots.  Decompression was greater than would be typically performed for simple interbody fusion. A thorough discectomy and preparation of the endplates was performed at both the L4/5 and L5/S1 levels.  The interspaces were packed with  PEEK cages (8 x 23 mm x 8 degrees at L5/S1 and the same size at L4/5) . 10 cc Bone autograft was packed within the interspace medial to the second cage at each level. After a thorough decompression with bilateral discectomy at L4/5, bilateral 8 mm mm peek cages were packed with autograft and was inserted the interspace and countersunk appropriately. Laminectomy of L 2 and L 3 was then performed with thorough decompression of the thecal sac and bilateral L 2, L 3, L 4 nerve roots.  The posterolateral region was extensively decorticated and pedicle probes were placed at L4, L5 and S1 bilaterally. Intraoperative fluoroscopy confirmed correct orientationin the AP and lateral plane. 45 x 6.5 mm pedicle screws were placed at S1 bilaterally and 50 x 6.5 mm screws placed at L5 bilaterally and at L4 bilaterally.  Final x-rays demonstrated well-positioned interbody grafts and pedicle screw fixation. 70 mm lordotic rods were placed and locked down in situ and the posterolateral region was packed with the remaining bone graft  (30 cc on the right and 25 cc on the left). A medium Hemovac drain was placed  and anchored with a stitch.  Fascia was closed with 1 Vicryl sutures skin edges were reapproximated 2 and 3-0 Vicryl sutures. The wound is dressed with Dermabond and an occlusive dressing. Long-acting Marcaine was injected in the deep musculature.  The patient  was extubated in the operating room and taken to recovery in stable satisfactory condition having tolerated the operation well. Counts were correct at the end of the case.   PLAN OF CARE: Admit to inpatient   PATIENT DISPOSITION:  PACU - hemodynamically stable.   Delay start of Pharmacological VTE agent (>24hrs) due to surgical blood loss or risk of bleeding: yes

## 2018-07-09 MED FILL — Thrombin (Recombinant) For Soln 20000 Unit: CUTANEOUS | Qty: 1 | Status: AC

## 2018-07-09 NOTE — Evaluation (Signed)
Physical Therapy Evaluation Patient Details Name: Brian Fitzpatrick MRN: [id number] DOB: 03-01-1934 Today's Date: 07/09/2018   History of Present Illness  Pt is an 84 y/o male with the diagnosis of Spinal stenosis of lumbar region with neurogenic claudication. pt is now s/p decompressive laminectomy L2-3; L4-5, L5-S1decompression and posterior lumbar interbody fusion pedicle screw fixation and posterolateral arthrodesis on 07/08/18   Clinical Impression  Pt is at or close to baseline functioning and should be safe at home . There are no further acute PT needs.  Will sign off at this time.     Follow Up Recommendations No PT follow up    Equipment Recommendations  None recommended by PT    Recommendations for Other Services       Precautions / Restrictions Precautions Precautions: Fall;Back Precaution Booklet Issued: Yes (comment) Precaution Comments: issued and reviewed  Required Braces or Orthoses: Spinal Brace Spinal Brace: Lumbar corset;Applied in sitting position Restrictions Weight Bearing Restrictions: No      Mobility  Bed Mobility Overal bed mobility: Needs Assistance Bed Mobility: Rolling;Sidelying to Sit;Sit to Sidelying Rolling: Supervision Sidelying to sit: Supervision     Sit to sidelying: Supervision General bed mobility comments: demonstrated safe technique and pt return demo'd  Transfers Overall transfer level: Needs assistance Equipment used: None Transfers: Sit to/from Stand Sit to Stand: Supervision         General transfer comment: for safety  Ambulation/Gait Ambulation/Gait assistance: Supervision Gait Distance (Feet): 300 Feet Assistive device: None Gait Pattern/deviations: Step-through pattern   Gait velocity interpretation: 1.31 - 2.62 ft/sec, indicative of limited community ambulator General Gait Details: steady, but slower  Stairs Stairs: Yes Stairs assistance: Supervision Stair Management: One rail Right;Alternating pattern;Step  to pattern;Forwards Number of Stairs: 3 General stair comments: steady with rail  Wheelchair Mobility    Modified Rankin (Stroke Patients Only)       Balance Overall balance assessment: Needs assistance Sitting-balance support: Feet supported Sitting balance-Leahy Scale: Good       Standing balance-Leahy Scale: Normal                               Pertinent Vitals/Pain Pain Assessment: Faces Faces Pain Scale: Hurts even more Pain Location: back at incision site  Pain Descriptors / Indicators: Grimacing;Guarding;Sore Pain Intervention(s): Monitored during session    Home Living Family/patient expects to be discharged to:: Private residence Living Arrangements: Spouse/significant other;Children Available Help at Discharge: Family Type of Home: House Home Access: Stairs to enter   Technical brewer of Steps: 1 and then 1 more to enter  Home Layout: One level Home Equipment: None      Prior Function Level of Independence: Independent               Hand Dominance        Extremity/Trunk Assessment   Upper Extremity Assessment Upper Extremity Assessment: Defer to OT evaluation    Lower Extremity Assessment Lower Extremity Assessment: Overall WFL for tasks assessed       Communication   Communication: No difficulties  Cognition Arousal/Alertness: Awake/alert Behavior During Therapy: Impulsive Overall Cognitive Status: No family/caregiver present to determine baseline cognitive functioning                                 General Comments: pt with impulsivities noted during mobility and throughout session, requires cues for adhering to precautions during  functional tasks; pt also with some decreased STM during session, repeating information he had already provided to therapist       General Comments General comments (skin integrity, edema, etc.): pt/wife instructed on back care/prec, log roll, lifting restrictions and  progression of activity.    Exercises     Assessment/Plan    PT Assessment Patent does not need any further PT services  PT Problem List         PT Treatment Interventions      PT Goals (Current goals can be found in the Care Plan section)  Acute Rehab PT Goals Patient Stated Goal: maintain independence PT Goal Formulation: All assessment and education complete, DC therapy    Frequency     Barriers to discharge        Co-evaluation               AM-PAC PT "6 Clicks" Daily Activity  Outcome Measure Difficulty turning over in bed (including adjusting bedclothes, sheets and blankets)?: None Difficulty moving from lying on back to sitting on the side of the bed? : None Difficulty sitting down on and standing up from a chair with arms (e.g., wheelchair, bedside commode, etc,.)?: None Help needed moving to and from a bed to chair (including a wheelchair)?: None Help needed walking in hospital room?: None Help needed climbing 3-5 steps with a railing? : None 6 Click Score: 24    End of Session   Activity Tolerance: Patient tolerated treatment well Patient left: in chair;with call bell/phone within reach Nurse Communication: Mobility status PT Visit Diagnosis: Other abnormalities of gait and mobility (R26.89);Pain Pain - part of body: (back)    Time: 5681-2751 PT Time Calculation (min) (ACUTE ONLY): 27 min   Charges:   PT Evaluation $PT Eval Low Complexity: 1 Low PT Treatments $Gait Training: 8-22 mins        07/09/2018  Donnella Sham, PT [phone]  (pager)  Tessie Fass  07/09/2018, 5:50 PM

## 2018-07-09 NOTE — Progress Notes (Signed)
Pt wanted to ambulate in hall. Used small brace he usually uses when he exercises. He reports having large black brace but this nurse was unable to locate it.Pt was able to do 2 laps areound unit including the ICU side. We discussed using opiates for pain relief and using them appropriately. He expressed an interest in having some but he was experiencing some nausea despite receiving zofran IV. Will continue to monitor. Katherina Right RN

## 2018-07-09 NOTE — Evaluation (Signed)
Occupational Therapy Evaluation Patient Details Name: Brian Fitzpatrick MRN: [id number] DOB: 1934-02-03 Today's Date: 07/09/2018    History of Present Illness Pt is an 84 y/o male with the diagnosis of Spinal stenosis of lumbar region with neurogenic claudication. pt is now s/p decompressive laminectomy L2-3; L4-5, L5-S1decompression and posterior lumbar interbody fusion pedicle screw fixation and posterolateral arthrodesis on 07/08/18    Clinical Impression   This 84 y/o male presents with the above. At baseline pt is independent with ADLs, iADLs and functional mobility, was active and driving. Pt currently requiring supervision-minguard assist for functional mobility without AD, requires setup assist for UB ADL, modA for LB ADLs secondary to adhering to back precautions. Educated pt on back precautions, brace management, AE, safety and compensatory strategies for completing ADLs while adhering to precautions. Pt overall is moving well, though does demonstrate slight impulsivity with mobility and requires min cues to follow precautions during functional tasks. Pt will benefit from continued OT services while he remains in acute setting to maximize his overall safety and independence with ADLs and mobility. Pt reports he will return home with spouse who is able to assist with ADLs PRN after return home. Will follow.     Follow Up Recommendations  No OT follow up;Supervision/Assistance - 24 hour    Equipment Recommendations  3 in 1 bedside commode           Precautions / Restrictions Precautions Precautions: Fall;Back Precaution Booklet Issued: Yes (comment) Precaution Comments: issued and reviewed  Required Braces or Orthoses: Spinal Brace Spinal Brace: Lumbar corset;Applied in sitting position Restrictions Weight Bearing Restrictions: No      Mobility Bed Mobility               General bed mobility comments: OOB in chair upon arrival   Transfers Overall transfer level: Needs  assistance Equipment used: None Transfers: Sit to/from Stand Sit to Stand: Supervision         General transfer comment: for safety    Balance Overall balance assessment: Mild deficits observed, not formally tested                                         ADL either performed or assessed with clinical judgement   ADL Overall ADL's : Needs assistance/impaired Eating/Feeding: Independent;Sitting   Grooming: Min guard;Standing   Upper Body Bathing: Min guard;Sitting   Lower Body Bathing: Minimal assistance;Sit to/from stand;Cueing for back precautions   Upper Body Dressing : Set up;Min guard;Sitting   Lower Body Dressing: Moderate assistance;Sit to/from stand Lower Body Dressing Details (indicate cue type and reason): increased assist dueto difficulty accessing LEs while maintaining back precautions; educated on use of AE for completing LB ADLs with pt return demonstrating understanding Toilet Transfer: Min guard;Ambulation;Regular Glass blower/designer Details (indicate cue type and reason): educated on use of 3:1 BSC over toilet as pt reports he has low toilets at home, pt in agreement  Toileting- Water quality scientist and Hygiene: Min guard;Sit to/from stand Toileting - Clothing Manipulation Details (indicate cue type and reason): educated on use of toilet aide to increase independence and adherence to back precautions      Functional mobility during ADLs: Min guard;Cueing for safety General ADL Comments: educated on back precautions, AE, safety and compensatory strategies for completing ADLs and functional transfers while maintaining precautions     Vision  Perception     Praxis      Pertinent Vitals/Pain Pain Assessment: Faces Faces Pain Scale: Hurts even more Pain Location: back at incision site  Pain Descriptors / Indicators: Grimacing;Guarding;Sore Pain Intervention(s): Monitored during session;Limited activity within patient's  tolerance     Hand Dominance     Extremity/Trunk Assessment Upper Extremity Assessment Upper Extremity Assessment: Overall WFL for tasks assessed   Lower Extremity Assessment Lower Extremity Assessment: Defer to PT evaluation       Communication Communication Communication: No difficulties   Cognition Arousal/Alertness: Awake/alert Behavior During Therapy: Impulsive Overall Cognitive Status: No family/caregiver present to determine baseline cognitive functioning                                 General Comments: pt with impulsivities noted during mobility and throughout session, requires cues for adhering to precautions during functional tasks; pt also with some decreased STM during session, repeating information he had already provided to therapist    General Comments       Exercises     Shoulder Instructions      Home Living Family/patient expects to be discharged to:: Private residence Living Arrangements: Spouse/significant other;Children Available Help at Discharge: Family Type of Home: House Home Access: Stairs to enter [employer] of Steps: 1 and then 1 more to enter    Home Layout: One level     Bathroom Shower/Tub: Walk-in shower;Tub/shower unit   Bathroom Toilet: Standard(reports they are "low" toilets )     Home Equipment: None          Prior Functioning/Environment Level of Independence: Independent                 OT Problem List: Decreased strength;Impaired balance (sitting and/or standing);Pain;Decreased range of motion;Decreased safety awareness;Decreased knowledge of use of DME or AE;Decreased activity tolerance      OT Treatment/Interventions: Self-care/ADL training;DME and/or AE instruction;Therapeutic activities;Balance training;Therapeutic exercise;Patient/family education    OT Goals(Current goals can be found in the care plan section) Acute Rehab OT Goals Patient Stated Goal: maintain independence OT  Goal Formulation: With patient Time For Goal Achievement: 07/23/18 Potential to Achieve Goals: Good  OT Frequency: Min 2X/week   Barriers to D/C:            Co-evaluation              AM-PAC PT "6 Clicks" Daily Activity     Outcome Measure Help from another person eating meals?: None Help from another person taking care of personal grooming?: A Little Help from another person toileting, which includes using toliet, bedpan, or urinal?: A Little Help from another person bathing (including washing, rinsing, drying)?: A Little Help from another person to put on and taking off regular upper body clothing?: None Help from another person to put on and taking off regular lower body clothing?: A Little 6 Click Score: 20   End of Session Equipment Utilized During Treatment: Back brace Nurse Communication: Mobility status  Activity Tolerance: Patient tolerated treatment well Patient left: in chair;with call bell/phone within reach;with family/visitor present(son arriving end of session )  OT Visit Diagnosis: Other abnormalities of gait and mobility (R26.89);Pain Pain - part of body: (back )                Time: 1400-1437 OT Time Calculation (min): 37 min Charges:  OT General Charges $OT Visit: 1 Visit OT Evaluation $OT Eval Low Complexity:  1 Low OT Treatments $Self Care/Home Management : 8-22 mins  Lou Cal, [place] Pager [phone] 07/09/2018   Raymondo Band 07/09/2018, 4:02 PM

## 2018-07-09 NOTE — Social Work (Signed)
CSW acknowledging consult for SNF placement, await therapy recommendations for insurance authorization purposes in SNF recommended.   Alexander Mt, [place] Work [phone]

## 2018-07-09 NOTE — Progress Notes (Addendum)
Subjective: Patient reports "I've been up walking"  Objective: Vital signs in last 24 hours: Temp:  [97.2 F (36.2 C)-98.4 F (36.9 C)] 98.4 F (36.9 C) (08/28 0612) Pulse Rate:  [66-84] 66 (08/28 0612) Resp:  [12-19] 14 (08/28 0612) BP: (110-160)/(61-88) 110/62 (08/28 0612) SpO2:  [94 %-100 %] 94 % (08/27 2121) Weight:  [81.7 kg] 81.7 kg (08/28 0612)  Intake/Output from previous day: 08/27 0701 - 08/28 0700 In: 2948.7 [P.O.:200; I.V.:2648.7; IV Piggyback:100] Out: 9924 [Urine:845; Drains:2150; Blood:300] Intake/Output this shift: No intake/output data recorded.  Alert, conversant, sitting in chair. Reports lumbar pain with position changes only. Awaiting his LSO, being brought from home; but currently using elastic lumbar support. Incision flat without erythema, drainage, or swelling beneath honeycomb and Dermabond. Hemovac 266ml start of night shift and 214ml end of night shift. Good strength BLE. No leg pain or numbness repported.  Lab Results: No results for input(s): WBC, HGB, HCT, PLT in the last 72 hours. BMET No results for input(s): NA, K, CL, CO2, GLUCOSE, BUN, CREATININE, CALCIUM in the last 72 hours.  Studies/Results: Dg Lumbar Spine 2-3 Views  Result Date: 07/08/2018 CLINICAL DATA:  L4-5 and L5-S1 PLIF EXAM: DG C-ARM 61-120 MIN; LUMBAR SPINE - 2-3 VIEW COMPARISON:  12/03/2017 MRI FINDINGS: Standard number scheme is again used. There is L4-5 and L5-S1 discectomy with cage and pedicle screw placement. No unexpected finding. IMPRESSION: Fluoroscopy for L4-5 and L5-S1 PLIF. Electronically Signed   By: Monte Fantasia M.D.   On: 07/08/2018 12:41   Dg Lumbar Spine 2-3 Views  Result Date: 07/08/2018 CLINICAL DATA:  Decompressive laminectomy L2-3, L4-5, and L5-S1. EXAM: LUMBAR SPINE - 2-3 VIEW COMPARISON:  Lumbar spine MRI 12/03/2017. FINDINGS: Two intraoperative images are submitted. Image number 1 demonstrates surgical probes at the L5 and S1 spinous process. Image number 2  demonstrates surgical probes at L3, L4, L5, and S1. IMPRESSION: 1. Intraoperative localization L3-S1. Electronically Signed   By: San Morelle M.D.   On: 07/08/2018 10:07   Dg C-arm 1-60 Min  Result Date: 07/08/2018 CLINICAL DATA:  L4-5 and L5-S1 PLIF EXAM: DG C-ARM 61-120 MIN; LUMBAR SPINE - 2-3 VIEW COMPARISON:  12/03/2017 MRI FINDINGS: Standard number scheme is again used. There is L4-5 and L5-S1 discectomy with cage and pedicle screw placement. No unexpected finding. IMPRESSION: Fluoroscopy for L4-5 and L5-S1 PLIF. Electronically Signed   By: Monte Fantasia M.D.   On: 07/08/2018 12:41    Assessment/Plan: Improving  LOS: 1 day  Encourage mobility in brace and for patient to work with PT.  Keep drain in place today.  Anticipate discharge in AM.   [place], Aaron Edelman 07/09/2018, 8:04 AM

## 2018-07-09 NOTE — Progress Notes (Signed)
Patient in the hall ambulating at change of shift, alone, with walker, no brace.   Spoke with Aaron Edelman, Dr. Melven Sartorius RN said that we will keep the drain in until after  lunch possibly and set to discharge later this afternoon.   Upon assessment patient states that Dr. Vertell Limber has been in to see him and since patient stated "pain is more intense today than yesterday, patient may stay another evening, even though patient is stating to Nurse that he wants to go home".  Patient up in the chair for breakfast.   Patient out in the hallway ambulating one more time after breakfast.

## 2018-07-09 NOTE — Progress Notes (Signed)
Pt reports he does not want the medications prescribed to him while in the hospital except for the antibiotic. He states he will take what he needs at home. He also reports he does not take pain medications and will not be taking any now. Pt ambulated in room for about 15 minutes while this nurse was with him. He used the front wheel walker. Will continue to monitor. Katherina Right RN

## 2018-07-10 MED ORDER — HYDROCODONE-ACETAMINOPHEN 5-325 MG PO TABS: 1.0000 | ORAL_TABLET | ORAL | 0 refills | Status: DC | PRN

## 2018-07-10 MED ORDER — METHOCARBAMOL 500 MG PO TABS: 500.0000 mg | ORAL_TABLET | Freq: Four times a day (QID) | ORAL | 2 refills | Status: DC | PRN

## 2018-07-10 MED FILL — Heparin Sodium (Porcine) Inj 1000 Unit/ML: INTRAMUSCULAR | Qty: 30 | Status: AC

## 2018-07-10 MED FILL — Sodium Chloride IV Soln 0.9%: INTRAVENOUS | Qty: 1000 | Status: AC

## 2018-07-10 NOTE — Progress Notes (Signed)
Occupational Therapy Treatment Patient Details Name: Brian Fitzpatrick MRN: [id number] DOB: 1933-11-17 Today's Date: 07/10/2018    History of present illness Pt is an 84 y/o male with the diagnosis of Spinal stenosis of lumbar region with neurogenic claudication. pt is now s/p decompressive laminectomy L2-3; L4-5, L5-S1decompression and posterior lumbar interbody fusion pedicle screw fixation and posterolateral arthrodesis on 07/08/18    OT comments  Pt progressing towards OT goals this session. Focus was dressing in preparation for discharge. Pt able to use AE to perform UB and LB dressing with mod cues to maintain back precautions and compensatory strategies. Emphasis on back precautions during functional ADL and maintenance for 6-8 weeks. Back handout reviewed adls in detail. Pt educated on: clothing between brace, never sleep in brace, set an alarm at night for medication, avoid sitting for long periods of time, correct bed positioning for sleeping, correct sequence for bed mobility, avoiding lifting more than 5 pounds and never wash directly over incision. All education is complete and patient indicates understanding.    Follow Up Recommendations  No OT follow up;Supervision/Assistance - 24 hour    Equipment Recommendations  3 in 1 bedside commode    Recommendations for Other Services      Precautions / Restrictions Precautions Precautions: Fall;Back Precaution Booklet Issued: Yes (comment) Precaution Comments: reviewed, Pt able to recall 3/3 Required Braces or Orthoses: Spinal Brace Spinal Brace: Lumbar corset;Applied in sitting position Restrictions Weight Bearing Restrictions: No       Mobility Bed Mobility               General bed mobility comments: Pt sitting OOB in recliner upon arrival  Transfers Overall transfer level: Needs assistance Equipment used: None Transfers: Sit to/from Stand Sit to Stand: Supervision              Balance Overall balance  assessment: Needs assistance Sitting-balance support: Feet supported Sitting balance-Leahy Scale: Good       Standing balance-Leahy Scale: Normal                             ADL either performed or assessed with clinical judgement   ADL Overall ADL's : Needs assistance/impaired                 Upper Body Dressing : Set up;Min guard;Sitting Upper Body Dressing Details (indicate cue type and reason): able to don shirt and brace with set up Lower Body Dressing: Sit to/from stand;Min guard;With adaptive equipment;Cueing for sequencing;Cueing for compensatory techniques;Cueing for back precautions Lower Body Dressing Details (indicate cue type and reason): Pt able to use AE to don shorts, underwear, socks and shoes with multiple cues for back precautions Toilet Transfer: Min guard;Ambulation           Functional mobility during ADLs: Min guard;Cueing for safety       Vision       Perception     Praxis      Cognition Arousal/Alertness: Awake/alert Behavior During Therapy: Impulsive Overall Cognitive Status: No family/caregiver present to determine baseline cognitive functioning                                 General Comments: Pt eager to please and prove independence - requiring mod cues for compliance with precautions        Exercises     Shoulder Instructions  General Comments      Pertinent Vitals/ Pain       Pain Assessment: Faces Faces Pain Scale: Hurts little more Pain Location: back at incision site  Pain Descriptors / Indicators: Grimacing;Guarding;Sore Pain Intervention(s): Monitored during session;Repositioned  Home Living                                          Prior Functioning/Environment              Frequency  Min 2X/week        Progress Toward Goals  OT Goals(current goals can now be found in the care plan section)  Progress towards OT goals: Progressing toward  goals  Acute Rehab OT Goals Patient Stated Goal: maintain independence OT Goal Formulation: With patient Time For Goal Achievement: 07/23/18 Potential to Achieve Goals: Good  Plan Discharge plan remains appropriate;Frequency remains appropriate    Co-evaluation                 AM-PAC PT "6 Clicks" Daily Activity     Outcome Measure   Help from another person eating meals?: None Help from another person taking care of personal grooming?: A Little Help from another person toileting, which includes using toliet, bedpan, or urinal?: A Little Help from another person bathing (including washing, rinsing, drying)?: A Little Help from another person to put on and taking off regular upper body clothing?: None Help from another person to put on and taking off regular lower body clothing?: A Little 6 Click Score: 20    End of Session Equipment Utilized During Treatment: Back brace  OT Visit Diagnosis: Other abnormalities of gait and mobility (R26.89);Pain Pain - part of body: (back)   Activity Tolerance Patient tolerated treatment well   Patient Left in chair;with call bell/phone within reach;with family/visitor present   Nurse Communication Mobility status        Time: 1610-9604 OT Time Calculation (min): 24 min  Charges: OT General Charges $OT Visit: 1 Visit OT Treatments $Self Care/Home Management : 23-37 mins  Hulda Humphrey OTR/L Acute Rehabilitation Services Pager: [phone] Office: [facility] 07/10/2018, 5:01 PM

## 2018-07-10 NOTE — Progress Notes (Signed)
Pt being discharged home via wheelchair with family. Pt alert and oriented x4. VSS. Pt c/o no pain at this time. No signs of respiratory distress. Education complete and care plans resolved. IV removed with catheter intact and pt tolerated well. Hemovac removed with no complications. No further issues at this time. Pt to follow up with PCP. Leanne Chang, RN

## 2018-07-10 NOTE — Consult Note (Signed)
[facility] CM Primary Care Navigator  07/10/2018  Brian Fitzpatrick 06-Aug-1934 [id number]      Met withpatientand wife Brian Fitzpatrick- retired RN)at the [place] [facility] identify possible discharge needs. Patientreports having"severe sharp back pains radiating to both legs- more to left" thatresulted to this admission/ surgery.(Spinal stenosis of lumbar region with neurogenic claudication, spondylolisthesis, herniated lumbar disc, radiculopathy, lumbago, status postdecompressive laminectomy L2-3 L4-5, L5-S1 decompression and posterior lumbar interbody fusion)  Patientendorses Dr.John Laurann Fitzpatrick with [name] as hisprimary care provider.   [facility] [address] [facility] difficulty.   Patient's wife reportsmanaginghismedications at [employer] out of the containers ("not taking much").  Patient verbalized that he has been driving prior to admission/ surgery but statesthat wife will be able to providetransportation to hisdoctors'appointments after discharge.  Patientliveswith wife who will serve as his primary caregiver at home.  Anticipateddischarge planishomeaccording to wife/ patient.  Patientand wifevoiced understanding to call primary care provider's office when he returnshomefor a post discharge follow-up visit within1- 2weeksor sooner if needs arise.Patient letter (with PCP's contact number) was provided astheirreminder.   Discussed with patientand wife about[facility] services available for health management and resources at home butboth denied any needs or concerns at this time.Patient thinks he does not need the servicesat this point, since wife has been helping him manage his health issues so far. Both are knowledgeable on ways to manage chronic health issues (mainly DM) at home.   Encouraged patientand wifeto seekreferral from primary care provider to [facility]  care management if deemednecessaryand appropriate for any services in the future.  [facility] care management information was provided for future needs thatpatientmay have.  Patienthad opted and verbally agreed forEMMI calls to follow-upwith hisrecovery at home.  Referral made for [facility] General calls after discharge.   For additional questions please contact:  Edwena Felty A. , BSN, RN-BC [facility] PRIMARY CARE Navigator Cell: [phone]

## 2018-07-10 NOTE — Progress Notes (Addendum)
Subjective: Patient reports "I'm having some back pain, but I've been walking"  Objective: Vital signs in last 24 hours: Temp:  [97.9 F (36.6 C)-98.7 F (37.1 C)] 98.7 F (37.1 C) (08/29 0215) Pulse Rate:  [66-91] 68 (08/28 2034) Resp:  [12] 12 (08/28 0800) BP: (107-145)/(76-91) 131/77 (08/28 2035) SpO2:  [94 %-100 %] 100 % (08/28 2034)  Intake/Output from previous day: 08/28 0701 - 08/29 0700 In: 890.9 [P.O.:740; IV Piggyback:150.9] Out: 420 [Drains:420] Intake/Output this shift: No intake/output data recorded.  Alert, conversant. Reports more incisional pain than yesterday, particularly with position changes - reassured. Hemovac 160ml o/p overnight. Incision flat without erythema, swelling, or draionage beneath honeycomb and Dermabond. Good strength BLE.  Lab Results: No results for input(s): WBC, HGB, HCT, PLT in the last 72 hours. BMET No results for input(s): NA, K, CL, CO2, GLUCOSE, BUN, CREATININE, CALCIUM in the last 72 hours.  Studies/Results: Dg Lumbar Spine 2-3 Views  Result Date: 07/08/2018 CLINICAL DATA:  L4-5 and L5-S1 PLIF EXAM: DG C-ARM 61-120 MIN; LUMBAR SPINE - 2-3 VIEW COMPARISON:  12/03/2017 MRI FINDINGS: Standard number scheme is again used. There is L4-5 and L5-S1 discectomy with cage and pedicle screw placement. No unexpected finding. IMPRESSION: Fluoroscopy for L4-5 and L5-S1 PLIF. Electronically Signed   By: Monte Fantasia M.D.   On: 07/08/2018 12:41   Dg Lumbar Spine 2-3 Views  Result Date: 07/08/2018 CLINICAL DATA:  Decompressive laminectomy L2-3, L4-5, and L5-S1. EXAM: LUMBAR SPINE - 2-3 VIEW COMPARISON:  Lumbar spine MRI 12/03/2017. FINDINGS: Two intraoperative images are submitted. Image number 1 demonstrates surgical probes at the L5 and S1 spinous process. Image number 2 demonstrates surgical probes at L3, L4, L5, and S1. IMPRESSION: 1. Intraoperative localization L3-S1. Electronically Signed   By: San Morelle M.D.   On: 07/08/2018 10:07    Dg C-arm 1-60 Min  Result Date: 07/08/2018 CLINICAL DATA:  L4-5 and L5-S1 PLIF EXAM: DG C-ARM 61-120 MIN; LUMBAR SPINE - 2-3 VIEW COMPARISON:  12/03/2017 MRI FINDINGS: Standard number scheme is again used. There is L4-5 and L5-S1 discectomy with cage and pedicle screw placement. No unexpected finding. IMPRESSION: Fluoroscopy for L4-5 and L5-S1 PLIF. Electronically Signed   By: Monte Fantasia M.D.   On: 07/08/2018 12:41    Assessment/Plan: Improving  LOS: 2 days  Per DrStern, D/C drain. D/C IV, and D/C to home. Pt verbalizes understanding of d/c instructions and agrees to call office to schedule f/u appt. Rx's for Norco and Robaxin for home use.   Verdis Prime 07/10/2018, 7:29 AM   Patient is doing well.  Discharge home.

## 2018-07-10 NOTE — Progress Notes (Signed)
PT was given opportunity to have a shower, once before tech left at 2300 07/09/18 and again 0400 07/10/18 by this nurse. He discussed it with this nurse then ultimately decided she did not want take a shower at that time. He would like drain removed prior to shower. Will continue to monitor. Katherina Right RN

## 2018-07-10 NOTE — Discharge Summary (Signed)
Physician Discharge Summary  Patient ID: Brian Fitzpatrick MRN: [id number] DOB/AGE: [birth year] 84 y.o.  Admit date: 07/08/2018 Discharge date: 07/10/2018  Admission Diagnoses: Spinal stenosis of lumbar region with neurogenic claudication, spondylolisthesis, herniated lumbar disc, radiculopathy, lumbago    Discharge Diagnoses: Spinal stenosis of lumbar region with neurogenic claudication, spondylolisthesis, herniated lumbar disc, radiculopathy, lumbago s/p Decompressive laminectomy Lumbar two-three; Lumbar four-five Lumbar five Sacral one decompression and posterior lumbar interbody fusion (N/A) with PEEK cages, pedicle screw fixation and posterolateral arthrodesis     Active Problems:   Spondylolisthesis of lumbar region   Discharged Condition: good  Hospital Course: Brian Fitzpatrick was admitted with dx spondylolisthesis, stenosis and radiculopathy. Following uncomplicated surgery, he recovered nicely and transferred to [facility] for nursing care and therapies. He is mobilizing well.   Consults: None  Significant Diagnostic Studies: radiology: X-Ray: intra-op  Treatments: surgery: Decompressive laminectomy Lumbar two-three; Lumbar four-five Lumbar five Sacral one decompression and posterior lumbar interbody fusion (N/A) with PEEK cages, pedicle screw fixation and posterolateral arthrodesis    Discharge Exam: Blood pressure 131/77, pulse 68, temperature 98.7 F (37.1 C), temperature source Axillary, resp. rate 12, height 5\' 8"  (1.727 m), weight 81.7 kg, SpO2 100 %. Alert, conversant. Reports more incisional pain than yesterday, particularly with position changes - reassured. Hemovac 146ml o/p overnight. Incision flat without erythema, swelling, or draionage beneath honeycomb and Dermabond. Good strength BLE.    Disposition: Discharge disposition: 01-Home or Self Care  Pt verbalizes understanding of d/c instructions and agrees to call office to schedule f/u appt. Rx's for  Norco and Robaxin for home use.       Allergies as of 07/10/2018   No Known Allergies     Medication List    TAKE these medications   b complex vitamins tablet Take 1 tablet by mouth daily.   B-12 2500 MCG Tabs Take 2,500 mcg by mouth 2 (two) times daily.   BRAIN Tabs Take 1 tablet by mouth 2 (two) times daily.   Chromium 1 MG Caps Take 1 capsule by mouth daily.   Co Q 10 100 MG Caps Take 100 mg by mouth 2 (two) times daily.   DHEA 50 MG Tabs Take 50 mg by mouth daily.   GLUCOSAMINE-MSM-HYALURONIC ACD PO Take 1 tablet by mouth 2 (two) times daily.   HYDROcodone-acetaminophen 5-325 MG tablet Commonly known as:  NORCO/VICODIN Take 1 tablet by mouth every 4 (four) hours as needed for moderate pain or severe pain ((score 7 to 10)).   hydrocortisone 2.5 % cream Apply 1 application topically daily as needed for itching.   Krill Oil 500 MG Caps Take 500 mg by mouth daily.   metFORMIN 500 MG tablet Commonly known as:  GLUCOPHAGE Take 500 mg by mouth daily.   methocarbamol 500 MG tablet Commonly known as:  ROBAXIN Take 1 tablet (500 mg total) by mouth every 6 (six) hours as needed for muscle spasms.   milk thistle 175 MG tablet Take 175 mg by mouth 2 (two) times daily.   OCUVITE-LUTEIN PO Take 1 tablet by mouth daily.   Omega-3 1000 MG Caps Take 1,000 mg by mouth 2 (two) times daily.   OVER THE COUNTER MEDICATION Take 1 tablet by mouth 2 (two) times daily. Prostate health supplement   OVER THE COUNTER MEDICATION Take 560 tablets by mouth 2 (two) times daily. Hawthorne Berry supplement   Resveratrol 250 MG Caps Take 250 mg by mouth daily.   Selenium 100 MCG Caps Take 100 mcg by mouth  daily.   Taurine 1000 MG Caps Take 1,000 mg by mouth daily.   Turmeric Curcumin 500 MG Caps Take 500-1,000 mg by mouth See admin instructions. Take 1000 mg in the morning and 500 in the evening   vitamin C 500 MG tablet Commonly known as:  ASCORBIC ACID Take  1,000 mg by mouth 2 (two) times daily.   Vitamin D 2000 units Caps Take 2,000-4,000 Units by mouth See admin instructions. Take 4000 units in the morning and 2000 units in the evening        Signed: Peggyann Shoals, MD 07/10/2018, 7:42 AM

## 2018-07-15 ENCOUNTER — Other Ambulatory Visit: Payer: Self-pay

## 2018-07-15 NOTE — Patient Outreach (Signed)
[facility]) Care Management  07/15/2018  DONN ZANETTI 05-13-1934 [id number]     EMMI-General Discharge RED ON EMMI ALERT Day # 4 Date: 07/14/18 Red Alert Reason: " Scheduled follow up? No"   Outreach attempt # 1 to patient. Spoke with patient. He reports that he is still experiencing a lot of pain following back surgery a week ago. He shares his pain regimen and is trying to take pain meds as ordered and to keep medication in system. RN CM provided reinforcement regarding pain mgmt. He does state that MD is aware of pain and changed his meds a few days ago and he noticed some improvement. He complains of "hiccups and burping constantly." RN CM noted patient with hiccupping throughout call. He states he was given a new med to help with that. He voices that he sustained a fall last week but otherwise has been getting around fairly well some assistance. He has called MD office today to schedule follow up appt and is awaiting a return call. He has asked if he could be seen sooner to be evaluated. He reports that spouse will be taking him to appts. He denies any issues or concerns regarding medication. He denies any RN CM needs or concerns. Patient voices some frustration and annoyance with all the multiple calls he has gotten since discharge and said he "just wants to rest." Advised patient that he has completed post discharge EMMI-General automated calls. He voiced understanding and was appreciative of follow up call.      Plan: RN CM will close case as no further intervention needed at this time.  Enzo Montgomery, RN,BSN,CCM [place] Management Telephonic Care Management Coordinator Direct Phone: [phone] Toll Free: [phone] Fax: [phone]

## 2018-08-05 ENCOUNTER — Other Ambulatory Visit: Payer: Self-pay

## 2018-08-05 NOTE — Patient Outreach (Signed)
[facility]) Care Management  [place]   08/05/2018  Brian Fitzpatrick 1934/04/21 [id number]   84 year old male outreached by [facility] services for a 30 day post discharge medication review.  PMHx includes, but not limited to, type 2 diabetes mellitus and dyslipidemia.  Successful outreach attempt to Brian Fitzpatrick.  HIPAA identifiers verified.   Subjective: Brian Fitzpatrick states that he is "getting around well" after his recent back surgery.  He states that he walks twice a day, but usually experiences back fatigue when he is finished.  He states that he has trouble getting comfortable at night and takes a hydrocodone/APAP, so he can lay in his bed.   Patient states that he does not take methocarbamol because he doesn't "see what it does for me."  Mr. Mclester states that he sees his doctor tomorrow and would like a Physical Therapy referral.  He states that he may check his CBG once week.     Objective:  Labs on 07/01/18 HgA1c 6.4%  SCr 1.24 mg/dL   Current Medications: Current Outpatient Medications  Medication Sig Dispense Refill  . b complex vitamins tablet Take 1 tablet by mouth daily.    . Cholecalciferol (VITAMIN D) 2000 units CAPS Take 2,000-4,000 Units by mouth See admin instructions. Take 4000 units in the morning and 2000 units in the evening    . Chromium 1 MG CAPS Take 1 capsule by mouth daily.    . Coenzyme Q10 (CO Q 10) 100 MG CAPS Take 100 mg by mouth 2 (two) times daily.    . Cyanocobalamin (B-12) 2500 MCG TABS Take 2,500 mcg by mouth 2 (two) times daily.    Marland Kitchen DHEA 50 MG TABS Take 50 mg by mouth daily.    Marland Kitchen GLUCOSAMINE-MSM-HYALURONIC ACD PO Take 1 tablet by mouth 2 (two) times daily.    Marland Kitchen HYDROcodone-acetaminophen (NORCO/VICODIN) 5-325 MG tablet Take 1 tablet by mouth every 4 (four) hours as needed for moderate pain or severe pain ((score 7 to 10)). 60 tablet 0  . metFORMIN (GLUCOPHAGE) 500 MG tablet Take 500 mg by mouth daily.     . methocarbamol (ROBAXIN)  500 MG tablet Take 1 tablet (500 mg total) by mouth every 6 (six) hours as needed for muscle spasms. 60 tablet 2  . milk thistle 175 MG tablet Take 175 mg by mouth 2 (two) times daily.    . Multiple Vitamins-Minerals (OCUVITE-LUTEIN PO) Take 1 tablet by mouth daily.    . Omega-3 1000 MG CAPS Take 1,000 mg by mouth 2 (two) times daily.    Marland Kitchen OVER THE COUNTER MEDICATION Take 1 tablet by mouth 2 (two) times daily. Prostate health supplement    . OVER THE COUNTER MEDICATION Take 560 tablets by mouth 2 (two) times daily. Hawthorne Berry supplement     . Selenium 100 MCG CAPS Take 100 mcg by mouth daily.    Marland Kitchen Specialty Vitamins Products (BRAIN) TABS Take 1 tablet by mouth 2 (two) times daily.    . vitamin C (ASCORBIC ACID) 500 MG tablet Take 1,000 mg by mouth 2 (two) times daily.     Javier Docker Oil 500 MG CAPS Take 500 mg by mouth daily.    Marland Kitchen Resveratrol 250 MG CAPS Take 250 mg by mouth daily.    . Taurine 1000 MG CAPS Take 1,000 mg by mouth daily.    . Turmeric Curcumin 500 MG CAPS Take 500-1,000 mg by mouth See admin instructions. Take 1000 mg in  the morning and 500 in the evening     No current facility-administered medications for this visit.     Functional Status: In your present state of health, do you have any difficulty performing the following activities: 07/08/2018 07/01/2018  Hearing? N N  Comment - SLIGHTLY DIMINISHED  Vision? N N  Difficulty concentrating or making decisions? N N  Walking or climbing stairs? N N  Dressing or bathing? N N  Doing errands, shopping? N N  Some recent data might be hidden    Fall/Depression Screening: No flowsheet data found. No flowsheet data found.  ASSESSMENT: Date Discharged from Hospital: 07/10/18 Date Medication Reconciliation Performed: 08/05/2018  New Medications at Discharge:   Hydrocodone/APAP  Methocarbamol   Patient was recently discharged from hospital and all medications have been reviewed  Drugs sorted by system:  Endocrine:  metformin  Pain: hydrocodone/APAP  Vitamins/Minerals/Supplements: B complex, cholecalciferol, chromium, coenzyme Q-10, cyanocobalamin, glucosamine, krill oil, milk thistle, MVI, omega-3, brain vitamin, resveratrol, selenium, taurine, tumeric, vitamin C, Hawthorn Berry, DHEA  Miscellaneous: methacarbamol  Medications to avoid in the elderly:  Per the [place]ers List, methocarbamol may be poorly tolerated by older adults.  Muscle relaxants may have anticholinergic adverse effects, sedation, increased risk of fractures and effectiveness at dosages tolerated by older adults is questionable.  There is strong evidence to avoid use in the elderly  Other issues noted:  Counseled patient to take his methocarbamol after his walks to see if that will help with his back fatigue.    PLAN: Route note to PCP, Dr. Laurann Montana.   Joetta Manners, PharmD Clinical Pharmacist [place] [phone]

## 2018-08-06 DIAGNOSIS — M5416 Radiculopathy, lumbar region: Secondary | ICD-10-CM | POA: Diagnosis not present

## 2018-08-28 ENCOUNTER — Ambulatory Visit: Payer: PPO | Admitting: Podiatry

## 2018-08-28 ENCOUNTER — Encounter: Payer: Self-pay | Admitting: Podiatry

## 2018-08-28 VITALS — BP 145/86 | HR 69

## 2018-08-28 DIAGNOSIS — L600 Ingrowing nail: Secondary | ICD-10-CM

## 2018-08-28 DIAGNOSIS — M79676 Pain in unspecified toe(s): Secondary | ICD-10-CM

## 2018-08-28 MED ORDER — NEOMYCIN-POLYMYXIN-HC 3.5-10000-1 OT SOLN: OTIC | 0 refills | Status: DC

## 2018-08-28 NOTE — Patient Instructions (Signed)

## 2018-09-02 DIAGNOSIS — Z1389 Encounter for screening for other disorder: Secondary | ICD-10-CM | POA: Diagnosis not present

## 2018-09-02 DIAGNOSIS — E119 Type 2 diabetes mellitus without complications: Secondary | ICD-10-CM | POA: Diagnosis not present

## 2018-09-02 DIAGNOSIS — Z0000 Encounter for general adult medical examination without abnormal findings: Secondary | ICD-10-CM | POA: Diagnosis not present

## 2018-09-02 DIAGNOSIS — M48061 Spinal stenosis, lumbar region without neurogenic claudication: Secondary | ICD-10-CM | POA: Diagnosis not present

## 2018-09-02 DIAGNOSIS — Z125 Encounter for screening for malignant neoplasm of prostate: Secondary | ICD-10-CM | POA: Diagnosis not present

## 2018-09-11 ENCOUNTER — Ambulatory Visit (INDEPENDENT_AMBULATORY_CARE_PROVIDER_SITE_OTHER): Payer: Self-pay

## 2018-09-11 DIAGNOSIS — L600 Ingrowing nail: Secondary | ICD-10-CM

## 2018-09-23 NOTE — Progress Notes (Signed)
Patient is here today for follow-up appointment, recent procedure performed on 08/28/2018, removal of ingrown toenail.  He states that he feels like everything is healing really well, and he does not have any complications with it.  No redness, no erythema, no drainage, no swelling, no other signs symptoms of infection.  The area appears to be scabbing over and healing well.  Discussed signs and symptoms of infection.  Verbal and written instructions were given to the patient.  He is to follow-up as needed with any acute symptom changes.

## 2018-10-02 DIAGNOSIS — M48061 Spinal stenosis, lumbar region without neurogenic claudication: Secondary | ICD-10-CM | POA: Diagnosis not present

## 2018-10-23 DIAGNOSIS — M545 Low back pain: Secondary | ICD-10-CM | POA: Diagnosis not present

## 2018-10-29 DIAGNOSIS — M545 Low back pain: Secondary | ICD-10-CM | POA: Diagnosis not present

## 2018-11-13 DIAGNOSIS — M545 Low back pain: Secondary | ICD-10-CM | POA: Diagnosis not present

## 2018-11-20 DIAGNOSIS — M545 Low back pain: Secondary | ICD-10-CM | POA: Diagnosis not present

## 2018-11-27 DIAGNOSIS — M545 Low back pain: Secondary | ICD-10-CM | POA: Diagnosis not present

## 2018-12-01 DIAGNOSIS — M48062 Spinal stenosis, lumbar region with neurogenic claudication: Secondary | ICD-10-CM | POA: Diagnosis not present

## 2018-12-01 DIAGNOSIS — M5416 Radiculopathy, lumbar region: Secondary | ICD-10-CM | POA: Diagnosis not present

## 2018-12-01 DIAGNOSIS — R030 Elevated blood-pressure reading, without diagnosis of hypertension: Secondary | ICD-10-CM | POA: Diagnosis not present

## 2018-12-01 DIAGNOSIS — M4316 Spondylolisthesis, lumbar region: Secondary | ICD-10-CM | POA: Diagnosis not present

## 2018-12-07 NOTE — Progress Notes (Signed)
Subjective:  Patient ID: Brian Fitzpatrick, male    DOB: [birth year],  MRN: [id number]  Chief Complaint  Patient presents with  . Nail Problem    bilateral great toes both sides; pt stated, "L is worse, red around the top"    85 y.o. male presents with the above complaint.   Review of Systems: Negative except as noted in the HPI. Denies N/V/F/Ch.  Past Medical History:  Diagnosis Date  . Arthritis   . BPH (benign prostatic hyperplasia)   . Diabetes mellitus    Borderline    DX 2009  . Vertigo    left ear sets it off/ has "crystals" in it    Current Outpatient Medications:  .  b complex vitamins tablet, Take 1 tablet by mouth daily., Disp: , Rfl:  .  Cholecalciferol (VITAMIN D) 2000 units CAPS, Take 2,000-4,000 Units by mouth See admin instructions. Take 4000 units in the morning and 2000 units in the evening, Disp: , Rfl:  .  Chromium 1 MG CAPS, Take 1 capsule by mouth daily., Disp: , Rfl:  .  Coenzyme Q10 (CO Q 10) 100 MG CAPS, Take 100 mg by mouth 2 (two) times daily., Disp: , Rfl:  .  Cyanocobalamin (B-12) 2500 MCG TABS, Take 2,500 mcg by mouth 2 (two) times daily., Disp: , Rfl:  .  DHEA 50 MG TABS, Take 50 mg by mouth daily., Disp: , Rfl:  .  GLUCOSAMINE-MSM-HYALURONIC ACD PO, Take 1 tablet by mouth 2 (two) times daily., Disp: , Rfl:  .  Krill Oil 500 MG CAPS, Take 500 mg by mouth daily., Disp: , Rfl:  .  metFORMIN (GLUCOPHAGE) 500 MG tablet, Take 500 mg by mouth daily. , Disp: , Rfl:  .  milk thistle 175 MG tablet, Take 175 mg by mouth 2 (two) times daily., Disp: , Rfl:  .  Multiple Vitamins-Minerals (OCUVITE-LUTEIN PO), Take 1 tablet by mouth daily., Disp: , Rfl:  .  Omega-3 1000 MG CAPS, Take 1,000 mg by mouth 2 (two) times daily., Disp: , Rfl:  .  OVER THE COUNTER MEDICATION, Take 1 tablet by mouth 2 (two) times daily. Prostate health supplement, Disp: , Rfl:  .  OVER THE COUNTER MEDICATION, Take 560 tablets by mouth 2 (two) times daily. Hawthorne Berry supplement , Disp: ,  Rfl:  .  Resveratrol 250 MG CAPS, Take 250 mg by mouth daily., Disp: , Rfl:  .  Selenium 100 MCG CAPS, Take 100 mcg by mouth daily., Disp: , Rfl:  .  Specialty Vitamins Products (BRAIN) TABS, Take 1 tablet by mouth 2 (two) times daily., Disp: , Rfl:  .  Taurine 1000 MG CAPS, Take 1,000 mg by mouth daily., Disp: , Rfl:  .  Turmeric Curcumin 500 MG CAPS, Take 500-1,000 mg by mouth See admin instructions. Take 1000 mg in the morning and 500 in the evening, Disp: , Rfl:  .  vitamin C (ASCORBIC ACID) 500 MG tablet, Take 1,000 mg by mouth 2 (two) times daily. , Disp: , Rfl:  .  HYDROcodone-acetaminophen (NORCO/VICODIN) 5-325 MG tablet, Take 1 tablet by mouth every 4 (four) hours as needed for moderate pain or severe pain ((score 7 to 10))., Disp: 60 tablet, Rfl: 0 .  methocarbamol (ROBAXIN) 500 MG tablet, Take 1 tablet (500 mg total) by mouth every 6 (six) hours as needed for muscle spasms., Disp: 60 tablet, Rfl: 2 .  neomycin-polymyxin-hydrocortisone (CORTISPORIN) OTIC solution, Apply 1-2 drops to toe after soaking twice a day, Disp: 10  mL, Rfl: 0  Social History   Tobacco Use  Smoking Status Former Smoker  . Types: Cigarettes  Smokeless Tobacco Never Used  Tobacco Comment   45 YRS AGO.    No Known Allergies Objective:   Vitals:   08/28/18 1601  BP: (!) 145/86  Pulse: 69   There is no height or weight on file to calculate BMI. Constitutional Well developed. Well nourished.  Vascular Dorsalis pedis pulses palpable bilaterally. Posterior tibial pulses palpable bilaterally. Capillary refill normal to all digits.  No cyanosis or clubbing noted. Pedal hair growth normal.  Neurologic Normal speech. Oriented to person, place, and time. Epicritic sensation to light touch grossly present bilaterally.  Dermatologic Painful ingrowing nail at both nail borders of the hallux nail bilaterally. No other open wounds. No skin lesions.  Orthopedic: Normal joint ROM without pain or crepitus  bilaterally. No visible deformities. No bony tenderness.   Radiographs: None Assessment:   1. Ingrown nail   2. Pain around toenail    Plan:  Patient was evaluated and treated and all questions answered.  Ingrown Nail, bilaterally -Patient elects to proceed with minor surgery to remove ingrown toenail removal today. Consent reviewed and signed by patient. -Ingrown nail excised. See procedure note. -Educated on post-procedure care including soaking. Written instructions provided and reviewed. -Patient to follow up in 2 weeks for nail check.  Procedure: Excision of Ingrown Toenail Location: Bilateral 1st both nail borders. Anesthesia: Lidocaine 1% plain; 1.5 mL and Marcaine 0.5% plain; 1.5 mL, digital block. Skin Prep: Betadine. Dressing: Silvadene; telfa; dry, sterile, compression dressing. Technique: Following skin prep, the toe was exsanguinated and a tourniquet was secured at the base of the toe. The affected nail border was freed, split with a nail splitter, and excised. The tourniquet was then removed and sterile dressing applied. Disposition: Patient tolerated procedure well. Patient to return in 2 weeks for follow-up.   Return in about 2 weeks (around 09/11/2018) for Nail Check.

## 2018-12-08 DIAGNOSIS — M545 Low back pain: Secondary | ICD-10-CM | POA: Diagnosis not present

## 2018-12-09 DIAGNOSIS — M19049 Primary osteoarthritis, unspecified hand: Secondary | ICD-10-CM | POA: Diagnosis not present

## 2018-12-09 DIAGNOSIS — M79641 Pain in right hand: Secondary | ICD-10-CM | POA: Diagnosis not present

## 2018-12-25 DIAGNOSIS — M545 Low back pain: Secondary | ICD-10-CM | POA: Diagnosis not present

## 2019-01-01 DIAGNOSIS — L812 Freckles: Secondary | ICD-10-CM | POA: Diagnosis not present

## 2019-01-01 DIAGNOSIS — L821 Other seborrheic keratosis: Secondary | ICD-10-CM | POA: Diagnosis not present

## 2019-01-01 DIAGNOSIS — L570 Actinic keratosis: Secondary | ICD-10-CM | POA: Diagnosis not present

## 2019-01-02 DIAGNOSIS — M545 Low back pain: Secondary | ICD-10-CM | POA: Diagnosis not present

## 2019-01-09 ENCOUNTER — Other Ambulatory Visit (HOSPITAL_COMMUNITY): Payer: Self-pay | Admitting: Internal Medicine

## 2019-01-09 ENCOUNTER — Other Ambulatory Visit: Payer: Self-pay | Admitting: Internal Medicine

## 2019-01-09 DIAGNOSIS — G459 Transient cerebral ischemic attack, unspecified: Secondary | ICD-10-CM

## 2019-01-13 ENCOUNTER — Encounter (INDEPENDENT_AMBULATORY_CARE_PROVIDER_SITE_OTHER): Payer: Self-pay

## 2019-01-13 ENCOUNTER — Ambulatory Visit (HOSPITAL_COMMUNITY): Payer: PPO | Attending: Internal Medicine

## 2019-01-13 DIAGNOSIS — G459 Transient cerebral ischemic attack, unspecified: Secondary | ICD-10-CM | POA: Diagnosis not present

## 2019-01-15 ENCOUNTER — Other Ambulatory Visit: Payer: Self-pay | Admitting: Internal Medicine

## 2019-01-15 DIAGNOSIS — G459 Transient cerebral ischemic attack, unspecified: Secondary | ICD-10-CM

## 2019-01-15 DIAGNOSIS — E7800 Pure hypercholesterolemia, unspecified: Secondary | ICD-10-CM | POA: Diagnosis not present

## 2019-01-16 ENCOUNTER — Ambulatory Visit
Admission: RE | Admit: 2019-01-16 | Discharge: 2019-01-16 | Disposition: A | Discharge status: Home / Self Care | Payer: PPO | Source: Ambulatory Visit | Attending: Internal Medicine | Admitting: Internal Medicine

## 2019-01-16 DIAGNOSIS — G459 Transient cerebral ischemic attack, unspecified: Secondary | ICD-10-CM

## 2019-01-16 DIAGNOSIS — I6523 Occlusion and stenosis of bilateral carotid arteries: Secondary | ICD-10-CM | POA: Diagnosis not present

## 2019-01-20 ENCOUNTER — Ambulatory Visit
Admission: RE | Admit: 2019-01-20 | Discharge: 2019-01-20 | Disposition: A | Discharge status: Home / Self Care | Payer: PPO | Source: Ambulatory Visit | Attending: Internal Medicine | Admitting: Internal Medicine

## 2019-01-20 DIAGNOSIS — I63533 Cerebral infarction due to unspecified occlusion or stenosis of bilateral posterior cerebral arteries: Secondary | ICD-10-CM | POA: Diagnosis not present

## 2019-01-20 DIAGNOSIS — I63512 Cerebral infarction due to unspecified occlusion or stenosis of left middle cerebral artery: Secondary | ICD-10-CM | POA: Diagnosis not present

## 2019-01-20 DIAGNOSIS — G459 Transient cerebral ischemic attack, unspecified: Secondary | ICD-10-CM

## 2019-01-20 MED ORDER — GADOBENATE DIMEGLUMINE 529 MG/ML IV SOLN
17.0000 mL | Freq: Once | INTRAVENOUS | Status: AC | PRN
  Administered 2019-01-20: 17 mL via INTRAVENOUS

## 2019-01-22 ENCOUNTER — Encounter: Payer: Self-pay | Admitting: Neurology

## 2019-01-22 DIAGNOSIS — E7800 Pure hypercholesterolemia, unspecified: Secondary | ICD-10-CM | POA: Diagnosis not present

## 2019-01-22 DIAGNOSIS — G459 Transient cerebral ischemic attack, unspecified: Secondary | ICD-10-CM | POA: Diagnosis not present

## 2019-01-22 DIAGNOSIS — I679 Cerebrovascular disease, unspecified: Secondary | ICD-10-CM | POA: Diagnosis not present

## 2019-01-24 ENCOUNTER — Other Ambulatory Visit: Payer: PPO

## 2019-02-23 NOTE — Consult Note (Signed)
Virtual Visit via Video Note The purpose of this virtual visit is to provide medical care while limiting exposure to the novel coronavirus.    Consent was obtained for video visit:  Yes Answered questions that patient had about telehealth interaction:  Yes I discussed the limitations, risks, security and privacy concerns of performing an evaluation and management service by telemedicine. I also discussed with the patient that there may be a patient responsible charge related to this service. The patient expressed understanding and agreed to proceed.  Pt location: Home Physician Location: office Name of referring provider:  Lavone Orn, MD I connected with Brian Fitzpatrick at patients initiation/request on 02/24/2019 at 11:10 AM EDT by video enabled telemedicine application and verified that I am speaking with the correct person using two identifiers. Pt MRN:  [id number] Pt DOB:  1933/12/02 Video Participants:  Brian Fitzpatrick   History of Present Illness:  Brian Fitzpatrick is an 85 year old Caucasian man who presents for TIA.  History supplemented by PCP note.  In late February, he had an episode of speech disturbance.  He was able to get words out but the words were jumbled.  He was able to understand others.  There was no associated slurred speech, facial droop, dizziness or unilateral numbness or weakness.  Episode lasted about 7-8 minutes.  Later that evening, he started having a dull daily headache that was treated well with Tylenol.  He was started on ASA 81mg  daily and atorvastatin 10mg  daily by his PCP.  He had a 2D echocardiogram on 01/13/19 which demonstrated a normal LV EF 60-65% with no cardiac source of embolus.  Carotid doppler from 01/16/19 showed mild bilateral carotid atherosclerosis with less than 50% stenosis bilaterally as well as antegrade flow of both vertebral arteries.  MRI of brain with and without contrast from 01/20/19 was personally reviewed and demonstrated moderate chronic  small vessel ischemic changes but no acute intracranial abnormality.  MRA of head showed intracranial atherosclerotic arterial disease with moderate bilateral proximal M2 stenoses and severe bilateral P1 and P2 stenoses.  Then a little over a week ago, he had a second event.  He had the same language disturbance but also had visual disturbance as well.  He was reading a sign when his vision was obscured by dark spots in his vision.  After discussion with me, Dr. Laurann [place] started him on Plavix 75mg  daily in addition to the ASA.  He also is taking atorvastatin 10mg  daily.  He has had no recurrent events.  Headaches have since resolved.  He is a former smoker.  He has type 2 diabetes.  His sister had passed away from strokes.  He denies history of migraines.  Past Medical History: Past Medical History:  Diagnosis Date  . Arthritis   . BPH (benign prostatic hyperplasia)   . Diabetes mellitus    Borderline    DX 2009  . Vertigo    left ear sets it off/ has "crystals" in it    Medications: Outpatient Encounter Medications as of 02/24/2019  Medication Sig Note  . atorvastatin (LIPITOR) 10 MG tablet TK 1 T PO QD   . b complex vitamins tablet Take 1 tablet by mouth daily.   . Cholecalciferol (VITAMIN D) 2000 units CAPS Take 2,000-4,000 Units by mouth See admin instructions. Take 4000 units in the morning and 2000 units in the evening   . Chromium 1 MG CAPS Take 1 capsule by mouth daily.   . clopidogrel (PLAVIX) 75  MG tablet TK 1 T PO QD   . Coenzyme Q10 (CO Q 10) 100 MG CAPS Take 100 mg by mouth 2 (two) times daily.   . Cyanocobalamin (B-12) 2500 MCG TABS Take 2,500 mcg by mouth 2 (two) times daily.   Marland Kitchen DHEA 50 MG TABS Take 50 mg by mouth daily.   Marland Kitchen GLUCOSAMINE-MSM-HYALURONIC ACD PO Take 1 tablet by mouth 2 (two) times daily.   Marland Kitchen HYDROcodone-acetaminophen (NORCO/VICODIN) 5-325 MG tablet Take 1 tablet by mouth every 4 (four) hours as needed for moderate pain or severe pain ((score 7 to 10)).   Marland Kitchen  metFORMIN (GLUCOPHAGE) 500 MG tablet Take 500 mg by mouth daily.    . methocarbamol (ROBAXIN) 500 MG tablet Take 1 tablet (500 mg total) by mouth every 6 (six) hours as needed for muscle spasms.   . milk thistle 175 MG tablet Take 175 mg by mouth 2 (two) times daily.   . Multiple Vitamins-Minerals (OCUVITE-LUTEIN PO) Take 1 tablet by mouth daily.   Marland Kitchen neomycin-polymyxin-hydrocortisone (CORTISPORIN) OTIC solution Apply 1-2 drops to toe after soaking twice a day   . Omega-3 1000 MG CAPS Take 1,000 mg by mouth 2 (two) times daily. 06/25/2018: On hold for procedure   . OVER THE COUNTER MEDICATION Take 1 tablet by mouth 2 (two) times daily. Prostate health supplement   . OVER THE COUNTER MEDICATION Take 560 tablets by mouth 2 (two) times daily. Hawthorne Berry supplement    . Resveratrol 250 MG CAPS Take 250 mg by mouth daily.   . Selenium 100 MCG CAPS Take 100 mcg by mouth daily.   Marland Kitchen Specialty Vitamins Products (BRAIN) TABS Take 1 tablet by mouth 2 (two) times daily.   . Taurine 1000 MG CAPS Take 1,000 mg by mouth daily.   . Turmeric Curcumin 500 MG CAPS Take 500-1,000 mg by mouth See admin instructions. Take 1000 mg in the morning and 500 in the evening   . vitamin C (ASCORBIC ACID) 500 MG tablet Take 1,000 mg by mouth 2 (two) times daily.    . [DISCONTINUED] Krill Oil 500 MG CAPS Take 500 mg by mouth daily. 06/25/2018: Takes both krill and omega 3 On hold for procedure   No facility-administered encounter medications on file as of 02/24/2019.     Allergies: No Known Allergies  Family History: Family History  Problem Relation Age of Onset  . Colon cancer Sister   . Colon cancer Brother   . Colon cancer Other     Social History: Social History   Socioeconomic History  . Marital status: Married    Spouse name: [facility]  . Number of children: 3  . Years of education: Not on file  . Highest education level: Not on file  Occupational History    Comment: Retired  Scientific laboratory technician  .  Financial resource strain: Not on file  . Food insecurity:    Worry: Not on file    Inability: Not on file  . Transportation needs:    Medical: Not on file    Non-medical: Not on file  Tobacco Use  . Smoking status: Former Smoker    Types: Cigarettes  . Smokeless tobacco: Never Used  . Tobacco comment: 45 YRS AGO.  Substance and Sexual Activity  . Alcohol use: Yes    Alcohol/week: 3.0 standard drinks    Types: 3 Glasses of wine per week    Comment: Occasional  . Drug use: No  . Sexual activity: Not on file  Lifestyle  . Physical activity:    Days per week: Not on file    Minutes per session: Not on file  . Stress: Not on file  Relationships  . Social connections:    Talks on phone: Not on file    Gets together: Not on file    Attends religious service: Not on file    Active member of club or organization: Not on file    Attends meetings of clubs or organizations: Not on file    Relationship status: Not on file  . Intimate partner violence:    Fear of current or ex partner: Not on file    Emotionally abused: Not on file    Physically abused: Not on file    Forced sexual activity: Not on file  Other Topics Concern  . Not on file  Social History Narrative  . Not on file   Observations/Objective:   Blood pressure (!) 204/100. alert and oriented to person, place, and time. Attention span and concentration intact, recent and remote memory intact, fund of knowledge intact.  Speech fluent and not dysarthric, language intact.  Eyes orthophoric and move in all directions.  Face symmetric.  No pronator drift.  Romberg negative.  Assessment and Plan:   1.  Probable recurrent transient ischemic attack.  MRI/MRA does reveal chronic small vessel and intracranial atherosclerotic disease. Other risk factors include type 2 diabetes and former smoker. 2.  Type 2 diabetes mellitus 3.  Elevated blood pressure reading  1.  Continue dual antiplatelet therapy (ASA 81mg  and Plavix 75mg   daily) for 3 weeks, then discontinue ASA and continue Plavix alone for secondary stroke prevention. 2.  Continue atorvastatin 10mg  daily.  Check lipid panel in 2 months.  He would like it performed at Dr. Delene Ruffini office. 3.  Recheck blood pressure later.   If still elevated, contact Dr. Delene Ruffini office. 4.  Optimize glycemic control 5.  Would like to check 24 hour Holter Monitor to evaluate for atrial fibrillation 6.  Follow up in 4 months.  Follow Up Instructions:    -I discussed the assessment and treatment plan with the patient. The patient was provided an opportunity to ask questions and all were answered. The patient agreed with the plan and demonstrated an understanding of the instructions.   The patient was advised to call back or seek an in-person evaluation if the symptoms worsen or if the condition fails to improve as anticipated.   Dudley Major, DO

## 2019-02-24 ENCOUNTER — Encounter: Payer: Self-pay | Admitting: *Deleted

## 2019-02-24 ENCOUNTER — Other Ambulatory Visit: Payer: Self-pay

## 2019-02-24 ENCOUNTER — Encounter: Payer: Self-pay | Admitting: Neurology

## 2019-02-24 ENCOUNTER — Telehealth (INDEPENDENT_AMBULATORY_CARE_PROVIDER_SITE_OTHER): Payer: PPO | Admitting: Neurology

## 2019-02-24 VITALS — BP 204/100

## 2019-02-24 DIAGNOSIS — G459 Transient cerebral ischemic attack, unspecified: Secondary | ICD-10-CM | POA: Diagnosis not present

## 2019-02-24 DIAGNOSIS — E1159 Type 2 diabetes mellitus with other circulatory complications: Secondary | ICD-10-CM

## 2019-02-24 DIAGNOSIS — R030 Elevated blood-pressure reading, without diagnosis of hypertension: Secondary | ICD-10-CM

## 2019-02-24 NOTE — Progress Notes (Signed)
Virtual Visit via Video Note The purpose of this virtual visit is to provide medical care while limiting exposure to the novel coronavirus.    Consent was obtained for video visit:  Yes Answered questions that patient had about telehealth interaction:  Yes I discussed the limitations, risks, security and privacy concerns of performing an evaluation and management service by telemedicine. I also discussed with the patient that there may be a patient responsible charge related to this service. The patient expressed understanding and agreed to proceed.  Pt location: Home Physician Location: office Name of referring provider:  Lavone Orn, MD I connected with Marquita Palms at patients initiation/request on 02/24/2019 at 11:10 AM EDT by video enabled telemedicine application and verified that I am speaking with the correct person using two identifiers. Pt MRN:  [id number] Pt DOB:  [birth year] Video Participants:  Marquita Palms   History of Present Illness:  Brian Fitzpatrick is an 85 year old Caucasian man who presents for TIA.  History supplemented by PCP note.  In late February, he had an episode of speech disturbance.  He was able to get words out but the words were jumbled.  He was able to understand others.  There was no associated slurred speech, facial droop, dizziness or unilateral numbness or weakness.  Episode lasted about 7-8 minutes.  Later that evening, he started having a dull daily headache that was treated well with Tylenol.  He was started on ASA 81mg  daily and atorvastatin 10mg  daily by his PCP.  He had a 2D echocardiogram on 01/13/19 which demonstrated a normal LV EF 60-65% with no cardiac source of embolus.  Carotid doppler from 01/16/19 showed mild bilateral carotid atherosclerosis with less than 50% stenosis bilaterally as well as antegrade flow of both vertebral arteries.  MRI of brain with and without contrast from 01/20/19 was personally reviewed and demonstrated moderate  chronic small vessel ischemic changes but no acute intracranial abnormality.  MRA of head showed intracranial atherosclerotic arterial disease with moderate bilateral proximal M2 stenoses and severe bilateral P1 and P2 stenoses.  Then a little over a week ago, he had a second event.  He had the same language disturbance but also had visual disturbance as well.  He was reading a sign when his vision was obscured by dark spots in his vision.  After discussion with me, Dr. Laurann [place] started him on Plavix 75mg  daily in addition to the ASA.  He also is taking atorvastatin 10mg  daily.  He has had no recurrent events.  Headaches have since resolved.  He is a former smoker.  He has type 2 diabetes.  His sister had passed away from strokes.  He denies history of migraines.  Past Medical History:     Past Medical History:  Diagnosis Date  . Arthritis   . BPH (benign prostatic hyperplasia)   . Diabetes mellitus    Borderline    DX 2009  . Vertigo    left ear sets it off/ has "crystals" in it    Medications: Outpatient Encounter Medications as of 02/24/2019  Medication Sig Note  . atorvastatin (LIPITOR) 10 MG tablet TK 1 T PO QD   . b complex vitamins tablet Take 1 tablet by mouth daily.   . Cholecalciferol (VITAMIN D) 2000 units CAPS Take 2,000-4,000 Units by mouth See admin instructions. Take 4000 units in the morning and 2000 units in the evening   . Chromium 1 MG CAPS Take 1 capsule by mouth daily.   Marland Kitchen  clopidogrel (PLAVIX) 75 MG tablet TK 1 T PO QD   . Coenzyme Q10 (CO Q 10) 100 MG CAPS Take 100 mg by mouth 2 (two) times daily.   . Cyanocobalamin (B-12) 2500 MCG TABS Take 2,500 mcg by mouth 2 (two) times daily.   Marland Kitchen DHEA 50 MG TABS Take 50 mg by mouth daily.   Marland Kitchen GLUCOSAMINE-MSM-HYALURONIC ACD PO Take 1 tablet by mouth 2 (two) times daily.   Marland Kitchen HYDROcodone-acetaminophen (NORCO/VICODIN) 5-325 MG tablet Take 1 tablet by mouth every 4 (four) hours as needed for moderate pain or  severe pain ((score 7 to 10)).   Marland Kitchen metFORMIN (GLUCOPHAGE) 500 MG tablet Take 500 mg by mouth daily.    . methocarbamol (ROBAXIN) 500 MG tablet Take 1 tablet (500 mg total) by mouth every 6 (six) hours as needed for muscle spasms.   . milk thistle 175 MG tablet Take 175 mg by mouth 2 (two) times daily.   . Multiple Vitamins-Minerals (OCUVITE-LUTEIN PO) Take 1 tablet by mouth daily.   Marland Kitchen neomycin-polymyxin-hydrocortisone (CORTISPORIN) OTIC solution Apply 1-2 drops to toe after soaking twice a day   . Omega-3 1000 MG CAPS Take 1,000 mg by mouth 2 (two) times daily. 06/25/2018: On hold for procedure   . OVER THE COUNTER MEDICATION Take 1 tablet by mouth 2 (two) times daily. Prostate health supplement   . OVER THE COUNTER MEDICATION Take 560 tablets by mouth 2 (two) times daily. Hawthorne Berry supplement    . Resveratrol 250 MG CAPS Take 250 mg by mouth daily.   . Selenium 100 MCG CAPS Take 100 mcg by mouth daily.   Marland Kitchen Specialty Vitamins Products (BRAIN) TABS Take 1 tablet by mouth 2 (two) times daily.   . Taurine 1000 MG CAPS Take 1,000 mg by mouth daily.   . Turmeric Curcumin 500 MG CAPS Take 500-1,000 mg by mouth See admin instructions. Take 1000 mg in the morning and 500 in the evening   . vitamin C (ASCORBIC ACID) 500 MG tablet Take 1,000 mg by mouth 2 (two) times daily.    . [DISCONTINUED] Krill Oil 500 MG CAPS Take 500 mg by mouth daily. 06/25/2018: Takes both krill and omega 3 On hold for procedure   No facility-administered encounter medications on file as of 02/24/2019.     Allergies: No Known Allergies  Family History:      Family History  Problem Relation Age of Onset  . Colon cancer Sister   . Colon cancer Brother   . Colon cancer Other     Social History: Social History        Socioeconomic History  . Marital status: Married    Spouse name: [facility]  . Number of children: 3  . Years of education: Not on file  . Highest education level: Not  on file  Occupational History    Comment: Retired  Scientific laboratory technician  . Financial resource strain: Not on file  . Food insecurity:    Worry: Not on file    Inability: Not on file  . Transportation needs:    Medical: Not on file    Non-medical: Not on file  Tobacco Use  . Smoking status: Former Smoker    Types: Cigarettes  . Smokeless tobacco: Never Used  . Tobacco comment: 45 YRS AGO.  Substance and Sexual Activity  . Alcohol use: Yes    Alcohol/week: 3.0 standard drinks    Types: 3 Glasses of wine per week    Comment: Occasional  .  Drug use: No  . Sexual activity: Not on file  Lifestyle  . Physical activity:    Days per week: Not on file    Minutes per session: Not on file  . Stress: Not on file  Relationships  . Social connections:    Talks on phone: Not on file    Gets together: Not on file    Attends religious service: Not on file    Active member of club or organization: Not on file    Attends meetings of clubs or organizations: Not on file    Relationship status: Not on file  . Intimate partner violence:    Fear of current or ex partner: Not on file    Emotionally abused: Not on file    Physically abused: Not on file    Forced sexual activity: Not on file  Other Topics Concern  . Not on file  Social History Narrative  . Not on file   Observations/Objective:   Blood pressure (!) 204/100. alert and oriented to person, place, and time. Attention span and concentration intact, recent and remote memory intact, fund of knowledge intact.  Speech fluent and not dysarthric, language intact.  Eyes orthophoric and move in all directions.  Face symmetric.  No pronator drift.  Romberg negative.  Assessment and Plan:   1.  Probable recurrent transient ischemic attack.  MRI/MRA does reveal chronic small vessel and intracranial atherosclerotic disease. Other risk factors include type 2 diabetes and former smoker. 2.  Type 2 diabetes  mellitus 3.  Elevated blood pressure reading  1.  Continue dual antiplatelet therapy (ASA 81mg  and Plavix 75mg  daily) for 3 weeks, then discontinue ASA and continue Plavix alone for secondary stroke prevention. 2.  Continue atorvastatin 10mg  daily.  Check lipid panel in 2 months.  He would like it performed at Dr. Delene Ruffini office. 3.  Recheck blood pressure later.   If still elevated, contact Dr. Delene Ruffini office. 4.  Optimize glycemic control 5.  Would like to check 24 hour Holter Monitor to evaluate for atrial fibrillation 6.  Follow up in 4 months.  Follow Up Instructions:              -I discussed the assessment and treatment plan with the patient. The patient was provided an opportunity to ask questions and all were answered. The patient agreed with the plan and demonstrated an understanding of the instructions.  The patient was advised to call back or seek an in-person evaluation if the symptoms worsen or if the condition fails to improve as anticipated.   Dudley Major, DO

## 2019-03-05 DIAGNOSIS — R29898 Other symptoms and signs involving the musculoskeletal system: Secondary | ICD-10-CM | POA: Diagnosis not present

## 2019-03-05 DIAGNOSIS — Z8673 Personal history of transient ischemic attack (TIA), and cerebral infarction without residual deficits: Secondary | ICD-10-CM | POA: Diagnosis not present

## 2019-03-05 DIAGNOSIS — E1169 Type 2 diabetes mellitus with other specified complication: Secondary | ICD-10-CM | POA: Diagnosis not present

## 2019-03-05 DIAGNOSIS — Z7984 Long term (current) use of oral hypoglycemic drugs: Secondary | ICD-10-CM | POA: Diagnosis not present

## 2019-03-05 DIAGNOSIS — R0600 Dyspnea, unspecified: Secondary | ICD-10-CM | POA: Diagnosis not present

## 2019-03-05 DIAGNOSIS — E7800 Pure hypercholesterolemia, unspecified: Secondary | ICD-10-CM | POA: Diagnosis not present

## 2019-03-06 DIAGNOSIS — E7800 Pure hypercholesterolemia, unspecified: Secondary | ICD-10-CM | POA: Diagnosis not present

## 2019-03-20 DIAGNOSIS — R0600 Dyspnea, unspecified: Secondary | ICD-10-CM | POA: Diagnosis not present

## 2019-03-20 DIAGNOSIS — R29898 Other symptoms and signs involving the musculoskeletal system: Secondary | ICD-10-CM | POA: Diagnosis not present

## 2019-03-20 DIAGNOSIS — E119 Type 2 diabetes mellitus without complications: Secondary | ICD-10-CM | POA: Diagnosis not present

## 2019-03-20 DIAGNOSIS — Z7984 Long term (current) use of oral hypoglycemic drugs: Secondary | ICD-10-CM | POA: Diagnosis not present

## 2019-04-07 ENCOUNTER — Telehealth: Payer: Self-pay | Admitting: Neurology

## 2019-04-07 ENCOUNTER — Ambulatory Visit: Payer: PPO | Admitting: Neurology

## 2019-04-07 NOTE — Telephone Encounter (Signed)
Patient is wanting to be seen sooner than his 4 Month Follow Up in August. He was seen in April. Please Advise. Thanks

## 2019-04-08 NOTE — Telephone Encounter (Signed)
Called and spoke with Pt.Marland Kitchen He is concerned he will now have a stroke while he takes a walk or goes on vacation. Dr. Laurann Montana advised him after the lipid panel results ok to d/c atorvastatin. Pt is taking Plavix now, d/c ASA.  He would like to have a phone visit to discuss the probability of his having a stroke now that he has had 2 TIA's. He has been reading on the internet and has found information that he has a 1-3 chance of having a stroke.

## 2019-04-08 NOTE — Telephone Encounter (Signed)
That would be fine 

## 2019-04-14 DIAGNOSIS — H02834 Dermatochalasis of left upper eyelid: Secondary | ICD-10-CM | POA: Diagnosis not present

## 2019-04-14 DIAGNOSIS — H02831 Dermatochalasis of right upper eyelid: Secondary | ICD-10-CM | POA: Diagnosis not present

## 2019-04-14 DIAGNOSIS — G459 Transient cerebral ischemic attack, unspecified: Secondary | ICD-10-CM | POA: Diagnosis not present

## 2019-04-14 DIAGNOSIS — H353132 Nonexudative age-related macular degeneration, bilateral, intermediate dry stage: Secondary | ICD-10-CM | POA: Diagnosis not present

## 2019-04-14 DIAGNOSIS — H2513 Age-related nuclear cataract, bilateral: Secondary | ICD-10-CM | POA: Diagnosis not present

## 2019-04-15 NOTE — Progress Notes (Signed)
Virtual Visit via Video Note The purpose of this virtual visit is to provide medical care while limiting exposure to the novel coronavirus.    Consent was obtained for video visit:  Yes.   Answered questions that patient had about telehealth interaction:  Yes.   I discussed the limitations, risks, security and privacy concerns of performing an evaluation and management service by telemedicine. I also discussed with the patient that there may be a patient responsible charge related to this service. The patient expressed understanding and agreed to proceed.  Pt location: Home Physician Location: office Name of referring provider:  Lavone Orn, MD I connected with Brian Fitzpatrick at patients initiation/request on 04/16/2019 at  1:30 PM EDT by video enabled telemedicine application and verified that I am speaking with the correct person using two identifiers. Pt MRN:  [id number] Pt DOB:  1934-02-21 Video Participants:  Brian Fitzpatrick   History of Present Illness:  Brian Fitzpatrick is an 85 year old Caucasian man who follows up for TIA.  UPDATE: Last visit, I ordered a 24 hour Holter monitor.  He has not been contacted about scheduling it. He is now only on Plavix.  Lipid panel from 03/05/19 performed at his PCP's office showed total cholesterol 136 and LDL of 73.  His PCP told him to stop the atorvastatin.    HISTORY: In late February, he had an episode of speech disturbance. He was able to get words out but the words were jumbled. He was able to understand others. There was no associated slurred speech, facial droop, dizziness or unilateral numbness or weakness. Episode lasted about 7-8 minutes. Later that evening, he started having a dull daily headache that was treated well with Tylenol.  He was started on ASA 81mg  daily and atorvastatin 10mg  daily by his PCP. He had a 2D echocardiogram on 01/13/19 which demonstrated a normal LV EF 60-65% with no cardiac source of embolus.Carotid doppler  from 01/16/19 showed mild bilateral carotid atherosclerosis with less than 50% stenosis bilaterally as well as antegrade flow of both vertebral arteries. MRI of brain with and without contrast from 01/20/19 was personally reviewed and demonstrated moderate chronic small vessel ischemic changes but no acute intracranial abnormality. MRA of head showed intracranial atherosclerotic arterial disease with moderate bilateral proximal M2 stenoses and severe bilateral P1 and P2 stenoses.  In early April, he had a second event. He had the same language disturbance but also had visual disturbance as well. He was reading a sign when his vision was obscured by dark spots in his vision. After discussion with me, Dr. Laurann Montana started him on Plavix 75mg  daily in addition to the ASA. He also is taking atorvastatin 10mg  daily. He has had no recurrent events. Headaches have since resolved.  He is a former smoker. He has type 2 diabetes. His sister had passed away from strokes. He denies history of migraines.  Past Medical History: Past Medical History:  Diagnosis Date  . Arthritis   . BPH (benign prostatic hyperplasia)   . Diabetes mellitus    Borderline    DX 2009  . Vertigo    left ear sets it off/ has "crystals" in it    Medications: Outpatient Encounter Medications as of 04/16/2019  Medication Sig Note  . aspirin EC 81 MG tablet Take 81 mg by mouth daily.   Marland Kitchen atorvastatin (LIPITOR) 10 MG tablet TK 1 T PO QD   . b complex vitamins tablet Take 1 tablet by mouth daily.   Marland Kitchen  Cholecalciferol (VITAMIN D) 2000 units CAPS Take 2,000-4,000 Units by mouth See admin instructions. Take 4000 units in the morning and 2000 units in the evening   . Chromium 1 MG CAPS Take 1 capsule by mouth daily.   . clopidogrel (PLAVIX) 75 MG tablet TK 1 T PO QD   . Coenzyme Q10 (CO Q 10) 100 MG CAPS Take 100 mg by mouth 2 (two) times daily.   . Cyanocobalamin (B-12) 2500 MCG TABS Take 2,500 mcg by mouth 2 (two) times daily.    Marland Kitchen DHEA 50 MG TABS Take 50 mg by mouth daily.   Marland Kitchen GLUCOSAMINE-MSM-HYALURONIC ACD PO Take 1 tablet by mouth 2 (two) times daily.   Marland Kitchen HYDROcodone-acetaminophen (NORCO/VICODIN) 5-325 MG tablet Take 1 tablet by mouth every 4 (four) hours as needed for moderate pain or severe pain ((score 7 to 10)).   Marland Kitchen metFORMIN (GLUCOPHAGE) 500 MG tablet Take 500 mg by mouth daily.    . methocarbamol (ROBAXIN) 500 MG tablet Take 1 tablet (500 mg total) by mouth every 6 (six) hours as needed for muscle spasms.   . milk thistle 175 MG tablet Take 175 mg by mouth 2 (two) times daily.   . Multiple Vitamins-Minerals (OCUVITE-LUTEIN PO) Take 1 tablet by mouth daily.   Marland Kitchen neomycin-polymyxin-hydrocortisone (CORTISPORIN) OTIC solution Apply 1-2 drops to toe after soaking twice a day   . Omega-3 1000 MG CAPS Take 1,000 mg by mouth 2 (two) times daily. 06/25/2018: On hold for procedure   . OVER THE COUNTER MEDICATION Take 1 tablet by mouth 2 (two) times daily. Prostate health supplement   . OVER THE COUNTER MEDICATION Take 560 tablets by mouth 2 (two) times daily. Hawthorne Berry supplement    . Resveratrol 250 MG CAPS Take 250 mg by mouth daily.   . Selenium 100 MCG CAPS Take 100 mcg by mouth daily.   Marland Kitchen Specialty Vitamins Products (BRAIN) TABS Take 1 tablet by mouth 2 (two) times daily.   . Taurine 1000 MG CAPS Take 1,000 mg by mouth daily.   . Turmeric Curcumin 500 MG CAPS Take 500-1,000 mg by mouth See admin instructions. Take 1000 mg in the morning and 500 in the evening   . vitamin C (ASCORBIC ACID) 500 MG tablet Take 1,000 mg by mouth 2 (two) times daily.     No facility-administered encounter medications on file as of 04/16/2019.     Allergies: No Known Allergies  Family History: Family History  Problem Relation Age of Onset  . Colon cancer Sister   . Colon cancer Brother   . Colon cancer Other     Social History: Social History   Socioeconomic History  . Marital status: Married    Spouse name: [facility] ANN  [facility]  . Number of children: 3  . Years of education: Not on file  . Highest education level: Not on file  Occupational History    Comment: Retired  Scientific laboratory technician  . Financial resource strain: Not on file  . Food insecurity:    Worry: Not on file    Inability: Not on file  . Transportation needs:    Medical: Not on file    Non-medical: Not on file  Tobacco Use  . Smoking status: Former Smoker    Types: Cigarettes  . Smokeless tobacco: Never Used  . Tobacco comment: 45 YRS AGO.  Substance and Sexual Activity  . Alcohol use: Yes    Alcohol/week: 3.0 standard drinks    Types: 3 Glasses of wine  per week    Comment: Occasional  . Drug use: No  . Sexual activity: Not on file  Lifestyle  . Physical activity:    Days per week: Not on file    Minutes per session: Not on file  . Stress: Not on file  Relationships  . Social connections:    Talks on phone: Not on file    Gets together: Not on file    Attends religious service: Not on file    Active member of club or organization: Not on file    Attends meetings of clubs or organizations: Not on file    Relationship status: Not on file  . Intimate partner violence:    Fear of current or ex partner: Not on file    Emotionally abused: Not on file    Physically abused: Not on file    Forced sexual activity: Not on file  Other Topics Concern  . Not on file  Social History Narrative  . Not on file    Observations/Objective:   There were no vitals filed for this visit.     Assessment and Plan:   1.  Probable recurrent transient ischemic attack.  MRI/MRA does reveal chronic small vessel and intracranial atherosclerotic disease. Other risk factors include type 2 diabetes and former smoker. 2.  Type 2 diabetes mellitus  1.  Continue Plavix 75mg  daily 2.  LDL goal is less than 70.  I probably would remain on atorvastatin since his LDL was acceptable on the atorvastatin.  Alternatively, lipid panel can be repeated in a couple of  months (off of statin) to re-evaluate. 3.  Exercise 4.  Mediterranean diet 5.  Follow up in August  Follow Up Instructions:    -I discussed the assessment and treatment plan with the patient. The patient was provided an opportunity to ask questions and all were answered. The patient agreed with the plan and demonstrated an understanding of the instructions.   The patient was advised to call back or seek an in-person evaluation if the symptoms worsen or if the condition fails to improve as anticipated.    Total Time spent in visit with the patient was:  15 minutes   Dudley Major, DO

## 2019-04-16 ENCOUNTER — Encounter: Payer: Self-pay | Admitting: Neurology

## 2019-04-16 ENCOUNTER — Telehealth (INDEPENDENT_AMBULATORY_CARE_PROVIDER_SITE_OTHER): Payer: PPO | Admitting: Neurology

## 2019-04-16 ENCOUNTER — Other Ambulatory Visit: Payer: Self-pay

## 2019-04-16 VITALS — Ht 68.0 in | Wt 180.0 lb

## 2019-04-16 DIAGNOSIS — G459 Transient cerebral ischemic attack, unspecified: Secondary | ICD-10-CM

## 2019-04-17 ENCOUNTER — Telehealth: Payer: Self-pay | Admitting: Neurology

## 2019-04-17 NOTE — Telephone Encounter (Signed)
Left message with after hour service on 04-17-19 @ 12:39    Caller states that he wants to speak to [facility] and he wants to get clarification on something and he wants to speak to the nurse. Caller states the DR wants him to get a cholesterol test completed and he wants to know if he needs to stay on his medication  Or wait until after the test is done

## 2019-04-17 NOTE — Telephone Encounter (Signed)
Instructed to stay off of his statin and that we will recheck his cholesterol in August. He will watch diet and exercise until then

## 2019-05-01 ENCOUNTER — Telehealth: Payer: Self-pay | Admitting: *Deleted

## 2019-05-01 NOTE — Telephone Encounter (Signed)
Calling to arrange for heart monitor per Dr. Tomi Likens.  Please call [phone].

## 2019-05-19 ENCOUNTER — Telehealth: Payer: Self-pay | Admitting: Neurology

## 2019-05-19 NOTE — Telephone Encounter (Signed)
New Message  Patient verbalized he is wanting to speak to nurse in regards to the holter monitor and companies name.

## 2019-05-20 NOTE — Telephone Encounter (Signed)
Patient returned your call, please call back. °

## 2019-05-20 NOTE — Telephone Encounter (Signed)
Called and advised Pt needs to call shelly at cardiology, [phone] concerning the heart monitor

## 2019-06-09 ENCOUNTER — Telehealth: Payer: Self-pay | Admitting: Neurology

## 2019-06-09 NOTE — Telephone Encounter (Signed)
Out of pocket cost per Melia at [employer] for ZIO XT monitor will be $0.  3 day ZIO XT long term holter monitor to be mailed to patients home.  Instructions included in kit. 

## 2019-06-09 NOTE — Telephone Encounter (Signed)
Pt states that Jaffe wanted him to do a24 hour holter moniter and the nurse was suppose to check with Tomi Likens about some things and call patient back but he has never seard back from our office please call

## 2019-06-09 NOTE — Telephone Encounter (Signed)
May 20, 2019 Clois Comber, CMA     4:53 PM Note   Called and advised Pt needs to call shelly at cardiology, [phone] concerning the heart monitor      Called spoke with pt was given this information

## 2019-06-20 ENCOUNTER — Ambulatory Visit (INDEPENDENT_AMBULATORY_CARE_PROVIDER_SITE_OTHER): Payer: PPO

## 2019-06-20 DIAGNOSIS — I4891 Unspecified atrial fibrillation: Secondary | ICD-10-CM | POA: Diagnosis not present

## 2019-06-20 DIAGNOSIS — G459 Transient cerebral ischemic attack, unspecified: Secondary | ICD-10-CM | POA: Diagnosis not present

## 2019-06-29 ENCOUNTER — Ambulatory Visit: Payer: PPO | Admitting: Neurology

## 2019-06-30 DIAGNOSIS — M19049 Primary osteoarthritis, unspecified hand: Secondary | ICD-10-CM | POA: Diagnosis not present

## 2019-06-30 DIAGNOSIS — M79641 Pain in right hand: Secondary | ICD-10-CM | POA: Diagnosis not present

## 2019-07-01 ENCOUNTER — Other Ambulatory Visit: Payer: Self-pay | Admitting: Neurology

## 2019-07-01 DIAGNOSIS — I4891 Unspecified atrial fibrillation: Secondary | ICD-10-CM | POA: Diagnosis not present

## 2019-07-01 DIAGNOSIS — G459 Transient cerebral ischemic attack, unspecified: Secondary | ICD-10-CM

## 2019-07-22 ENCOUNTER — Telehealth: Payer: Self-pay

## 2019-07-22 NOTE — Telephone Encounter (Signed)
-----   Message from Pieter Partridge, DO sent at 07/21/2019 12:05 PM EDT ----- Cardiac monitor showed no significant arrhythmias.

## 2019-07-22 NOTE — Telephone Encounter (Signed)
Patient notified of results.

## 2019-07-28 NOTE — Progress Notes (Signed)
NEUROLOGY FOLLOW UP OFFICE NOTE  ANDRI STUECK [id number]  HISTORY OF PRESENT ILLNESS: Brian Fitzpatrick is an 85 year old Caucasian man who follows up for TIA.  UPDATE: Current medications:  Plavix 75mg ; metformin  Cardiac event monitor from 06/20/19 to 06/23/19 revealed no significant arrhythmias.  Walks 3 miles daily.    HISTORY: In late February 2020, he had an episode of speech disturbance. He was able to get words out but the words were jumbled. He was able to understand others. There was no associated slurred speech, facial droop, dizziness or unilateral numbness or weakness. Episode lasted about 7-8 minutes. Later that evening, he started having a dull daily headache that was treated well with Tylenol.  He was started on ASA 81mg  daily and atorvastatin 10mg  daily by his PCP. He had a 2D echocardiogram on 01/13/19 which demonstrated a normal LV EF 60-65% with no cardiac source of embolus.Carotid doppler from 01/16/19 showed mild bilateral carotid atherosclerosis with less than 50% stenosis bilaterally as well as antegrade flow of both vertebral arteries. MRI of brain with and without contrast from 01/20/19 was personally reviewed and demonstrated moderate chronic small vessel ischemic changes but no acute intracranial abnormality. MRA of head showed intracranial atherosclerotic arterial disease with moderate bilateral proximal M2 stenoses and severe bilateral P1 and P2 stenoses.  In early April, he had a second event. He had the same language disturbance but also had visual disturbance as well. He was reading a sign when his vision was obscured by dark spots in his vision. After discussion with me, Dr. Laurann Montana started him on Plavix 75mg  daily in addition to the ASA. He also was taking atorvastatin 10mg  daily but was discontinued as he had an LDL of 73. He has had no recurrent events. Headaches have since resolved.  He is a former smoker. He has type 2 diabetes. His sister had  passed away from strokes. He denies history of migraines.  PAST MEDICAL HISTORY: Past Medical History:  Diagnosis Date  . Arthritis   . BPH (benign prostatic hyperplasia)   . Diabetes mellitus    Borderline    DX 2009  . Vertigo    left ear sets it off/ has "crystals" in it    MEDICATIONS: Current Outpatient Medications on File Prior to Visit  Medication Sig Dispense Refill  . b complex vitamins tablet Take 1 tablet by mouth daily.    . Cholecalciferol (VITAMIN D) 2000 units CAPS Take 2,000-4,000 Units by mouth See admin instructions. Take 4000 units in the morning and 2000 units in the evening    . Chromium 1 MG CAPS Take 1 capsule by mouth daily.    . clopidogrel (PLAVIX) 75 MG tablet TK 1 T PO QD    . Coenzyme Q10 (CO Q 10) 100 MG CAPS Take 100 mg by mouth 2 (two) times daily.    . Cyanocobalamin (B-12) 2500 MCG TABS Take 2,500 mcg by mouth 2 (two) times daily.    Marland Kitchen DHEA 50 MG TABS Take 50 mg by mouth daily.    Marland Kitchen GLUCOSAMINE-MSM-HYALURONIC ACD PO Take 1 tablet by mouth 2 (two) times daily.    . metFORMIN (GLUCOPHAGE) 500 MG tablet Take 500 mg by mouth daily.     . milk thistle 175 MG tablet Take 175 mg by mouth 2 (two) times daily.    . Multiple Vitamins-Minerals (OCUVITE-LUTEIN PO) Take 1 tablet by mouth daily.    Marland Kitchen OVER THE COUNTER MEDICATION Take 1 tablet by mouth 2 (  two) times daily. Prostate health supplement    . OVER THE COUNTER MEDICATION Take 560 tablets by mouth 2 (two) times daily. Hawthorne Berry supplement     . Resveratrol 250 MG CAPS Take 250 mg by mouth daily.    . Selenium 100 MCG CAPS Take 100 mcg by mouth daily.    Marland Kitchen Specialty Vitamins Products (BRAIN) TABS Take 1 tablet by mouth 2 (two) times daily.    . Taurine 1000 MG CAPS Take 1,000 mg by mouth daily.    . Turmeric Curcumin 500 MG CAPS Take 500-1,000 mg by mouth See admin instructions. Take 1000 mg in the morning and 500 in the evening    . vitamin C (ASCORBIC ACID) 500 MG tablet Take 1,000 mg by mouth 2  (two) times daily.      No current facility-administered medications on file prior to visit.     ALLERGIES: No Known Allergies  FAMILY HISTORY: Family History  Problem Relation Age of Onset  . Colon cancer Sister   . Colon cancer Brother   . Colon cancer Other    SOCIAL HISTORY: Social History   Socioeconomic History  . Marital status: Married    Spouse name: [facility]  . Number of children: 3  . Years of education: Not on file  . Highest education level: Not on file  Occupational History    Comment: Retired  Scientific laboratory technician  . Financial resource strain: Not on file  . Food insecurity    Worry: Not on file    Inability: Not on file  . Transportation needs    Medical: Not on file    Non-medical: Not on file  Tobacco Use  . Smoking status: Former Smoker    Types: Cigarettes  . Smokeless tobacco: Never Used  . Tobacco comment: 45 YRS AGO.  Substance and Sexual Activity  . Alcohol use: Yes    Alcohol/week: 3.0 standard drinks    Types: 3 Glasses of wine per week    Comment: Occasional  . Drug use: No  . Sexual activity: Not on file  Lifestyle  . Physical activity    Days per week: Not on file    Minutes per session: Not on file  . Stress: Not on file  Relationships  . Social Herbalist on phone: Not on file    Gets together: Not on file    Attends religious service: Not on file    Active member of club or organization: Not on file    Attends meetings of clubs or organizations: Not on file    Relationship status: Not on file  . Intimate partner violence    Fear of current or ex partner: Not on file    Emotionally abused: Not on file    Physically abused: Not on file    Forced sexual activity: Not on file  Other Topics Concern  . Not on file  Social History Narrative   Lives with wife in single level home      College edu      Right handed    REVIEW OF SYSTEMS: Constitutional: No fevers, chills, or sweats, no generalized fatigue, change  in appetite Eyes: No visual changes, double vision, eye pain Ear, nose and throat: No hearing loss, ear pain, nasal congestion, sore throat Cardiovascular: No chest pain, palpitations Respiratory:  No shortness of breath at rest or with exertion, wheezes GastrointestinaI: No nausea, vomiting, diarrhea, abdominal pain, fecal incontinence Genitourinary:  No dysuria,  urinary retention or frequency Musculoskeletal:  No neck pain, back pain Integumentary: No rash, pruritus, skin lesions Neurological: as above Psychiatric: No depression, insomnia, anxiety Endocrine: No palpitations, fatigue, diaphoresis, mood swings, change in appetite, change in weight, increased thirst Hematologic/Lymphatic:  No purpura, petechiae. Allergic/Immunologic: no itchy/runny eyes, nasal congestion, recent allergic reactions, rashes  PHYSICAL EXAM: Blood pressure 139/78, pulse (!) 59, temperature 98.5 F (36.9 C), height 5\' 8"  (1.727 m), weight 183 lb (83 kg), SpO2 98 %. General: No acute distress.  Patient appears well-groomed.   Head:  Normocephalic/atraumatic Eyes:  Fundi examined but not visualized Neck: supple, no paraspinal tenderness, full range of motion Heart:  Regular rate and rhythm Lungs:  Clear to auscultation bilaterally Back: No paraspinal tenderness Neurological Exam: alert and oriented to person, place, and time. Attention span and concentration intact, recent and remote memory intact, fund of knowledge intact.  Speech fluent and not dysarthric, language intact.  CN II-XII intact. Bulk and tone normal, muscle strength 5/5 throughout.  Sensation to light touch, temperature and vibration intact.  Deep tendon reflexes 2+ throughout, toes downgoing.  Finger to nose and heel to shin testing intact.  Gait normal, Romberg negative.  IMPRESSION: 1.  Probable recurrent transient ischemic attack.  MRI/MRA does reveal chronic small vessel and intracranial atherosclerotic disease. Other risk factors include type  2 diabetes and former smoker. 2.  Type 2 diabetes mellitus  PLAN: 1.  Plavix 75mg  daily for secondary stroke prevention 2.  LDL goal less than 70 3.  Hgb A1c goal less than 7.0.  On metformin. 4.  Exercise 5.  Follow up in 6 months  15 minutes spent face to face with patient, over 50% spent discussing management.  Metta Clines, DO  CC: Lavone Orn, MD

## 2019-07-30 ENCOUNTER — Other Ambulatory Visit: Payer: Self-pay

## 2019-07-30 ENCOUNTER — Ambulatory Visit (INDEPENDENT_AMBULATORY_CARE_PROVIDER_SITE_OTHER): Payer: PPO | Admitting: Neurology

## 2019-07-30 ENCOUNTER — Encounter: Payer: Self-pay | Admitting: Neurology

## 2019-07-30 VITALS — BP 139/78 | HR 59 | Temp 98.5°F | Ht 68.0 in | Wt 183.0 lb

## 2019-07-30 DIAGNOSIS — E1159 Type 2 diabetes mellitus with other circulatory complications: Secondary | ICD-10-CM

## 2019-07-30 DIAGNOSIS — G459 Transient cerebral ischemic attack, unspecified: Secondary | ICD-10-CM

## 2019-07-30 NOTE — Patient Instructions (Signed)
1.  Continue Plavix 75mg  daily 2.  Continue exercising 3.  Follow up in 6 months.

## 2019-08-06 DIAGNOSIS — M79641 Pain in right hand: Secondary | ICD-10-CM | POA: Diagnosis not present

## 2019-08-06 DIAGNOSIS — M19049 Primary osteoarthritis, unspecified hand: Secondary | ICD-10-CM | POA: Diagnosis not present

## 2019-08-06 DIAGNOSIS — G5621 Lesion of ulnar nerve, right upper limb: Secondary | ICD-10-CM | POA: Diagnosis not present

## 2019-08-20 DIAGNOSIS — L7212 Trichodermal cyst: Secondary | ICD-10-CM | POA: Diagnosis not present

## 2019-08-20 DIAGNOSIS — D485 Neoplasm of uncertain behavior of skin: Secondary | ICD-10-CM | POA: Diagnosis not present

## 2019-09-15 DIAGNOSIS — E1169 Type 2 diabetes mellitus with other specified complication: Secondary | ICD-10-CM | POA: Diagnosis not present

## 2019-09-15 DIAGNOSIS — Z1389 Encounter for screening for other disorder: Secondary | ICD-10-CM | POA: Diagnosis not present

## 2019-09-15 DIAGNOSIS — L989 Disorder of the skin and subcutaneous tissue, unspecified: Secondary | ICD-10-CM | POA: Diagnosis not present

## 2019-09-15 DIAGNOSIS — Z129 Encounter for screening for malignant neoplasm, site unspecified: Secondary | ICD-10-CM | POA: Diagnosis not present

## 2019-09-15 DIAGNOSIS — Z23 Encounter for immunization: Secondary | ICD-10-CM | POA: Diagnosis not present

## 2019-09-15 DIAGNOSIS — E0869 Diabetes mellitus due to underlying condition with other specified complication: Secondary | ICD-10-CM | POA: Diagnosis not present

## 2019-09-15 DIAGNOSIS — E7800 Pure hypercholesterolemia, unspecified: Secondary | ICD-10-CM | POA: Diagnosis not present

## 2019-09-15 DIAGNOSIS — Z8673 Personal history of transient ischemic attack (TIA), and cerebral infarction without residual deficits: Secondary | ICD-10-CM | POA: Diagnosis not present

## 2019-09-15 DIAGNOSIS — Z0000 Encounter for general adult medical examination without abnormal findings: Secondary | ICD-10-CM | POA: Diagnosis not present

## 2019-10-09 IMAGING — MR MRI HEAD WITHOUT AND WITH CONTRAST
12 series · 48 of 48 positions shown · IV contrast (multihance)
Comparison: 11/15/2011

CLINICAL DATA: TIA.  Transient speech disturbance a few weeks ago.

EXAM:
MRI HEAD WITHOUT AND WITH CONTRAST
MRA HEAD WITHOUT CONTRAST
TECHNIQUE: Multiplanar, multiecho pulse sequences of the brain and surrounding
structures were obtained without and with intravenous contrast.
Angiographic images of the head were obtained using MRA technique
without contrast.
CONTRAST:  17mL MULTIHANCE GADOBENATE DIMEGLUMINE 529 MG/ML IV SOLN

[Series 2: t1_se_sag · sagittal · 5.0mm · 0.45mm/px · 1 of 21 slices shown]
[im 1/21]
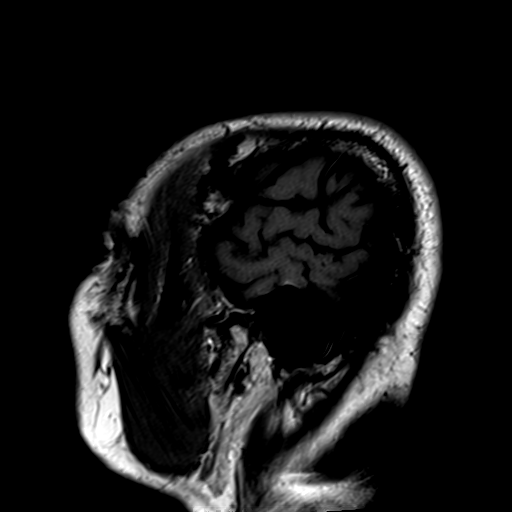

[Series 3: ep2d_diff_(id)_trace · axial · 3.0mm · 1.80mm/px · z∈[-35,+99]mm · 6 of 94 slices shown]
[im 1/94]
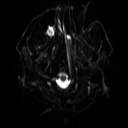
[im 19/94]
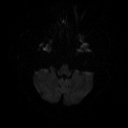
[im 38/94]
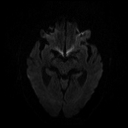
[im 56/94]
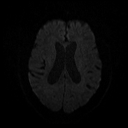
[im 75/94]
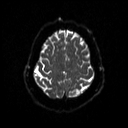
[im 94/94]
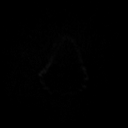

[Series 4: ep2d_diff_(id)_trace_adc · axial · 3.0mm · 1.80mm/px · z∈[-35,+99]mm · 3 of 48 slices shown]
[im 1/48]
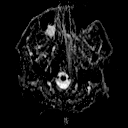
[im 24/48]
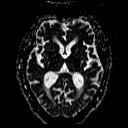
[im 48/48]
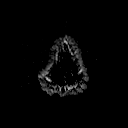

[Series 5: ep2d_diff_cor · coronal · 5.0mm · 1.77mm/px · 3 of 52 slices shown]
[im 1/52]
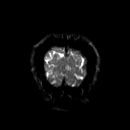
[im 26/52]
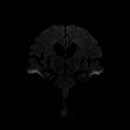
[im 52/52]
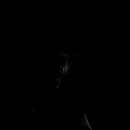

[Series 6: ep2d_diff_cor_adc · coronal · 5.0mm · 1.77mm/px · 2 of 26 slices shown]
[im 1/26]
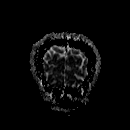
[im 26/26]
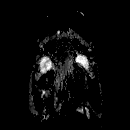

[Series 7: FLAIR · axial · 3.0mm · 0.45mm/px · z∈[-42,+106]mm · 2 of 27 slices shown]
[im 1/27]
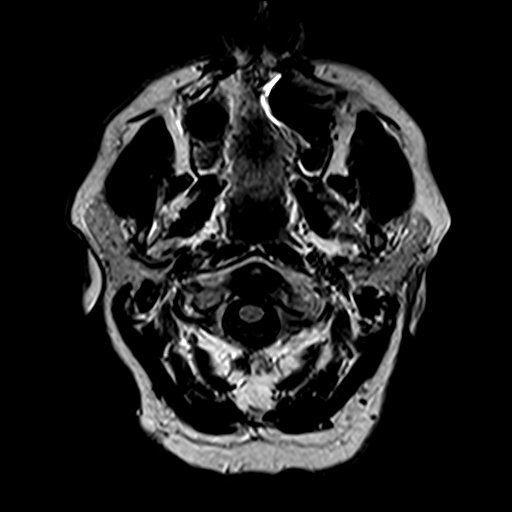
[im 27/27]
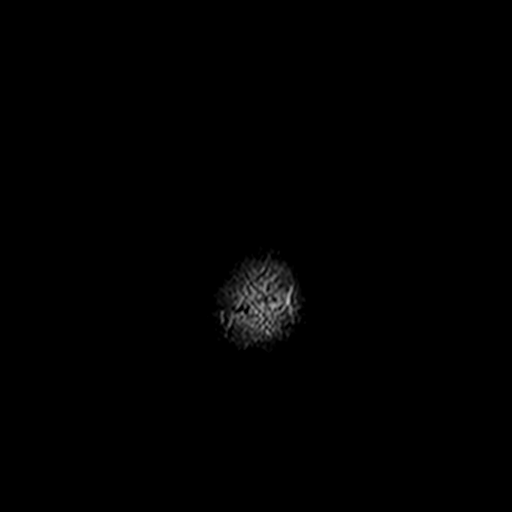

[Series 8: t2_tse_tra · axial · 5.0mm · 0.60mm/px · z∈[-39,+103]mm · 2 of 26 slices shown]
[im 1/26]
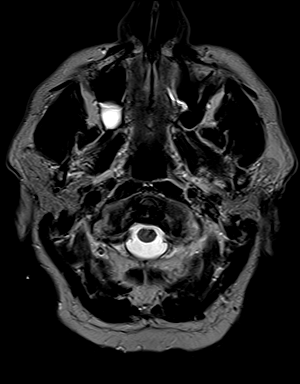
[im 26/26]
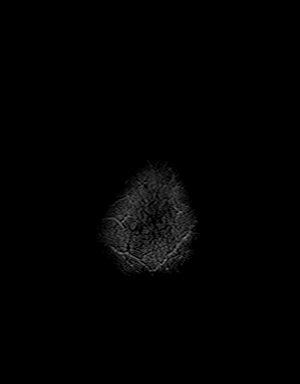

[Series 10: swi_images · axial · 2.0mm · 0.90mm/px · z∈[-43,+107]mm · 5 of 80 slices shown]
[im 1/80]
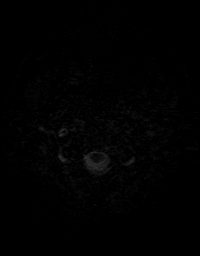
[im 20/80]
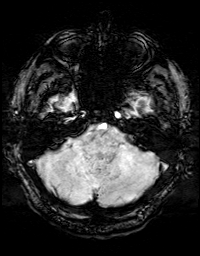
[im 40/80]
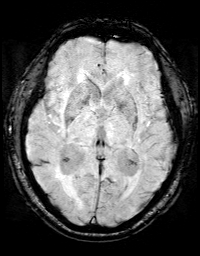
[im 60/80]
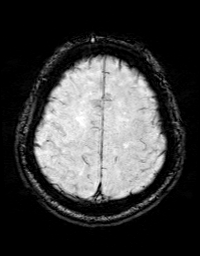
[im 80/80]
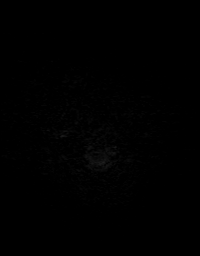

[Series 11: t1_mpr_tra · axial · 1.0mm · 0.72mm/px · z∈[-36,+100]mm · 10 of 144 slices shown]
[im 1/144]
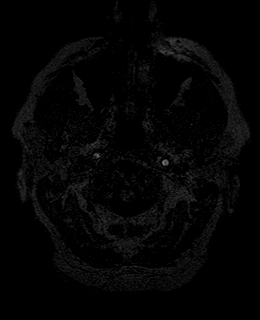
[im 16/144]
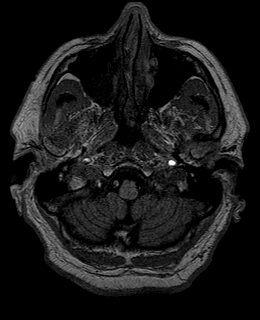
[im 32/144]
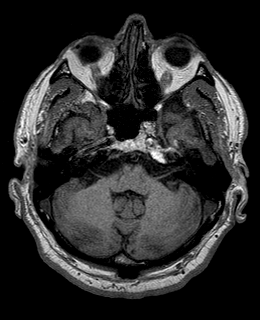
[im 48/144]
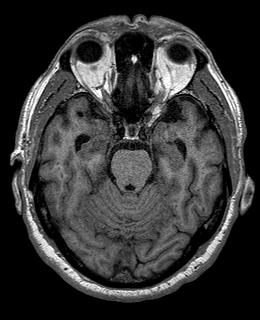
[im 64/144]
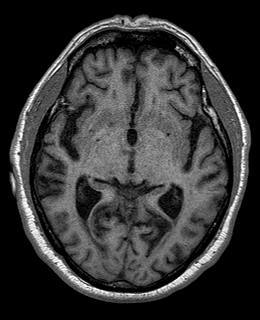
[im 80/144]
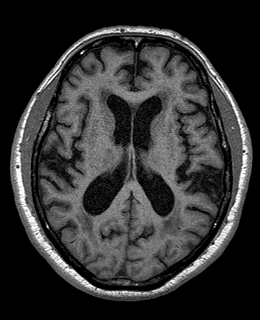
[im 96/144]
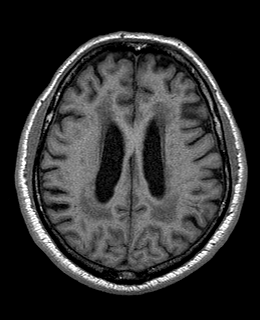
[im 112/144]
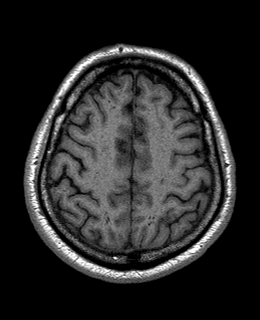
[im 128/144]
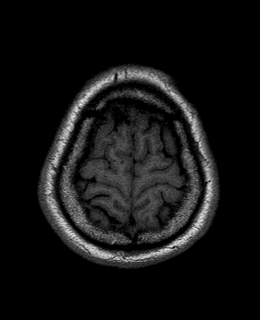
[im 144/144]
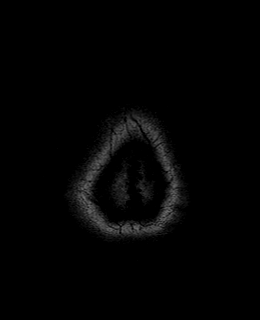

[Series 12: T2 post-contrast · coronal · 5.0mm · 0.45mm/px · 2 of 30 slices shown]
[im 1/30]
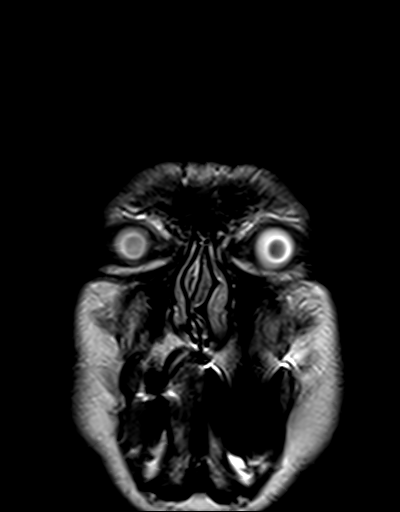
[im 30/30]
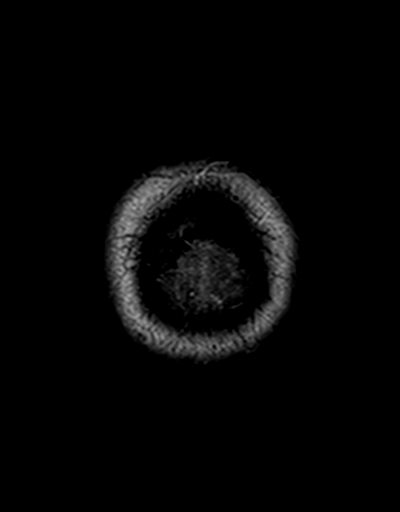

[Series 13: post t1_mpr_tra · axial · 1.0mm · 0.72mm/px · z∈[-36,+100]mm · 10 of 144 slices shown]
[im 1/144]
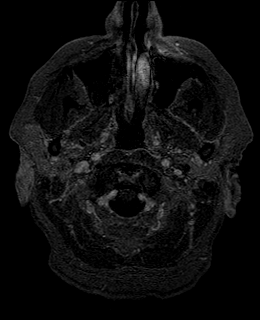
[im 16/144]
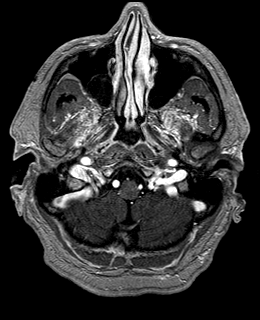
[im 32/144]
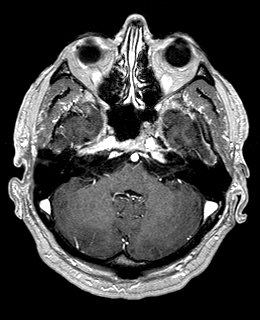
[im 48/144]
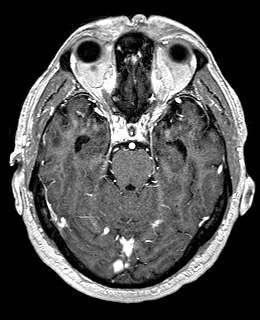
[im 64/144]
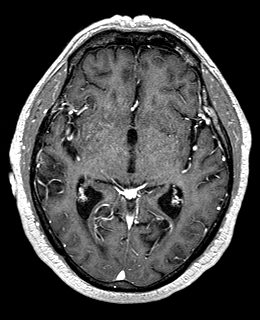
[im 80/144]
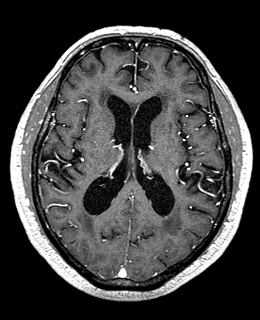
[im 96/144]
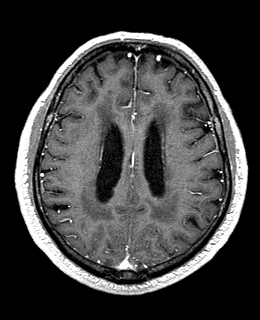
[im 112/144]
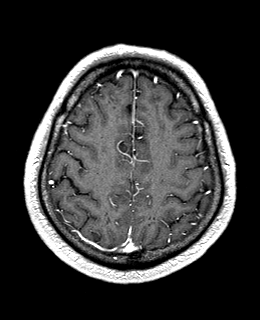
[im 128/144]
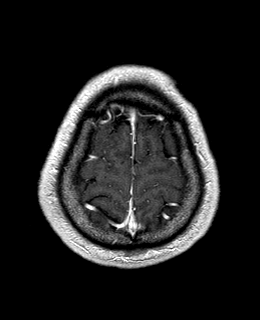
[im 144/144]
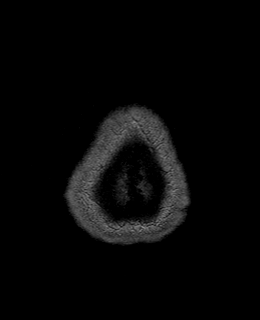

[Series 14: T1 post-contrast · coronal · 5.0mm · 0.72mm/px · 2 of 30 slices shown]
[im 1/30]
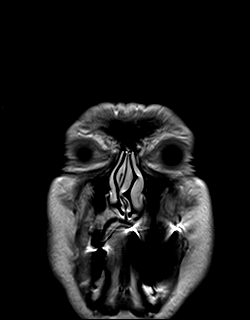
[im 30/30]
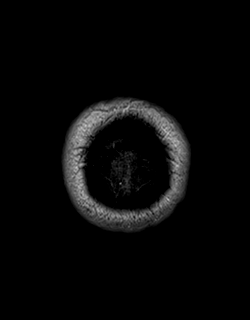

[48 of 48 positions shown; findings below may reference images not displayed]

FINDINGS: MRI HEAD FINDINGS

Brain: There is no evidence of acute infarct, intracranial
hemorrhage, mass, midline shift, or extra-axial fluid collection.
Patchy to confluent T2 hyperintensities in the cerebral white matter
bilaterally have progressed from the prior study and are nonspecific
but compatible with moderate chronic small vessel ischemic disease.
There is moderate cerebral atrophy. No abnormal enhancement is
identified. Dilated perivascular spaces are noted in the basal
ganglia and cerebral white matter bilaterally.

Vascular: Major intracranial vascular flow voids are preserved.

Skull and upper cervical spine: Unremarkable bone marrow signal.

Sinuses/Orbits: Unremarkable orbits. Right maxillary sinus mucous
retention cyst. Clear mastoid air cells.

Other: None.

MRA HEAD FINDINGS

The visualized distal vertebral arteries are widely patent to the
basilar with the right being moderately dominant. Patent bilateral
PICA, left AICA, and bilateral SCA origins are identified. The
basilar artery is widely patent. The PCAs are patent with new severe
P1 and P2 stenoses bilaterally.

The internal carotid arteries are widely patent from skull base to
carotid termini. ACAs and MCAs are patent without evidence of
significant A1 or right M1 stenosis. There is a new mild distal left
M1 stenosis, and there are moderate bilateral proximal M2 stenoses.
No aneurysm is identified.
IMPRESSION: 1. No acute intracranial abnormality.
2. Moderate chronic small vessel ischemic disease, progressed from
3. Progressive intracranial atherosclerosis including severe
bilateral PCA stenoses.

## 2019-10-19 ENCOUNTER — Telehealth: Payer: Self-pay | Admitting: Neurology

## 2019-10-19 NOTE — Telephone Encounter (Signed)
Patient returned a call to [place].

## 2019-10-19 NOTE — Telephone Encounter (Signed)
He should make an urgent appointment with his ophthalmologist

## 2019-10-19 NOTE — Telephone Encounter (Signed)
Called patient no answer left message to call office back to discuss his symptoms.

## 2019-10-19 NOTE — Telephone Encounter (Signed)
Called spoke with patient he was informed to contact ophthalmologist

## 2019-10-19 NOTE — Telephone Encounter (Signed)
The following message was left with [employer] on 10/19/19 at 12:35 PM.  Caller states he is losing eyesight in his right eye. He'd like a call back from the nurse for Dr. Tomi Likens.

## 2019-10-19 NOTE — Telephone Encounter (Signed)
Called spoke with patient he states that right side of right eye unable to see from the right side of eye on going for while. Denies dizziness, blurred visions, or complete vision loss Some balance issues.   Pt seen Ophthalmology  about 3-4 months ago

## 2019-10-21 ENCOUNTER — Encounter: Payer: Self-pay | Admitting: Anesthesiology

## 2019-10-21 DIAGNOSIS — R519 Headache, unspecified: Secondary | ICD-10-CM | POA: Diagnosis not present

## 2019-10-21 DIAGNOSIS — H02831 Dermatochalasis of right upper eyelid: Secondary | ICD-10-CM | POA: Diagnosis not present

## 2019-10-21 DIAGNOSIS — G459 Transient cerebral ischemic attack, unspecified: Secondary | ICD-10-CM | POA: Diagnosis not present

## 2019-10-21 DIAGNOSIS — H353132 Nonexudative age-related macular degeneration, bilateral, intermediate dry stage: Secondary | ICD-10-CM | POA: Diagnosis not present

## 2019-10-21 DIAGNOSIS — H2513 Age-related nuclear cataract, bilateral: Secondary | ICD-10-CM | POA: Diagnosis not present

## 2019-10-21 DIAGNOSIS — H02834 Dermatochalasis of left upper eyelid: Secondary | ICD-10-CM | POA: Diagnosis not present

## 2019-10-21 DIAGNOSIS — H53129 Transient visual loss, unspecified eye: Secondary | ICD-10-CM | POA: Diagnosis not present

## 2019-10-21 LAB — HM DIABETES EYE EXAM

## 2019-10-23 ENCOUNTER — Other Ambulatory Visit (HOSPITAL_COMMUNITY): Payer: Self-pay | Admitting: Physician Assistant

## 2019-10-23 DIAGNOSIS — R52 Pain, unspecified: Secondary | ICD-10-CM

## 2019-10-23 DIAGNOSIS — M79662 Pain in left lower leg: Secondary | ICD-10-CM | POA: Diagnosis not present

## 2019-10-26 ENCOUNTER — Ambulatory Visit (HOSPITAL_COMMUNITY)
Admission: RE | Admit: 2019-10-26 | Discharge: 2019-10-26 | Disposition: A | Discharge status: Home / Self Care | Payer: PPO | Source: Ambulatory Visit | Attending: Internal Medicine | Admitting: Internal Medicine

## 2019-10-26 ENCOUNTER — Other Ambulatory Visit: Payer: Self-pay

## 2019-10-26 DIAGNOSIS — M79606 Pain in leg, unspecified: Secondary | ICD-10-CM | POA: Diagnosis not present

## 2019-10-26 DIAGNOSIS — R52 Pain, unspecified: Secondary | ICD-10-CM | POA: Diagnosis not present

## 2019-10-26 DIAGNOSIS — Z7902 Long term (current) use of antithrombotics/antiplatelets: Secondary | ICD-10-CM | POA: Insufficient documentation

## 2019-10-26 NOTE — Progress Notes (Signed)
Left lower extremity venous duplex has been completed. Preliminary results can be found in CV Proc through chart review.  Results were given to [facility].  10/26/19 10:26 AM Carlos Levering RVT

## 2019-10-27 DIAGNOSIS — L723 Sebaceous cyst: Secondary | ICD-10-CM | POA: Diagnosis not present

## 2019-10-27 DIAGNOSIS — D485 Neoplasm of uncertain behavior of skin: Secondary | ICD-10-CM | POA: Diagnosis not present

## 2019-10-27 DIAGNOSIS — L570 Actinic keratosis: Secondary | ICD-10-CM | POA: Diagnosis not present

## 2019-10-29 ENCOUNTER — Encounter: Payer: Self-pay | Admitting: Neurology

## 2019-10-29 NOTE — Progress Notes (Signed)
Virtual Visit via Video Note The purpose of this virtual visit is to provide medical care while limiting exposure to the novel coronavirus.    Consent was obtained for video visit:  Yes.   Answered questions that patient had about telehealth interaction:  Yes.   I discussed the limitations, risks, security and privacy concerns of performing an evaluation and management service by telemedicine. I also discussed with the patient that there may be a patient responsible charge related to this service. The patient expressed understanding and agreed to proceed.  Pt location: Home Physician Location: office Name of referring provider:  Lavone Orn, MD I connected with Brian Fitzpatrick at patients initiation/request on 10/30/2019 at 11:40 AM EST by video enabled telemedicine application and verified that I am speaking with the correct person using two identifiers. Pt MRN:  [id number] Pt DOB:  09-23-1934 Video Participants:  Brian Fitzpatrick   History of Present Illness:  Brian Fitzpatrick is an 85 year old Caucasian man who follows up for TIA.  UPDATE: Current medications:  Plavix '75mg'$ ; metformin; atorvastatin '10mg'$   .Received a note from his ophthalmologist, Dr. Katy Fitch, in which he reportedly has endorsed experiencing recurrent visual disturbance since his last TIA in April.  He reports vision loss on the right side of his visual field [place].  It would last an hour and resolve.  It has been occurring daily.    This month, Dr. Katy Fitch checked some labs.  ESR was 11 and CRP 1.0.  HISTORY: In late February 2020, he had an episode of speech disturbance. He was able to get words out but the words were jumbled. He was able to understand others. There was no associated slurred speech, facial droop, dizziness or unilateral numbness or weakness. Episode lasted about 7-8 minutes. Later that evening, he started having a dull daily headache that was treated well with Tylenol.  He was  started on ASA '81mg'$  daily and atorvastatin '10mg'$  daily by his PCP. He had a 2D echocardiogram on 01/13/19 which demonstrated a normal LV EF 60-65% with no cardiac source of embolus.Carotid doppler from 01/16/19 showed mild bilateral carotid atherosclerosis with less than 50% stenosis bilaterally as well as antegrade flow of both vertebral arteries. MRI of brain with and without contrast from 01/20/19 was personally reviewed and demonstrated moderate chronic small vessel ischemic changes but no acute intracranial abnormality. MRA of head showed intracranial atherosclerotic arterial disease with moderate bilateral proximal M2 stenoses and severe bilateral P1 and P2 stenoses.  In early April,he had a second event. He had the same language disturbance but also had visual disturbance as well. He was reading a sign when his vision was obscured by dark spots in his vision. After discussion with me, Dr. Laurann Montana started him on Plavix '75mg'$  daily in addition to the ASA. He also was taking atorvastatin '10mg'$  daily but was discontinued as he had an LDL of 73. He has had no recurrent events. Headaches have since resolved.  Cardiac event monitor from 06/20/19 to 06/23/19 revealed no significant arrhythmias  He is a former smoker. He has type 2 diabetes. His sister had passed away from strokes. He denies history of migraines.  Past Medical History: Past Medical History:  Diagnosis Date  . Arthritis   . BPH (benign prostatic hyperplasia)   . Diabetes mellitus    Borderline    DX 2009  . Vertigo    left ear sets it off/ has "crystals" in it    Medications:  Outpatient Encounter Medications as of 10/30/2019  Medication Sig  . b complex vitamins tablet Take 1 tablet by mouth daily.  . Cholecalciferol (VITAMIN D) 2000 units CAPS Take 2,000-4,000 Units by mouth See admin instructions. Take 4000 units in the morning and 2000 units in the evening  . Chromium 1 MG CAPS Take 1 capsule by mouth daily.  .  clopidogrel (PLAVIX) 75 MG tablet TK 1 T PO QD  . Coenzyme Q10 (CO Q 10) 100 MG CAPS Take 100 mg by mouth 2 (two) times daily.  . Cyanocobalamin (B-12) 2500 MCG TABS Take 2,500 mcg by mouth 2 (two) times daily.  Marland Kitchen DHEA 50 MG TABS Take 50 mg by mouth daily.  Marland Kitchen GLUCOSAMINE-MSM-HYALURONIC ACD PO Take 1 tablet by mouth 2 (two) times daily.  . metFORMIN (GLUCOPHAGE) 500 MG tablet Take 500 mg by mouth daily.   . milk thistle 175 MG tablet Take 175 mg by mouth 2 (two) times daily.  . Multiple Vitamins-Minerals (OCUVITE-LUTEIN PO) Take 1 tablet by mouth daily.  Marland Kitchen OVER THE COUNTER MEDICATION Take 1 tablet by mouth 2 (two) times daily. Prostate health supplement  . OVER THE COUNTER MEDICATION Take 560 tablets by mouth 2 (two) times daily. Hawthorne Berry supplement   . Resveratrol 250 MG CAPS Take 250 mg by mouth daily.  . Selenium 100 MCG CAPS Take 100 mcg by mouth daily.  Marland Kitchen Specialty Vitamins Products (BRAIN) TABS Take 1 tablet by mouth 2 (two) times daily.  . Taurine 1000 MG CAPS Take 1,000 mg by mouth daily.  . Turmeric Curcumin 500 MG CAPS Take 500-1,000 mg by mouth See admin instructions. Take 1000 mg in the morning and 500 in the evening  . vitamin C (ASCORBIC ACID) 500 MG tablet Take 1,000 mg by mouth 2 (two) times daily.    No facility-administered encounter medications on file as of 10/30/2019.    Allergies: No Known Allergies  Family History: Family History  Problem Relation Age of Onset  . Colon cancer Sister   . Colon cancer Brother   . Colon cancer Other     Social History: Social History   Socioeconomic History  . Marital status: Married    Spouse name: [facility]  . Number of children: 3  . Years of education: Not on file  . Highest education level: Not on file  Occupational History    Comment: Retired  Tobacco Use  . Smoking status: Former Smoker    Types: Cigarettes  . Smokeless tobacco: Never Used  . Tobacco comment: 45 YRS AGO.  Substance and Sexual  Activity  . Alcohol use: Yes    Alcohol/week: 3.0 standard drinks    Types: 3 Glasses of wine per week    Comment: Occasional  . Drug use: No  . Sexual activity: Not on file  Other Topics Concern  . Not on file  Social History Narrative   Lives with wife in single level home      College edu      Right handed      Caffeine none      Exercise yes walk 3 miles /day    Social Determinants of Health   Financial Resource Strain:   . Difficulty of Paying Living Expenses: Not on file  Food Insecurity:   . Worried About Charity fundraiser in the Last Year: Not on file  . Ran Out of Food in the Last Year: Not on file  Transportation Needs:   . Lack  of Transportation (Medical): Not on file  . Lack of Transportation (Non-Medical): Not on file  Physical Activity:   . Days of Exercise per Week: Not on file  . Minutes of Exercise per Session: Not on file  Stress:   . Feeling of Stress : Not on file  Social Connections:   . Frequency of Communication with Friends and Family: Not on file  . Frequency of Social Gatherings with Friends and Family: Not on file  . Attends Religious Services: Not on file  . Active Member of Clubs or Organizations: Not on file  . Attends Archivist Meetings: Not on file  . Marital Status: Not on file  Intimate Partner Violence:   . Fear of Current or Ex-Partner: Not on file  . Emotionally Abused: Not on file  . Physically Abused: Not on file  . Sexually Abused: Not on file    Observations/Objective:   Height '5\' 8"'$  (1.727 m), weight 180 lb (81.6 kg). No acute distress.  Alert and oriented.  Speech fluent and not dysarthric.  Language intact.  Eyes orthophoric on primary gaze.  Face symmetric.  Assessment and Plan:   1.  History of recurrent transient ischemic attacks.  Currently has been experiencing recurrent daily monocular vision loss in the right eye.Marland Kitchen  He has intracranial atherosclerotic disease, including bilateral severe P1 and P2  stenoses, however an ischemic event involving the brain would involve binocular vision loss.  Consider amaurosis fugax but as this has been ongoing daily for several months, I would have suspected that he would have had some permanent vision loss.  Temporal arteritis not suspected with normal sed rate.  2.  Type 2 diabetes mellitus  1.  Check CTA of head and neck to evaluate for any new or worsening intracranial and extracranial arterial stenosis. 2.  Will repeat MRI of brain to evaluate for any new ischemic changes since last imaging in March. 3.  He already has follow up with me in March.  Follow Up Instructions:    -I discussed the assessment and treatment plan with the patient. The patient was provided an opportunity to ask questions and all were answered. The patient agreed with the plan and demonstrated an understanding of the instructions.   The patient was advised to call back or seek an in-person evaluation if the symptoms worsen or if the condition fails to improve as anticipated.    Total Time spent in visit with the patient was:  15 minutes   Dudley Major, DO

## 2019-10-30 ENCOUNTER — Other Ambulatory Visit: Payer: Self-pay

## 2019-10-30 ENCOUNTER — Telehealth (INDEPENDENT_AMBULATORY_CARE_PROVIDER_SITE_OTHER): Payer: PPO | Admitting: Neurology

## 2019-10-30 VITALS — Ht 68.0 in | Wt 180.0 lb

## 2019-10-30 DIAGNOSIS — E119 Type 2 diabetes mellitus without complications: Secondary | ICD-10-CM

## 2019-10-30 DIAGNOSIS — Z7902 Long term (current) use of antithrombotics/antiplatelets: Secondary | ICD-10-CM | POA: Diagnosis not present

## 2019-10-30 DIAGNOSIS — Z8673 Personal history of transient ischemic attack (TIA), and cerebral infarction without residual deficits: Secondary | ICD-10-CM | POA: Diagnosis not present

## 2019-10-30 DIAGNOSIS — H5461 Unqualified visual loss, right eye, normal vision left eye: Secondary | ICD-10-CM

## 2019-10-30 DIAGNOSIS — G459 Transient cerebral ischemic attack, unspecified: Secondary | ICD-10-CM

## 2019-10-30 DIAGNOSIS — Z7984 Long term (current) use of oral hypoglycemic drugs: Secondary | ICD-10-CM

## 2019-10-30 DIAGNOSIS — Z7982 Long term (current) use of aspirin: Secondary | ICD-10-CM

## 2019-10-30 DIAGNOSIS — G453 Amaurosis fugax: Secondary | ICD-10-CM

## 2019-11-10 ENCOUNTER — Other Ambulatory Visit: Payer: Self-pay

## 2019-11-10 ENCOUNTER — Telehealth: Payer: Self-pay | Admitting: Neurology

## 2019-11-10 DIAGNOSIS — G459 Transient cerebral ischemic attack, unspecified: Secondary | ICD-10-CM

## 2019-11-10 NOTE — Telephone Encounter (Signed)
Called patient back to relay message below. Call was disconnected. Tried to call patient back but no answer. Thank you

## 2019-11-10 NOTE — Telephone Encounter (Signed)
Patient called in needing to see if he can have the Imaging location changed that he has scheduled for tomorrow? He said that he has already had Imaging done at that office and would prefer another place for a second opinion? Please Call. Thank you

## 2019-11-10 NOTE — Telephone Encounter (Signed)
Patient called back and I relayed the previous message and he said that he has already called and cancelled his MRI for tomorrow and that he would like an MRI scheduled at another location for a second opinion. I made him aware that another Prior Auth would be needed and it could take a week or so. He said that would be okay. Thank you

## 2019-11-10 NOTE — Telephone Encounter (Signed)
Sent to [facility] for orders and placed

## 2019-11-10 NOTE — Telephone Encounter (Signed)
No asnwer 129

## 2019-11-10 NOTE — Telephone Encounter (Signed)
He can have this changed but he will need to cancel these at [place] himself and will require further testing at another place, but then prior authorization will need to be done and it may take up to a week, Dr. Tomi Likens wanted this done quickly so, please let him know thanks

## 2019-11-11 ENCOUNTER — Other Ambulatory Visit: Payer: PPO

## 2019-11-23 ENCOUNTER — Other Ambulatory Visit: Payer: Self-pay

## 2019-11-23 ENCOUNTER — Telehealth: Payer: Self-pay

## 2019-11-23 DIAGNOSIS — G459 Transient cerebral ischemic attack, unspecified: Secondary | ICD-10-CM

## 2019-11-23 NOTE — Telephone Encounter (Signed)
Patient contacted to come in for labs. Orders placed

## 2019-11-24 ENCOUNTER — Other Ambulatory Visit: Payer: Self-pay

## 2019-11-24 ENCOUNTER — Other Ambulatory Visit: Payer: PPO

## 2019-11-24 DIAGNOSIS — G459 Transient cerebral ischemic attack, unspecified: Secondary | ICD-10-CM

## 2019-11-25 ENCOUNTER — Ambulatory Visit (HOSPITAL_BASED_OUTPATIENT_CLINIC_OR_DEPARTMENT_OTHER)
Admission: RE | Admit: 2019-11-25 | Discharge: 2019-11-25 | Disposition: A | Discharge status: Home / Self Care | Payer: PPO | Source: Ambulatory Visit | Attending: Neurology | Admitting: Neurology

## 2019-11-25 ENCOUNTER — Encounter (HOSPITAL_BASED_OUTPATIENT_CLINIC_OR_DEPARTMENT_OTHER): Payer: Self-pay

## 2019-11-25 DIAGNOSIS — G459 Transient cerebral ischemic attack, unspecified: Secondary | ICD-10-CM | POA: Insufficient documentation

## 2019-11-25 LAB — BUN+CREAT
BUN/Creatinine Ratio: 14 (ref 10–24)
BUN: 20 mg/dL (ref 8–27)
Creatinine, Ser: 1.38 mg/dL — ABNORMAL HIGH (ref 0.76–1.27)
GFR calc Af Amer: 54 mL/min/{1.73_m2} — ABNORMAL LOW (ref >59)
GFR calc non Af Amer: 47 mL/min/{1.73_m2} — ABNORMAL LOW (ref >59)

## 2019-11-25 LAB — SPECIMEN STATUS REPORT

## 2019-11-25 MED ORDER — IOHEXOL 350 MG/ML SOLN
100.0000 mL | Freq: Once | INTRAVENOUS | Status: AC | PRN
  Administered 2019-11-25: 100 mL via INTRAVENOUS

## 2019-11-26 ENCOUNTER — Other Ambulatory Visit: Payer: Self-pay

## 2019-11-26 DIAGNOSIS — G459 Transient cerebral ischemic attack, unspecified: Secondary | ICD-10-CM

## 2019-12-04 ENCOUNTER — Inpatient Hospital Stay: Admission: RE | Admit: 2019-12-04 | Payer: PPO | Source: Ambulatory Visit

## 2019-12-07 ENCOUNTER — Other Ambulatory Visit: Payer: Self-pay

## 2019-12-07 ENCOUNTER — Ambulatory Visit
Admission: RE | Admit: 2019-12-07 | Discharge: 2019-12-07 | Disposition: A | Discharge status: Home / Self Care | Payer: PPO | Source: Ambulatory Visit | Attending: Neurology | Admitting: Neurology

## 2019-12-07 DIAGNOSIS — G459 Transient cerebral ischemic attack, unspecified: Secondary | ICD-10-CM

## 2019-12-07 MED ORDER — GADOBENATE DIMEGLUMINE 529 MG/ML IV SOLN
16.0000 mL | Freq: Once | INTRAVENOUS | Status: AC | PRN
  Administered 2019-12-07: 16 mL via INTRAVENOUS

## 2020-01-05 DIAGNOSIS — L812 Freckles: Secondary | ICD-10-CM | POA: Diagnosis not present

## 2020-01-05 DIAGNOSIS — L821 Other seborrheic keratosis: Secondary | ICD-10-CM | POA: Diagnosis not present

## 2020-01-05 DIAGNOSIS — L570 Actinic keratosis: Secondary | ICD-10-CM | POA: Diagnosis not present

## 2020-01-14 ENCOUNTER — Ambulatory Visit: Payer: PPO | Attending: Internal Medicine

## 2020-01-14 DIAGNOSIS — Z23 Encounter for immunization: Secondary | ICD-10-CM

## 2020-01-14 NOTE — Progress Notes (Signed)
   Covid-19 Vaccination Clinic  Name:  Brian Fitzpatrick    MRN: [id number] DOB: 1934-09-03  01/14/2020  [name] was observed post Covid-19 immunization for 15 minutes without incident. He was provided with Vaccine Information Sheet and instruction to access the V-Safe system.   [name] was instructed to call 911 with any severe reactions post vaccine: Marland Kitchen Difficulty breathing  . Swelling of face and throat  . A fast heartbeat  . A bad rash all over body  . Dizziness and weakness   Immunizations Administered    Name Date Dose VIS Date Route   Pfizer COVID-19 Vaccine 01/14/2020 10:52 AM 0.3 mL 10/23/2019 Intramuscular   Manufacturer: [place]   Lot: [id number]   [place]: [id number]

## 2020-01-22 ENCOUNTER — Telehealth: Payer: Self-pay | Admitting: Neurology

## 2020-01-22 NOTE — Telephone Encounter (Signed)
Feb 25,26,27 patient had an  episode ,right eye vision loss. Feeling fine now. Vision came back, but refused to go to ER. Just Fyi. He wants to go on vacation etc, he isnt sure if he should go. I advised ,I would send message to you to review.

## 2020-01-22 NOTE — Telephone Encounter (Signed)
Patient called in wanting to let Dr. Tomi Likens know that he has had some TIA events. He said he has had 5 now. Please Call. Thank you

## 2020-01-25 NOTE — Telephone Encounter (Signed)
I am seeing him on Wednesday, so we can discuss then.

## 2020-01-27 DIAGNOSIS — E1169 Type 2 diabetes mellitus with other specified complication: Secondary | ICD-10-CM | POA: Diagnosis not present

## 2020-01-27 DIAGNOSIS — R030 Elevated blood-pressure reading, without diagnosis of hypertension: Secondary | ICD-10-CM | POA: Diagnosis not present

## 2020-01-27 DIAGNOSIS — G459 Transient cerebral ischemic attack, unspecified: Secondary | ICD-10-CM | POA: Diagnosis not present

## 2020-01-27 DIAGNOSIS — E7800 Pure hypercholesterolemia, unspecified: Secondary | ICD-10-CM | POA: Diagnosis not present

## 2020-01-27 DIAGNOSIS — R519 Headache, unspecified: Secondary | ICD-10-CM | POA: Diagnosis not present

## 2020-01-27 DIAGNOSIS — E0869 Diabetes mellitus due to underlying condition with other specified complication: Secondary | ICD-10-CM | POA: Diagnosis not present

## 2020-01-27 DIAGNOSIS — I679 Cerebrovascular disease, unspecified: Secondary | ICD-10-CM | POA: Diagnosis not present

## 2020-01-27 NOTE — Progress Notes (Signed)
NEUROLOGY FOLLOW UP OFFICE NOTE  Brian Fitzpatrick [id number]  HISTORY OF PRESENT ILLNESS: Brian Fitzpatrick is an 86 year old Caucasian man who follows up for TIA.  UPDATE: Current medications: Plavix '75mg'$ ; metformin; atorvastatin '10mg'$   CT of head on 11/25/2019 personally reviewed and showed an 11 x 3 mm focus of hyperdensity within the left occipital lobe, possibly an acute/subacute parenchymal hemorrhage, small infarct with hemorrhagic conversion or other hyperdense lesion.  CTA head again showed high-grade P1 and P2 stenoses within the bilateral posterior cerebral arteries with no definite left PCA distal branch occlusion as well as atherosclerotic irregularity of the bilateral M2 MCA branches.  CTA of the neck showed no hemodynamically significant ICA stenosis, dominant right vertebral artery without stenosis and mixed plaque at the origin of the non-dominant left vertebral artery with resultant moderate ostial stenosis.  Follow up MRI of brain with and without contrast on 12/07/2019 personally reviewed confirmed acute to subacute left PCA territory infarct at the left occipital pole but no hemorrhage or mass effect.  He was advised to start ASA '81mg'$  daily in addition to Plavix '75mg'$  daily.  Then he had a recurrence of transient vision loss in the right eye with headache and dizziness on 2/25, 26, and 27.  HISTORY: In late February2020, he had an episode of speech disturbance. He was able to get words out but the words were jumbled. He was able to understand others. There was no associated slurred speech, facial droop, dizziness or unilateral numbness or weakness. Episode lasted about 7-8 minutes. Later that evening, he started having a dull daily headache that was treated well with Tylenol.  He was started on ASA '81mg'$  daily and atorvastatin '10mg'$  daily by his PCP. He had a 2D echocardiogram on 01/13/19 which demonstrated a normal LV EF 60-65% with no cardiac source of embolus.Carotid doppler  from 01/16/19 showed mild bilateral carotid atherosclerosis with less than 50% stenosis bilaterally as well as antegrade flow of both vertebral arteries. MRI of brain with and without contrast from 01/20/19 was personally reviewed and demonstrated moderate chronic small vessel ischemic changes but no acute intracranial abnormality. MRA of head showed intracranial atherosclerotic arterial disease with moderate bilateral proximal M2 stenoses and severe bilateral P1 and P2 stenoses.  In early April 2020,he had a second event. He had the same language disturbance but also had visual disturbance as well. He was reading a sign when his vision was obscured by dark spots in his vision. After discussion with me, Dr. Laurann Montana started him on Plavix '75mg'$  daily in addition to the ASA. He alsowastaking atorvastatin '10mg'$  daily but was discontinued as he had an LDL of 73. He has had no recurrent events. Headaches have since resolved.  Cardiac event monitor from 06/20/19 to 06/23/19 revealed no significant arrhythmias.  In December 2020, he saw his ophthalmologist, Dr. Katy Fitch, in which he reportedly has endorsed experiencing recurrent visual disturbance since his last TIA in April.  He reports vision loss on the right side of his visual field only involving the right eye.  It would last an hour and resolve.  It had been occurring daily.  ESR was 11 and CRP 1.0.    He is a former smoker. He has type 2 diabetes. His sister had passed away from strokes. He denies history of migraines.  PAST MEDICAL HISTORY: Past Medical History:  Diagnosis Date  . Arthritis   . BPH (benign prostatic hyperplasia)   . Diabetes mellitus    Borderline  DX 2009  . Vertigo    left ear sets it off/ has "crystals" in it    MEDICATIONS: Current Outpatient Medications on File Prior to Visit  Medication Sig Dispense Refill  . atorvastatin (LIPITOR) 10 MG tablet Take 10 mg by mouth daily.    Marland Kitchen b complex vitamins tablet Take 1  tablet by mouth daily.    . Cholecalciferol (VITAMIN D) 2000 units CAPS Take 2,000-4,000 Units by mouth See admin instructions. Take 4000 units in the morning and 2000 units in the evening    . Chromium 1 MG CAPS Take 1 capsule by mouth daily.    . clopidogrel (PLAVIX) 75 MG tablet TK 1 T PO QD    . Coenzyme Q10 (CO Q 10) 100 MG CAPS Take 100 mg by mouth 2 (two) times daily.    . Cyanocobalamin (B-12) 2500 MCG TABS Take 2,500 mcg by mouth 2 (two) times daily.    Marland Kitchen DHEA 50 MG TABS Take 50 mg by mouth daily.    Marland Kitchen GLUCOSAMINE-MSM-HYALURONIC ACD PO Take 1 tablet by mouth 2 (two) times daily.    . metFORMIN (GLUCOPHAGE) 500 MG tablet Take 500 mg by mouth daily.     . methylPREDNISolone (MEDROL DOSEPAK) 4 MG TBPK tablet FPD    . milk thistle 175 MG tablet Take 175 mg by mouth 2 (two) times daily.    . Multiple Vitamins-Minerals (OCUVITE-LUTEIN PO) Take 1 tablet by mouth daily.    Marland Kitchen OVER THE COUNTER MEDICATION Take 1 tablet by mouth 2 (two) times daily. Prostate health supplement    . OVER THE COUNTER MEDICATION Take 560 tablets by mouth 2 (two) times daily. Hawthorne Berry supplement     . Resveratrol 250 MG CAPS Take 250 mg by mouth daily.    . Selenium 100 MCG CAPS Take 100 mcg by mouth daily.    Marland Kitchen Specialty Vitamins Products (BRAIN) TABS Take 1 tablet by mouth 2 (two) times daily.    . Taurine 1000 MG CAPS Take 1,000 mg by mouth daily.    . Turmeric Curcumin 500 MG CAPS Take 500-1,000 mg by mouth See admin instructions. Take 1000 mg in the morning and 500 in the evening    . vitamin C (ASCORBIC ACID) 500 MG tablet Take 1,000 mg by mouth 2 (two) times daily.      No current facility-administered medications on file prior to visit.    ALLERGIES: No Known Allergies  FAMILY HISTORY: Family History  Problem Relation Age of Onset  . Colon cancer Sister   . Colon cancer Brother   . Colon cancer Other    SOCIAL HISTORY: Social History   Socioeconomic History  . Marital status: Married     Spouse name: Brian Fitzpatrick [facility]  . Number of children: 3  . Years of education: Not on file  . Highest education level: Not on file  Occupational History  . Occupation: retired    Comment: Retired  Tobacco Use  . Smoking status: Former Smoker    Types: Cigarettes  . Smokeless tobacco: Never Used  . Tobacco comment: 45 YRS AGO.  Substance and Sexual Activity  . Alcohol use: Yes    Alcohol/week: 3.0 standard drinks    Types: 3 Glasses of wine per week    Comment: Occasional  . Drug use: No  . Sexual activity: Not on file  Other Topics Concern  . Not on file  Social History Narrative   Lives with wife in single level home  College edu      Right handed      Caffeine none      Exercise yes walk 3 miles /day    Social Determinants of Health   Financial Resource Strain:   . Difficulty of Paying Living Expenses:   Food Insecurity:   . Worried About Charity fundraiser in the Last Year:   . Arboriculturist in the Last Year:   Transportation Needs:   . Film/video editor (Medical):   Marland Kitchen Lack of Transportation (Non-Medical):   Physical Activity:   . Days of Exercise per Week:   . Minutes of Exercise per Session:   Stress:   . Feeling of Stress :   Social Connections:   . Frequency of Communication with Friends and Family:   . Frequency of Social Gatherings with Friends and Family:   . Attends Religious Services:   . Active Member of Clubs or Organizations:   . Attends Archivist Meetings:   Marland Kitchen Marital Status:   Intimate Partner Violence:   . Fear of Current or Ex-Partner:   . Emotionally Abused:   Marland Kitchen Physically Abused:   . Sexually Abused:      PHYSICAL EXAM: Blood pressure (!) 180/80, pulse 66, resp. rate 18, height '5\' 8"'$  (1.727 m), weight 185 lb (83.9 kg), SpO2 95 %. General: No acute distress.  Patient appears well-groomed.   Head:  Normocephalic/atraumatic Eyes:  Fundi examined but not visualized Neck: supple, no paraspinal tenderness, full range  of motion Heart:  Regular rate and rhythm Lungs:  Clear to auscultation bilaterally Back: No paraspinal tenderness Neurological Exam: alert and oriented to person, place, and time. Attention span and concentration intact, recent and remote memory intact, fund of knowledge intact.  Speech fluent and not dysarthric, language intact.  CN II-XII intact. Bulk and tone normal, muscle strength 5/5 throughout.  Sensation to light touch  intact.  Deep tendon reflexes 2+ throughout, toes downgoing.  Finger to nose  testing intact.  Gait normal.  IMPRESSION: 1.  Left occipital infarct secondary, likely secondary to left PCA stenosis.  He has severe bilateral PCA stenosis.  He has had recurrent TIAs, initially presenting as aphasia but then severe presenting as what appeared to be amaurosis fugax over the past year, however I suspect that these events probably represented occipital TIAs presenting with transient homonymous hemianopsia secondary to the PCA stenosis.  The aphasic episode may have represented thalamic aphasia.  Given the recurrent stereotypical presentation of these events, unlikely to be of a cardioembolic source. 2.  Elevated blood pressure.  3.  Type 2 diabetes mellitus 4.  Hyperlipidemia 5.  History of tobacco use disorder  PLAN: I have discussed Mr. Dini case (with his permission) with Dr. Rosalin Hawking, our stroke specialist at [facility]: 1.  Increase ASA to '325mg'$  daily.  Continue Plavix '75mg'$  daily.  If he should have a recurrence of these TIAs, then I would check a P2Y12 level in the morning prior to his Plavix dose and if higher than 150, I would switch him to brilinta (and continue ASA '81mg'$  daily). 2.  On atorvastatin '10mg'$  daily.  PCP checked lipid panel yesterday.  Will review.  LDL goal less than 70. 3.  Advised to contact PCP's office regarding blood pressure.  Given the intracranial stenosis, would keep blood pressure mildly elevated (778-242 systolic) 4.  Glycemic control  (Hgb A1c goal less than 7.0) 5.  Advised to follow up with Dr. Katy Fitch  for repeat eye exam and visual field testing to evaluate for any residual homonymous hemianopsia (grossly not appreciated on today's exam) 6.  Follow up in 3 to 4 months.  Metta Clines, DO  CC: Lavone Orn, MD

## 2020-01-28 ENCOUNTER — Other Ambulatory Visit: Payer: Self-pay

## 2020-01-28 ENCOUNTER — Ambulatory Visit (INDEPENDENT_AMBULATORY_CARE_PROVIDER_SITE_OTHER): Payer: PPO | Admitting: Neurology

## 2020-01-28 ENCOUNTER — Encounter: Payer: Self-pay | Admitting: Neurology

## 2020-01-28 VITALS — BP 160/80 | HR 66 | Resp 18 | Ht 68.0 in | Wt 185.0 lb

## 2020-01-28 DIAGNOSIS — E1159 Type 2 diabetes mellitus with other circulatory complications: Secondary | ICD-10-CM | POA: Diagnosis not present

## 2020-01-28 DIAGNOSIS — E785 Hyperlipidemia, unspecified: Secondary | ICD-10-CM

## 2020-01-28 DIAGNOSIS — Z8673 Personal history of transient ischemic attack (TIA), and cerebral infarction without residual deficits: Secondary | ICD-10-CM

## 2020-01-28 DIAGNOSIS — I63532 Cerebral infarction due to unspecified occlusion or stenosis of left posterior cerebral artery: Secondary | ICD-10-CM

## 2020-01-28 DIAGNOSIS — R030 Elevated blood-pressure reading, without diagnosis of hypertension: Secondary | ICD-10-CM | POA: Diagnosis not present

## 2020-01-28 NOTE — Patient Instructions (Addendum)
1.  Continue Plavix 75mg  daily.  Increase aspirin to 325mg  daily 2.  Continue diabetes medication 3.  Continue atorvastatin.  I will check your cholesterol and will let you know if any changes need to be made in dose.   4.  I would follow up with Dr. Katy Fitch to make sure you don't have any residual vision loss.  5.  I will contact one of our stroke specialists to see if he recommends any other testing or treatment. 6.  Follow up in 3 to 4 months.

## 2020-02-16 ENCOUNTER — Ambulatory Visit: Payer: PPO | Attending: Internal Medicine

## 2020-02-16 DIAGNOSIS — Z23 Encounter for immunization: Secondary | ICD-10-CM

## 2020-02-16 NOTE — Progress Notes (Signed)
   Covid-19 Vaccination Clinic  Name:  Brian Fitzpatrick    MRN: [id number] DOB: Jun 14, 1934  02/16/2020  [name] was observed post Covid-19 immunization for 15 minutes without incident. He was provided with Vaccine Information Sheet and instruction to access the V-Safe system.   [name] was instructed to call 911 with any severe reactions post vaccine: Marland Kitchen Difficulty breathing  . Swelling of face and throat  . A fast heartbeat  . A bad rash all over body  . Dizziness and weakness   Immunizations Administered    Name Date Dose VIS Date Route   Pfizer COVID-19 Vaccine 02/16/2020 10:42 AM 0.3 mL 10/23/2019 Intramuscular   Manufacturer: [employer]   Lot: [id number]   [place]: [id number]

## 2020-03-02 ENCOUNTER — Telehealth: Payer: Self-pay

## 2020-03-02 NOTE — Telephone Encounter (Signed)
If patient calls back please let him know provider will not be in the office this day. We will need to reschedule him to a different today. He needs to be schedule only in the new pt consult between 0800and 1130 slots only.  Vm was left for pt that provider will be out of office that day need to r/s new pt appt.

## 2020-03-15 DIAGNOSIS — T148XXA Other injury of unspecified body region, initial encounter: Secondary | ICD-10-CM | POA: Diagnosis not present

## 2020-03-28 ENCOUNTER — Ambulatory Visit: Payer: PPO | Admitting: Neurology

## 2020-04-06 ENCOUNTER — Encounter: Payer: Self-pay | Admitting: *Deleted

## 2020-04-08 ENCOUNTER — Ambulatory Visit: Payer: PPO | Admitting: Neurology

## 2020-04-08 ENCOUNTER — Encounter: Payer: Self-pay | Admitting: Neurology

## 2020-04-08 ENCOUNTER — Other Ambulatory Visit: Payer: Self-pay

## 2020-04-08 VITALS — BP 153/84 | HR 75 | Ht 68.0 in | Wt 185.5 lb

## 2020-04-08 DIAGNOSIS — I63532 Cerebral infarction due to unspecified occlusion or stenosis of left posterior cerebral artery: Secondary | ICD-10-CM | POA: Diagnosis not present

## 2020-04-08 DIAGNOSIS — Z8673 Personal history of transient ischemic attack (TIA), and cerebral infarction without residual deficits: Secondary | ICD-10-CM | POA: Insufficient documentation

## 2020-04-08 NOTE — Progress Notes (Signed)
PATIENT: Brian Fitzpatrick DOB: 10-11-1934  Chief Complaint  Patient presents with  . New Patient (Initial Visit)    Rm 4, Hx stroke, TIAs over a period of a year), Second opinion.  . Ref by Dr. Laurann Montana     HISTORICAL  Brian Fitzpatrick is a 86 year old male, seen in request by his primary physician Dr. Laurann Montana, Jenny Reichmann for second opinion concerning his stroke, initial evaluation was on Apr 08, 2020.  I have reviewed and summarized the referring note from the referring physician.  He had a past medical history of  hyperlipidemia, diabetes,  has been a patient of [facility] neurologist Dr. Jodean Lima since September 2020, most recent office visit was on January 28, 2020,  He presented with sudden onset slurred speech, expressive aphasia, headache in February 2020, episode lasted less than 10 minutes, MRI of the brain in March 2020 showed moderate supratentorium small vessel disease, no acute abnormality, MRI of the brain showed intracranial atherosclerotic disease, ultrasound of carotid arteries showed less than 50% stenosis bilaterally, echocardiogram showed ejection fraction 60 to 75% no cardiac embolic source.  He was treated with aspirin 81 mg daily, and Lipitor 10 mg by Dr. Laurann Montana,  He had a few recurrent similar episode, documented in early April 2020, language disturbance with associated headache lasting 10 minutes, also complains of visual field deficit, mainly involving his right visual field, he was started on 75 mg daily in addition to his aspirin 81.  His Lipitor 10 mg was discontinued because his LDL was 73,  Cardiac monitoring from August 8-11, 2020 revealed no significant arrhythmia  He was seen by Dr. Carolynn Sayers in December 2020, recurrent episode of visual disturbance similar to what he had experienced in April, also associated with mild to moderate headaches.  Lasting less than 10 minutes, ESR C-reactive protein was normal  Most recent episode was in January 2021, he again had sudden onset  right visual field deficit, with mild headaches, patient was noted to have mild memory loss during history taking, difficulty to give a clear story line, his visual difficulty has returned back to baseline, since then, he has been treated with Plavix 75 mg, plus aspirin 325 mg,  He wants to have second opinion regarding his medication management to preventing his stroke, most concerned about side effect of Lipitor, wants to take over-the-counter supplement, instead of prescription medications, he is very concerned about potential recurrent stroke   I personally reviewed MRI of the brain from December 07, 2019, acute to subacute left PCA stroke involving left occipital pole, there is no evidence of acute hemorrhage, moderate supratentorium small vessel disease  MRI of the brain in January 2013, no acute stroke, moderate small vessel disease  Laboratory evaluations in 2021, LDL 59, A1c in 2019 was 6.4 REVIEW OF SYSTEMS: Full 14 system review of systems performed and notable only for as above All other review of systems were negative.  ALLERGIES: No Known Allergies  HOME MEDICATIONS: Current Outpatient Medications  Medication Sig Dispense Refill  . aspirin 325 MG tablet Take 325 mg by mouth daily.    Marland Kitchen atorvastatin (LIPITOR) 10 MG tablet Take 10 mg by mouth daily.    Marland Kitchen b complex vitamins tablet Take 1 tablet by mouth daily.    . Cholecalciferol (VITAMIN D) 2000 units CAPS Take 2,000-4,000 Units by mouth See admin instructions. Take 4000 units in the morning and 2000 units in the evening    . Chromium 1 MG CAPS Take 1 capsule by  mouth daily.    . clopidogrel (PLAVIX) 75 MG tablet TK 1 T PO QD    . Coenzyme Q10 (CO Q 10) 100 MG CAPS Take 100 mg by mouth 2 (two) times daily.    . Cyanocobalamin (B-12) 2500 MCG TABS Take 2,500 mcg by mouth 2 (two) times daily.    Marland Kitchen DHEA 50 MG TABS Take 50 mg by mouth daily.    Marland Kitchen GLUCOSAMINE-MSM-HYALURONIC ACD PO Take 1 tablet by mouth 2 (two) times daily.    .  hydrocortisone 2.5 % cream APPLY EXTERNALLY TO THE AFFECTED AREA TWICE DAILY    . metFORMIN (GLUCOPHAGE) 500 MG tablet Take 500 mg by mouth daily.     . methylPREDNISolone (MEDROL DOSEPAK) 4 MG TBPK tablet FPD    . milk thistle 175 MG tablet Take 175 mg by mouth 2 (two) times daily.    . Multiple Vitamins-Minerals (OCUVITE-LUTEIN PO) Take 1 tablet by mouth daily.    Glory Rosebush VERIO test strip FOR USE WHEN CHECKING BLOOD GLUCOSE ONCE A DAY    . OVER THE COUNTER MEDICATION Take 1 tablet by mouth 2 (two) times daily. Prostate health supplement    . OVER THE COUNTER MEDICATION Take 560 tablets by mouth 2 (two) times daily. Hawthorne Berry supplement     . Resveratrol 250 MG CAPS Take 250 mg by mouth daily.    . Selenium 100 MCG CAPS Take 100 mcg by mouth daily.    Marland Kitchen Specialty Vitamins Products (BRAIN) TABS Take 1 tablet by mouth 2 (two) times daily.    . Taurine 1000 MG CAPS Take 1,000 mg by mouth daily.    . Turmeric Curcumin 500 MG CAPS Take 500-1,000 mg by mouth See admin instructions. Take 1000 mg in the morning and 500 in the evening    . vitamin C (ASCORBIC ACID) 500 MG tablet Take 1,000 mg by mouth 2 (two) times daily.      No current facility-administered medications for this visit.    PAST MEDICAL HISTORY: Past Medical History:  Diagnosis Date  . Arthritis   . BPH (benign prostatic hyperplasia)   . CVA (cerebral vascular accident) ([facility])   . Diabetes mellitus    Borderline    DX 2009  . Hypercholesterolemia   . Spinal stenosis   . TIA (transient ischemic attack)   . Vertigo    left ear sets it off/ has "crystals" in it    PAST SURGICAL HISTORY: Past Surgical History:  Procedure Laterality Date  . BACK SURGERY  06/2018   lumbar decompression, fusion L 2-3-4-5-S1  . HERNIA REPAIR    . KNEE SURGERY Right   . SHOULDER SURGERY Right     FAMILY HISTORY: Family History  Problem Relation Age of Onset  . Colon cancer Sister   . Stroke Sister   . Diabetes Sister   . Colon  cancer Brother   . Diabetes Brother   . Colon cancer Other     SOCIAL HISTORY: Social History   Socioeconomic History  . Marital status: Married    Spouse name: [facility] ANN [facility]  . Number of children: 3  . Years of education: college  . Highest education level: Not on file  Occupational History  . Occupation: retired    Comment: Retired  Tobacco Use  . Smoking status: Former Smoker    Types: Cigarettes  . Smokeless tobacco: Never Used  . Tobacco comment: 45 YRS AGO.  Substance and Sexual Activity  . Alcohol use: Yes  Alcohol/week: 3.0 standard drinks    Types: 3 Glasses of wine per week    Comment: Occasional  . Drug use: No  . Sexual activity: Not on file  Other Topics Concern  . Not on file  Social History Narrative   Lives with wife in single level home      College edu      Right handed      Caffeine none      Exercise yes walk 3 miles /day    Social Determinants of Health   Financial Resource Strain:   . Difficulty of Paying Living Expenses:   Food Insecurity:   . Worried About Charity fundraiser in the Last Year:   . Arboriculturist in the Last Year:   Transportation Needs:   . Film/video editor (Medical):   Marland Kitchen Lack of Transportation (Non-Medical):   Physical Activity:   . Days of Exercise per Week:   . Minutes of Exercise per Session:   Stress:   . Feeling of Stress :   Social Connections:   . Frequency of Communication with Friends and Family:   . Frequency of Social Gatherings with Friends and Family:   . Attends Religious Services:   . Active Member of Clubs or Organizations:   . Attends Archivist Meetings:   Marland Kitchen Marital Status:   Intimate Partner Violence:   . Fear of Current or Ex-Partner:   . Emotionally Abused:   Marland Kitchen Physically Abused:   . Sexually Abused:      PHYSICAL EXAM   Vitals:   04/08/20 1014  BP: (!) 153/84  Pulse: 75  Weight: 185 lb 8 oz (84.1 kg)  Height: '5\' 8"'$  (1.727 m)    Not recorded      Body  mass index is 28.21 kg/m.  PHYSICAL EXAMNIATION:  Gen: NAD, conversant, well nourised, well groomed                     Cardiovascular: Regular rate rhythm, no peripheral edema, warm, nontender. Eyes: Conjunctivae clear without exudates or hemorrhage Neck: Supple, no carotid bruits. Pulmonary: Clear to auscultation bilaterally   NEUROLOGICAL EXAM:  MENTAL STATUS: Speech:    Speech is normal; fluent and spontaneous with normal comprehension.  Cognition:     Orientation to time, place and person     Normal recent and remote memory     Normal Attention span and concentration     Normal Language, naming, repeating,spontaneous speech     Fund of knowledge   CRANIAL NERVES: CN II: Visual fields are full to confrontation. Pupils are round equal and briskly reactive to light. CN III, IV, VI: extraocular movement are normal. No ptosis. CN V: Facial sensation is intact to light touch CN VII: Face is symmetric with normal eye closure  CN VIII: Hearing is normal to causal conversation. CN IX, X: Phonation is normal. CN XI: Head turning and shoulder shrug are intact  MOTOR: There is no pronator drift of out-stretched arms. Muscle bulk and tone are normal. Muscle strength is normal.  REFLEXES: Reflexes are 2+ and symmetric at the biceps, triceps, knees, and ankles. Plantar responses are flexor.  SENSORY: Intact to light touch, pinprick and vibratory sensation are intact in fingers and toes.  COORDINATION: There is no trunk or limb dysmetria noted.  GAIT/STANCE: Posture is normal. Gait is steady with normal steps, base, arm swing, and turning.     DIAGNOSTIC DATA (LABS, IMAGING, TESTING) -  I reviewed patient records, labs, notes, testing and imaging myself where available.   ASSESSMENT AND PLAN  Brian Fitzpatrick is a 86 y.o. male   Left occipital stroke in January 2021,  Moderate generalized atrophy, supratentorium small vessel disease, grading echo also showed scattered  microhemorrhage,  He has vascular risk factor of aging, hyperlipidemia, diabetes  He would benefit moderate exercise, increased water intake, LDL should be less than 70,  He should be on single antiplatelet agent 75 mg daily,  I will refer him to stroke physician Dr. Leonie Man for second opinion      Marcial Pacas, M.D. Ph.D.  [facility] [address], [place], [place] [address] Ph: [phone] Fax: [phone]  CC: Lavone Orn, MD

## 2020-04-08 NOTE — Patient Instructions (Signed)
Keep Plavix 75mg  daily.

## 2020-04-18 ENCOUNTER — Telehealth: Payer: Self-pay | Admitting: Neurology

## 2020-04-18 NOTE — Telephone Encounter (Signed)
Patient called in wanting to see if Dr. Krista Blue has spoken to Dr. Leonie Man to schedule him an appointment. Pt was l/s 5/28 and was told that she was going to refer pt to Dr. Leonie Man. Plz call pt [phone]

## 2020-04-18 NOTE — Telephone Encounter (Signed)
Referral placed by Dr. Krista Blue for pt to see Dr. Leonie Man for second opinion on 04/08/2020.

## 2020-04-18 NOTE — Telephone Encounter (Signed)
Scheduled for 07/26 with pt on the phone

## 2020-06-06 ENCOUNTER — Ambulatory Visit: Payer: PPO | Admitting: Neurology

## 2020-06-06 ENCOUNTER — Encounter: Payer: Self-pay | Admitting: Neurology

## 2020-06-06 VITALS — BP 146/85 | HR 64 | Ht 68.0 in | Wt 185.0 lb

## 2020-06-06 DIAGNOSIS — G3184 Mild cognitive impairment, so stated: Secondary | ICD-10-CM

## 2020-06-06 DIAGNOSIS — I6930 Unspecified sequelae of cerebral infarction: Secondary | ICD-10-CM

## 2020-06-06 DIAGNOSIS — R413 Other amnesia: Secondary | ICD-10-CM

## 2020-06-06 MED ORDER — CEREFOLIN 6-1-50-5 MG PO TABS: 1.0000 | ORAL_TABLET | Freq: Every morning | ORAL | 3 refills | Status: AC

## 2020-06-06 NOTE — Progress Notes (Signed)
[facility] [address] [place]. [place] [address] [phone]       OFFICE CONSULT NOTE  [name] Date of Birth:  [birth year] Medical Record Number:  [id number]   Referring MD: Krista Fitzpatrick  Reason for Referral: Second opinion for stroke and TIA  HPI: Brian Fitzpatrick is a 86 year old pleasant Caucasian male who is accompanied today by his wife for initial consultation visit for second opinion for stroke and TIA.  History is obtained from the patient and his wife and review of electronic medical records and I personally reviewed pertinent imaging films in PACS.  He has past medical history of diabetes, hypertension, hyperlipidemia, TIAs and strokes.  He states he has had 5 strokes over period of a year and a half starting from January 2020.  Initially had an episode of slurred speech, expressive aphasia and headache lasting 10 minutes an MRI scan of the brain in March 2020 showed no acute abnormality but moderate changes of small vessel disease.  MRA showed significant intracranial atherosclerotic disease and carotid ultrasound showed less than 50% stenosis and echocardiogram showed normal ejection fraction.  He is started on aspirin and Lipitor.  He had recurrent similar episode in early April 2020 with language disturbance with mild headache again lasting only 10 minutes at this time he complained of visual field deficit and he is quite confident that its only in the right visual field of the right eye but not in the left eye.  Plavix 75 mg was added to his aspirin and his LDL cholesterol at that time was 73 mg percent and Lipitor was discontinued.  He underwent external cardiac monitoring for 5 days which showed no evidence of atrial fibrillation.  He subsequently saw Dr. Katy Fitch ophthalmologist in December 2020 for recurrent episodes of visual disturbances which were similar to the one he had experienced in April 2020.  ESR and C-reactive protein were normal.  His most recent episode  was in January 2021 when he had sudden onset of right eye visual field deficits in the temporal field with mild headaches as well as some confusion and mild memory difficulties.  This time his aspirin dose was increased to 325 and Plavix 75 mg was continued.  Patient was previously seen by Dr. Metta Clines from [facility] neurology and most recently wanted a second opinion and saw Dr. Evelena Leyden in our office on 04/08/2020 and has been referred to me for my opinion.  MRI scan of the brain on 12/07/2019 shows subacute left posterior cerebral artery infarct involving left occipital pole with moderate changes of small vessel disease.  CT angiogram of the brain on 11/25/2019 showed high-grade P1 and P2 stenosis involving bilateral posterior cerebral arteries with irregularities of bilateral M2 middle cerebral artery branches as well.  CT angiogram of the neck showed no significant extracranial stenosis.  Patient denies any known history of migraine headaches or visual disturbances in the past until last January.  He is tolerating aspirin 325 mg daily and Plavix 75 mg daily with minimum bruising but no bleeding.  He states his diabetes control has yet quite poor with fasting CBGs ranging from 130-140 range.  His blood pressure seems to be quite well controlled at home though today it is slightly elevated in the office at 146/85.  Patient does complain of mild short-term memory difficulties and often forgets where he keeps his phone.  He feels his memory difficulties are not progressive.  He has been taking a bunch of vitamins as well as  resveratrol for his memory.  He denies any history of significant head injury with loss of consciousness, seizures family history of dementia. ROS:   14 system review of systems is positive for memory loss, vision disturbance, bruising and all other systems negative  PMH:  Past Medical History:  Diagnosis Date  . Arthritis   . BPH (benign prostatic hyperplasia)   . CVA (cerebral vascular  accident) ([facility])   . Diabetes mellitus    Borderline    DX 2009  . Hypercholesterolemia   . Spinal stenosis   . TIA (transient ischemic attack)   . Vertigo    left ear sets it off/ has "crystals" in it    Social History:  Social History   Socioeconomic History  . Marital status: Married    Spouse name: [facility]  . Number of children: 3  . Years of education: college  . Highest education level: Not on file  Occupational History  . Occupation: retired    Comment: Retired  Tobacco Use  . Smoking status: Former Smoker    Types: Cigarettes  . Smokeless tobacco: Never Used  . Tobacco comment: 45 YRS AGO.  Vaping Use  . Vaping Use: Never used  Substance and Sexual Activity  . Alcohol use: Yes    Alcohol/week: 3.0 standard drinks    Types: 3 Glasses of wine per week    Comment: Occasional  . Drug use: No  . Sexual activity: Not on file  Other Topics Concern  . Not on file  Social History Narrative   Lives with wife in single level home      College edu      Right handed      Caffeine none      Exercise yes walk 3 miles /day    Social Determinants of Health   Financial Resource Strain:   . Difficulty of Paying Living Expenses:   Food Insecurity:   . Worried About Programme researcher, broadcasting/film/video in the Last Year:   . Barista in the Last Year:   Transportation Needs:   . Freight forwarder (Medical):   Marland Kitchen Lack of Transportation (Non-Medical):   Physical Activity:   . Days of Exercise per Week:   . Minutes of Exercise per Session:   Stress:   . Feeling of Stress :   Social Connections:   . Frequency of Communication with Friends and Family:   . Frequency of Social Gatherings with Friends and Family:   . Attends Religious Services:   . Active Member of Clubs or Organizations:   . Attends Banker Meetings:   Marland Kitchen Marital Status:   Intimate Partner Violence:   . Fear of Current or Ex-Partner:   . Emotionally Abused:   Marland Kitchen Physically Abused:   .  Sexually Abused:     Medications:   Current Outpatient Medications on File Prior to Visit  Medication Sig Dispense Refill  . atorvastatin (LIPITOR) 10 MG tablet Take 10 mg by mouth daily.    Marland Kitchen b complex vitamins tablet Take 1 tablet by mouth daily.    . Cholecalciferol (VITAMIN D) 2000 units CAPS Take 2,000-4,000 Units by mouth See admin instructions. Take 4000 units in the morning and 2000 units in the evening    . Chromium 1 MG CAPS Take 1 capsule by mouth daily.    . clopidogrel (PLAVIX) 75 MG tablet TK 1 T PO QD    . Coenzyme Q10 (CO Q 10) 100  MG CAPS Take 100 mg by mouth 2 (two) times daily.    Marland Kitchen DHEA 50 MG TABS Take 50 mg by mouth daily.    Marland Kitchen GLUCOSAMINE-MSM-HYALURONIC ACD PO Take 1 tablet by mouth 2 (two) times daily.    . hydrocortisone 2.5 % cream APPLY EXTERNALLY TO THE AFFECTED AREA TWICE DAILY    . metFORMIN (GLUCOPHAGE) 500 MG tablet Take 500 mg by mouth daily.     . methylPREDNISolone (MEDROL DOSEPAK) 4 MG TBPK tablet FPD    . milk thistle 175 MG tablet Take 175 mg by mouth 2 (two) times daily.    . Multiple Vitamins-Minerals (OCUVITE-LUTEIN PO) Take 1 tablet by mouth daily.    Glory Rosebush VERIO test strip FOR USE WHEN CHECKING BLOOD GLUCOSE ONCE A DAY    . OVER THE COUNTER MEDICATION Take 1 tablet by mouth 2 (two) times daily. Prostate health supplement    . OVER THE COUNTER MEDICATION Take 560 tablets by mouth 2 (two) times daily. Hawthorne Berry supplement     . Resveratrol 250 MG CAPS Take 250 mg by mouth daily.    . Selenium 100 MCG CAPS Take 100 mcg by mouth daily.    Marland Kitchen Specialty Vitamins Products (BRAIN) TABS Take 1 tablet by mouth 2 (two) times daily.    . Taurine 1000 MG CAPS Take 1,000 mg by mouth daily.    . Turmeric Curcumin 500 MG CAPS Take 500-1,000 mg by mouth See admin instructions. Take 1000 mg in the morning and 500 in the evening    . vitamin C (ASCORBIC ACID) 500 MG tablet Take 1,000 mg by mouth 2 (two) times daily.      No current facility-administered  medications on file prior to visit.    Allergies:  No Known Allergies  Physical Exam General: well developed, well nourished, seated, in no evident distress Head: head normocephalic and atraumatic.   Neck: supple with no carotid or supraclavicular bruits Cardiovascular: regular rate and rhythm, no murmurs Musculoskeletal: no deformity Skin:  no rash/petichiae Vascular:  Normal pulses all extremities  Neurologic Exam Mental Status: Awake and fully alert. Oriented to place and time. Recent and remote memory intact. Attention span, concentration and fund of knowledge appropriate. Mood and affect appropriate.  Diminished recall 0/3.  Able to name only 11 animals which can walk on 4 legs.  Able to copy intersecting pentagons well.  Clock drawing 3/4. Cranial Nerves: Fundoscopic exam reveals sharp disc margins. Pupils equal, briskly reactive to light. Extraocular movements full without nystagmus. Visual fields full to confrontation mostly except possibly slight peripheral restriction of vision in the right eye temporal field only.Marland Kitchen Hearing intact. Facial sensation intact. Face, tongue, palate moves normally and symmetrically.  Motor: Normal bulk and tone. Normal strength in all tested extremity muscles. Sensory.: intact to touch , pinprick , position and vibratory sensation.  Coordination: Rapid alternating movements normal in all extremities. Finger-to-nose and heel-to-shin performed accurately bilaterally. Gait and Station: Arises from chair without difficulty. Stance is normal. Gait demonstrates normal stride length and balance . Able to heel, toe and tandem walk with mild difficulty.  Reflexes: 1+ and symmetric. Toes downgoing.   NIHSS  0 Modified Rankin  1  ASSESSMENT: 86 year old Caucasian male with recurrent transient episodes of monocular right temporal field vision loss and vision disturbance with and without transient speech disturbance possibly TIAs given history of significant  bilateral intracranial atherosclerosis though complicated migraine and occipital seizures are possible though less likely.  Left occipital infarct in January  2021 likely due to intracranial atherosclerosis.  Multiple vascular risk factors of intracranial atherosclerosis, hyperlipidemia, diabetes hypertension.  He also has memory difficulties due to mild cognitive impairment.   PLAN: I had a long d/w patient and his wife about his recent  Left occipital stroke,intracranial atherosclerosis,mild cognitive impairment, risk for recurrent stroke/TIAs, personally independently reviewed imaging studies and stroke evaluation results and answered questions.Continue Plavix 75 mg daily alone and discontinue aspirin now since it has been more than 3 months since his stroke as I do not believe there is definite benefit of dual antiplatelet therapy in the long-term for secondary stroke prevention and maintain strict control of hypertension with blood pressure goal below 130/90, diabetes with hemoglobin A1c goal below 6.5% and lipids with LDL cholesterol goal below 70 mg/dL. I also advised the patient to eat a healthy diet with plenty of whole grains, cereals, fruits and vegetables, exercise regularly and maintain ideal body weight.  He also has mild cognitive impairment and family history of Alzheimer's hence recommend check memory panel labs, EEG.  Discontinue vitamin B12 tablets and start Cerefolin NAC 1 tablet daily and continue regular withdrawal and increase participation in cognitively challenging activities like solving crossword puzzles, playing bridge and sodoku.  We also discussed memory compensation strategies greater than 50% time during this 50-minute consultation visit was spent on counseling and coordination of care about his episodes of TIAs, left occipital stroke, mild cognitive impairment and memory loss and answering questions.followup in the future with Brian Fitzpatrick and no schedule follow-up with me is  necessary. Brian Contras, MD  [facility] [address] [place], [place] [address]  Phone [phone] Fax [phone] Note: This document was prepared with digital dictation and possible smart phrase technology. Any transcriptional errors that result from this process are unintentional.

## 2020-06-06 NOTE — Patient Instructions (Signed)
I had a long d/w patient and his wife about his recent  Left occipital stroke,intracranial atherosclerosis,mild cognitive impairment, risk for recurrent stroke/TIAs, personally independently reviewed imaging studies and stroke evaluation results and answered questions.Continue Plavix 75 mg daily alone and discontinue aspirin now since it has been more than 3 months since his stroke as I do not believe there is definite benefit of dual antiplatelet therapy in the long-term for secondary stroke prevention and maintain strict control of hypertension with blood pressure goal below 130/90, diabetes with hemoglobin A1c goal below 6.5% and lipids with LDL cholesterol goal below 70 mg/dL. I also advised the patient to eat a healthy diet with plenty of whole grains, cereals, fruits and vegetables, exercise regularly and maintain ideal body weight.  He also has mild cognitive impairment and family history of Alzheimer's hence recommend check memory panel labs, EEG.  Discontinue vitamin B12 tablets and start Cerefolin NAC 1 tablet daily and continue regular withdrawal and increase participation in cognitively challenging activities like solving crossword puzzles, playing bridge and sodoku.  We also discussed memory compensation strategies followup in the future with Dr. Krista Blue and no schedule follow-up with me is necessary. Memory Compensation Strategies  1. Use "WARM" strategy.  W= write it down  A= associate it  R= repeat it  M= make a mental note  2.   You can keep a Social worker.  Use a 3-ring notebook with sections for the following: calendar, important names and phone numbers,  medications, doctors' names/phone numbers, lists/reminders, and a section to journal what you did  each day.   3.    Use a calendar to write appointments down.  4.    Write yourself a schedule for the day.  This can be placed on the calendar or in a separate section of the Memory Notebook.  Keeping a  regular schedule can help  memory.  5.    Use medication organizer with sections for each day or morning/evening pills.  You may need help loading it  6.    Keep a basket, or pegboard by the door.  Place items that you need to take out with you in the basket or on the pegboard.  You may also want to  include a message board for reminders.  7.    Use sticky notes.  Place sticky notes with reminders in a place where the task is performed.  For example: " turn off the  stove" placed by the stove, "lock the door" placed on the door at eye level, " take your medications" on  the bathroom mirror or by the place where you normally take your medications.  8.    Use alarms/timers.  Use while cooking to remind yourself to check on food or as a reminder to take your medicine, or as a  reminder to make a call, or as a reminder to perform another task, etc.   Stroke Prevention Some medical conditions and behaviors are associated with a higher chance of having a stroke. You can help prevent a stroke by making nutrition, lifestyle, and other changes, including managing any medical conditions you may have. What nutrition changes can be made?   Eat healthy foods. You can do this by: ? Choosing foods high in fiber, such as fresh fruits and vegetables and whole grains. ? Eating at least 5 or more servings of fruits and vegetables a day. Try to fill half of your plate at each meal with fruits and vegetables. ? Choosing lean  protein foods, such as lean cuts of meat, poultry without skin, fish, tofu, beans, and nuts. ? Eating low-fat dairy products. ? Avoiding foods that are high in salt (sodium). This can help lower blood pressure. ? Avoiding foods that have saturated fat, trans fat, and cholesterol. This can help prevent high cholesterol. ? Avoiding processed and premade foods.  Follow your health care provider's specific guidelines for losing weight, controlling high blood pressure (hypertension), lowering high cholesterol, and  managing diabetes. These may include: ? Reducing your daily calorie intake. ? Limiting your daily sodium intake to 1,500 milligrams (mg). ? Using only healthy fats for cooking, such as olive oil, canola oil, or sunflower oil. ? Counting your daily carbohydrate intake. What lifestyle changes can be made?  Maintain a healthy weight. Talk to your health care provider about your ideal weight.  Get at least 30 minutes of moderate physical activity at least 5 days a week. Moderate activity includes brisk walking, biking, and swimming.  Do not use any products that contain nicotine or tobacco, such as cigarettes and e-cigarettes. If you need help quitting, ask your health care provider. It may also be helpful to avoid exposure to secondhand smoke.  Limit alcohol intake to no more than 1 drink a day for nonpregnant women and 2 drinks a day for men. One drink equals 12 oz of beer, 5 oz of wine, or 1 oz of hard liquor.  Stop any illegal drug use.  Avoid taking birth control pills. Talk to your health care provider about the risks of taking birth control pills if: ? You are over 8 years old. ? You smoke. ? You get migraines. ? You have ever had a blood clot. What other changes can be made?  Manage your cholesterol levels. ? Eating a healthy diet is important for preventing high cholesterol. If cholesterol cannot be managed through diet alone, you may also need to take medicines. ? Take any prescribed medicines to control your cholesterol as told by your health care provider.  Manage your diabetes. ? Eating a healthy diet and exercising regularly are important parts of managing your blood sugar. If your blood sugar cannot be managed through diet and exercise, you may need to take medicines. ? Take any prescribed medicines to control your diabetes as told by your health care provider.  Control your hypertension. ? To reduce your risk of stroke, try to keep your blood pressure below  130/80. ? Eating a healthy diet and exercising regularly are an important part of controlling your blood pressure. If your blood pressure cannot be managed through diet and exercise, you may need to take medicines. ? Take any prescribed medicines to control hypertension as told by your health care provider. ? Ask your health care provider if you should monitor your blood pressure at home. ? Have your blood pressure checked every year, even if your blood pressure is normal. Blood pressure increases with age and some medical conditions.  Get evaluated for sleep disorders (sleep apnea). Talk to your health care provider about getting a sleep evaluation if you snore a lot or have excessive sleepiness.  Take over-the-counter and prescription medicines only as told by your health care provider. Aspirin or blood thinners (antiplatelets or anticoagulants) may be recommended to reduce your risk of forming blood clots that can lead to stroke.  Make sure that any other medical conditions you have, such as atrial fibrillation or atherosclerosis, are managed. What are the warning signs of a stroke? The warning  signs of a stroke can be easily remembered as BEFAST.  B is for balance. Signs include: ? Dizziness. ? Loss of balance or coordination. ? Sudden trouble walking.  E is for eyes. Signs include: ? A sudden change in vision. ? Trouble seeing.  F is for face. Signs include: ? Sudden weakness or numbness of the face. ? The face or eyelid drooping to one side.  A is for arms. Signs include: ? Sudden weakness or numbness of the arm, usually on one side of the body.  S is for speech. Signs include: ? Trouble speaking (aphasia). ? Trouble understanding.  T is for time. ? These symptoms may represent a serious problem that is an emergency. Do not wait to see if the symptoms will go away. Get medical help right away. Call your local emergency services (911 in the [place] Do not drive yourself to the  hospital.  Other signs of stroke may include: ? A sudden, severe headache with no known cause. ? Nausea or vomiting. ? Seizure. Where to find more information For more information, visit:  American Stroke Association: www.strokeassociation.org  National Stroke Association: www.stroke.org Summary  You can prevent a stroke by eating healthy, exercising, not smoking, limiting alcohol intake, and managing any medical conditions you may have.  Do not use any products that contain nicotine or tobacco, such as cigarettes and e-cigarettes. If you need help quitting, ask your health care provider. It may also be helpful to avoid exposure to secondhand smoke.  Remember BEFAST for warning signs of stroke. Get help right away if you or a loved one has any of these signs. This information is not intended to replace advice given to you by your health care provider. Make sure you discuss any questions you have with your health care provider. Document Revised: 10/11/2017 Document Reviewed: 12/04/2016 Elsevier Patient Education  2020 [employer].

## 2020-06-07 ENCOUNTER — Telehealth: Payer: Self-pay | Admitting: Neurology

## 2020-06-07 LAB — DEMENTIA PANEL
Homocysteine: 13.2 umol/L (ref 0.0–21.3)
RPR Ser Ql: NONREACTIVE
TSH: 1.93 u[IU]/mL (ref 0.450–4.500)
Vitamin B-12: 1728 pg/mL — ABNORMAL HIGH (ref 232–1245)

## 2020-06-07 NOTE — Telephone Encounter (Signed)
Pt is needing to speak to the RN about a mix up with his medications. He is unclear of the instructions that were given to him in his appt yesterday. Please advise.

## 2020-06-07 NOTE — Telephone Encounter (Signed)
Spoke to pt, went over Dr Clydene Fake AVS plan from yesterday's visit All questions answered pt voiced understanding

## 2020-06-09 ENCOUNTER — Telehealth: Payer: Self-pay

## 2020-06-09 DIAGNOSIS — E7800 Pure hypercholesterolemia, unspecified: Secondary | ICD-10-CM | POA: Diagnosis not present

## 2020-06-09 DIAGNOSIS — E1169 Type 2 diabetes mellitus with other specified complication: Secondary | ICD-10-CM | POA: Diagnosis not present

## 2020-06-09 DIAGNOSIS — I679 Cerebrovascular disease, unspecified: Secondary | ICD-10-CM | POA: Diagnosis not present

## 2020-06-09 NOTE — Telephone Encounter (Signed)
Pt verified by name and DOB,  normal results given per provider, pt voiced understanding all question answered. °

## 2020-06-09 NOTE — Progress Notes (Signed)
Kindly inform the patient that lab work for reversible causes of memory loss was all normal

## 2020-06-09 NOTE — Telephone Encounter (Signed)
-----   Message from Garvin Fila, MD sent at 06/09/2020  9:06 AM EDT ----- Mitchell Heir inform the patient that lab work for reversible causes of memory loss was all normal

## 2020-06-24 ENCOUNTER — Ambulatory Visit: Payer: PPO | Admitting: Neurology

## 2020-06-28 DIAGNOSIS — H2513 Age-related nuclear cataract, bilateral: Secondary | ICD-10-CM | POA: Diagnosis not present

## 2020-06-28 DIAGNOSIS — H353131 Nonexudative age-related macular degeneration, bilateral, early dry stage: Secondary | ICD-10-CM | POA: Diagnosis not present

## 2020-06-28 DIAGNOSIS — H02831 Dermatochalasis of right upper eyelid: Secondary | ICD-10-CM | POA: Diagnosis not present

## 2020-06-28 DIAGNOSIS — H02834 Dermatochalasis of left upper eyelid: Secondary | ICD-10-CM | POA: Diagnosis not present

## 2020-06-28 DIAGNOSIS — H43813 Vitreous degeneration, bilateral: Secondary | ICD-10-CM | POA: Diagnosis not present

## 2020-06-28 DIAGNOSIS — E119 Type 2 diabetes mellitus without complications: Secondary | ICD-10-CM | POA: Diagnosis not present

## 2020-07-04 ENCOUNTER — Ambulatory Visit: Payer: PPO | Admitting: Neurology

## 2020-07-04 DIAGNOSIS — R410 Disorientation, unspecified: Secondary | ICD-10-CM | POA: Diagnosis not present

## 2020-07-04 DIAGNOSIS — R413 Other amnesia: Secondary | ICD-10-CM

## 2020-07-04 DIAGNOSIS — Z8673 Personal history of transient ischemic attack (TIA), and cerebral infarction without residual deficits: Secondary | ICD-10-CM

## 2020-07-04 DIAGNOSIS — I6930 Unspecified sequelae of cerebral infarction: Secondary | ICD-10-CM

## 2020-07-20 ENCOUNTER — Telehealth: Payer: Self-pay

## 2020-07-20 NOTE — Telephone Encounter (Signed)
I called pt. I advised him that his EEG was normal. Pt verbalized understanding of results. Pt had no questions at this time but was encouraged to call back if questions arise.

## 2020-07-20 NOTE — Progress Notes (Signed)
Kindly inform the patient that EEG study was normal

## 2020-07-20 NOTE — Telephone Encounter (Signed)
-----   Message from Garvin Fila, MD sent at 07/20/2020  8:43 AM EDT ----- Brian Fitzpatrick inform the patient that EEG study was normal

## 2020-09-19 ENCOUNTER — Ambulatory Visit
Admission: RE | Admit: 2020-09-19 | Discharge: 2020-09-19 | Disposition: A | Discharge status: Home / Self Care | Payer: PPO | Source: Ambulatory Visit | Attending: Internal Medicine | Admitting: Internal Medicine

## 2020-09-19 ENCOUNTER — Other Ambulatory Visit: Payer: Self-pay | Admitting: Internal Medicine

## 2020-09-19 DIAGNOSIS — Q766 Other congenital malformations of ribs: Secondary | ICD-10-CM

## 2020-09-19 DIAGNOSIS — I700 Atherosclerosis of aorta: Secondary | ICD-10-CM | POA: Diagnosis not present

## 2020-10-25 DIAGNOSIS — M5412 Radiculopathy, cervical region: Secondary | ICD-10-CM | POA: Diagnosis not present

## 2020-12-08 IMAGING — US US CAROTID DUPLEX BILAT
1 series · 13 of 24 positions shown · non-contrast
Comparison: None.

CLINICAL DATA: TIA symptoms

EXAM:
BILATERAL CAROTID DUPLEX ULTRASOUND
TECHNIQUE: Gray scale imaging, color Doppler and duplex ultrasound were
performed of bilateral carotid and vertebral arteries in the neck.

[Series 1: us carotid duplex bilat · 0.06mm/px · 13 of 49 slices shown]
[im 1/49]
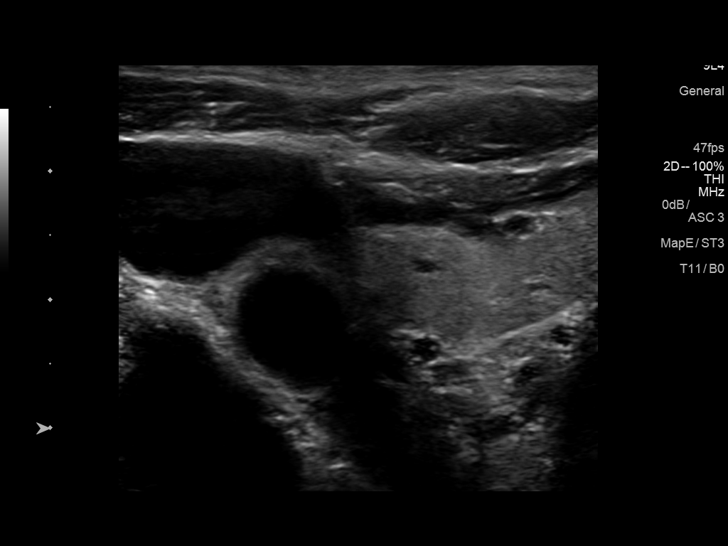
[im 5/49]
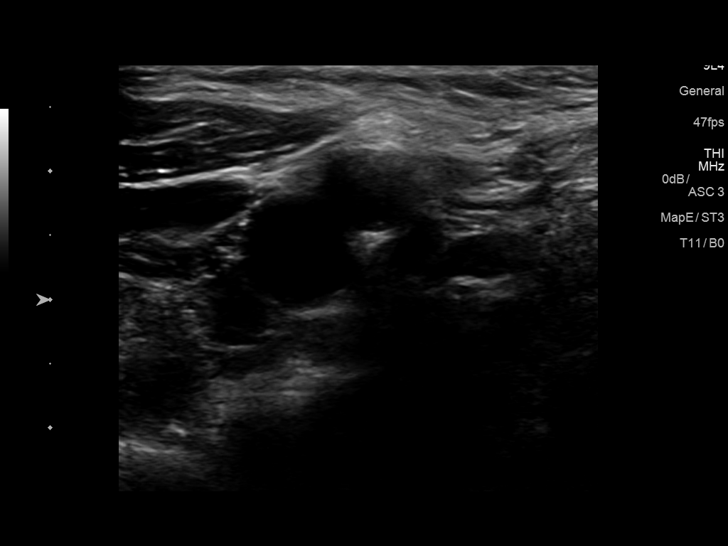
[im 9/49]
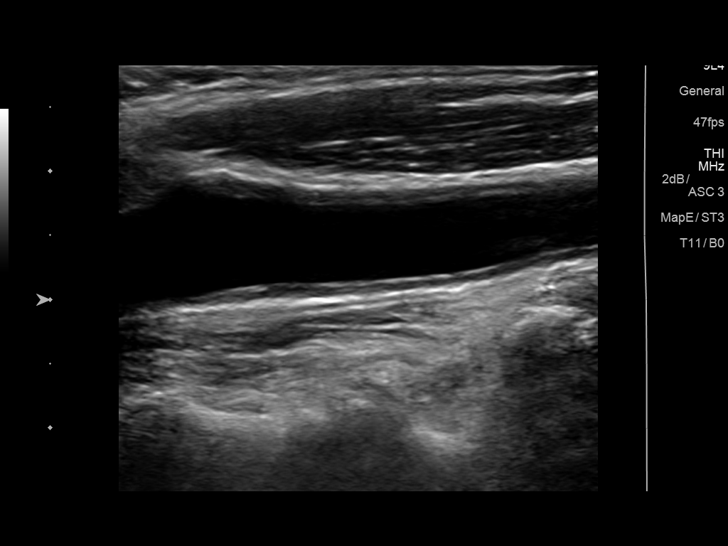
[im 13/49]
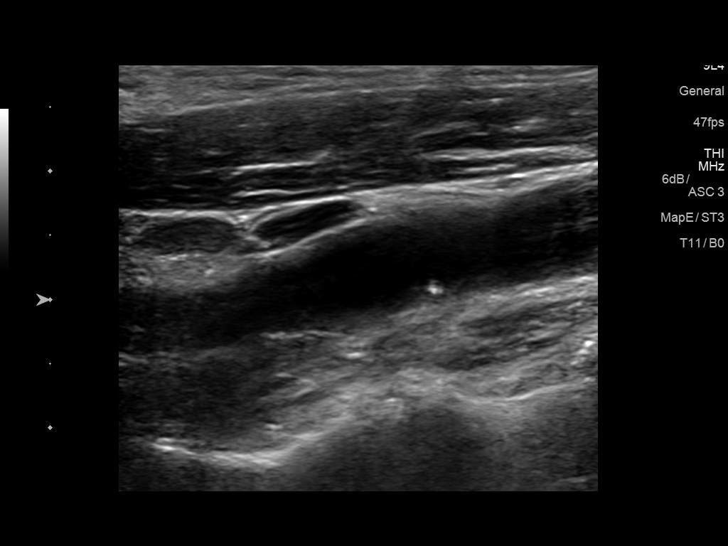
[im 17/49]
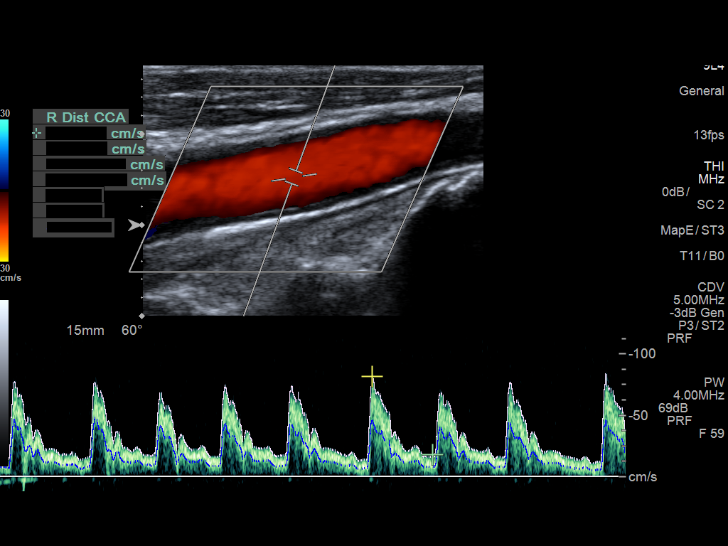
[im 21/49]
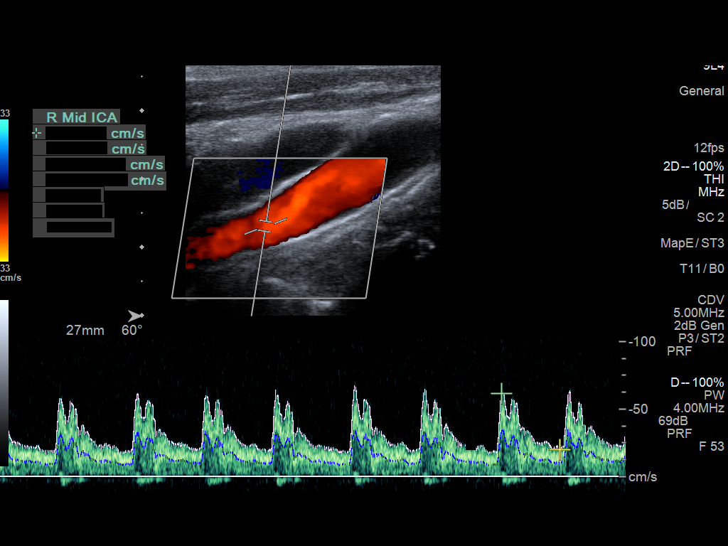
[im 26/49]
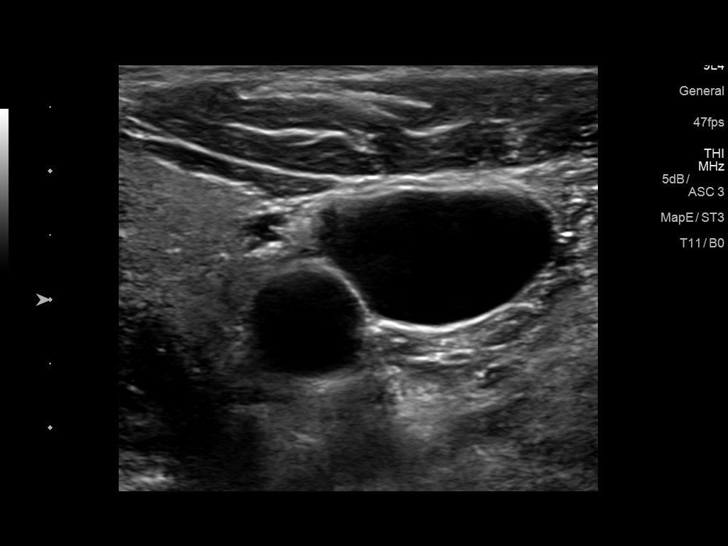
[im 28/49]
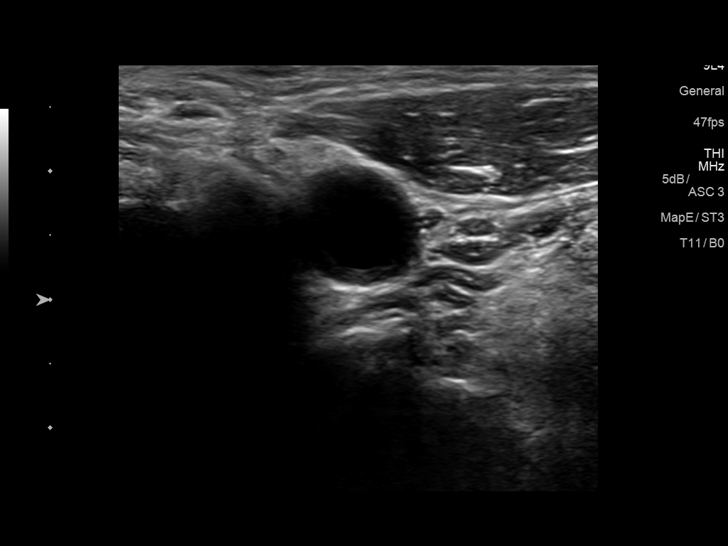
[im 32/49]
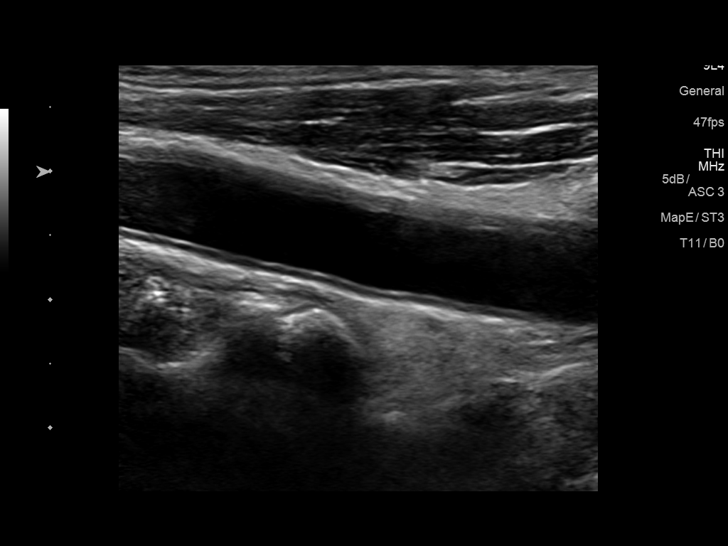
[im 36/49]
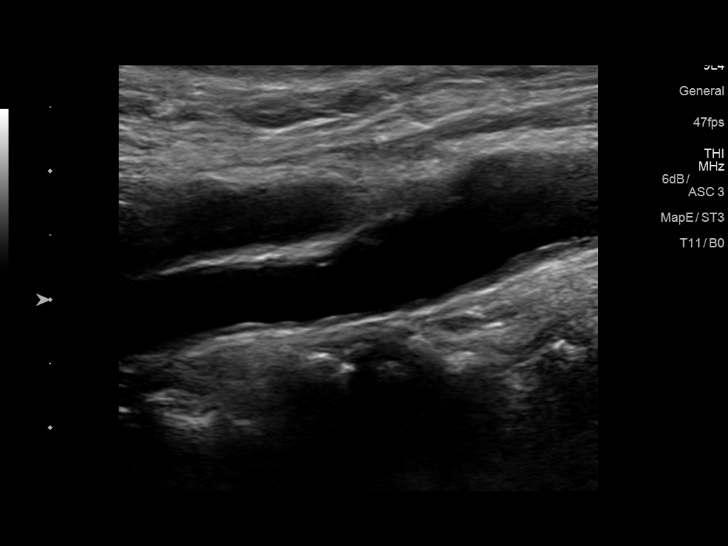
[im 40/49]
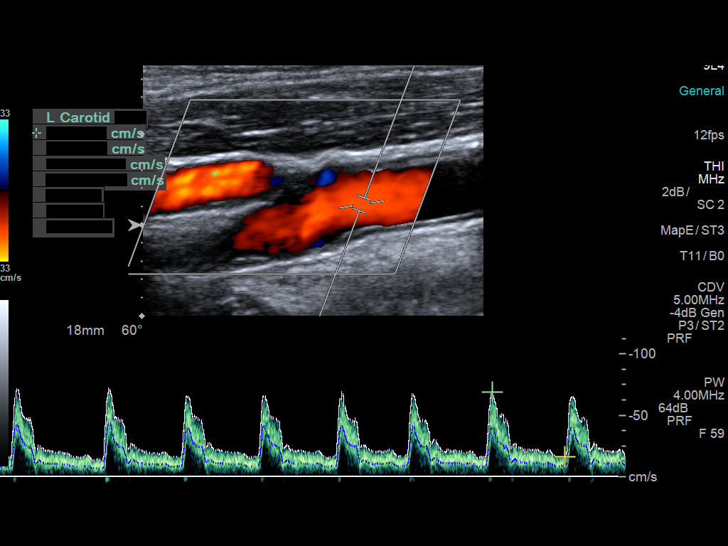
[im 44/49]
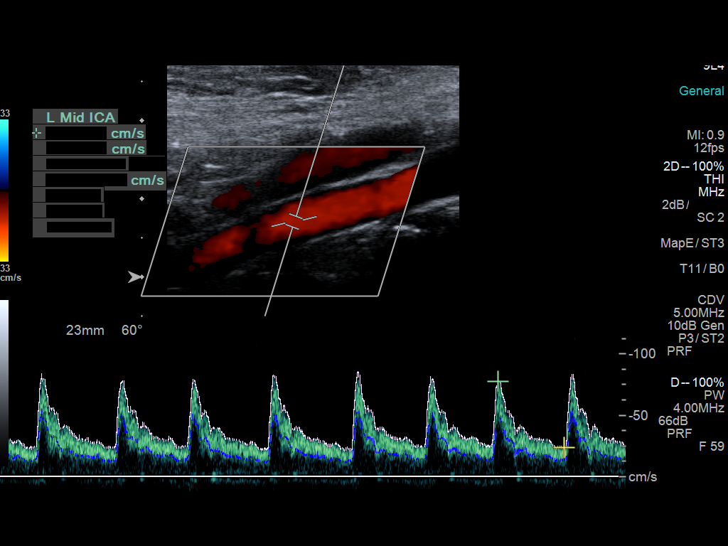
[im 49/49]
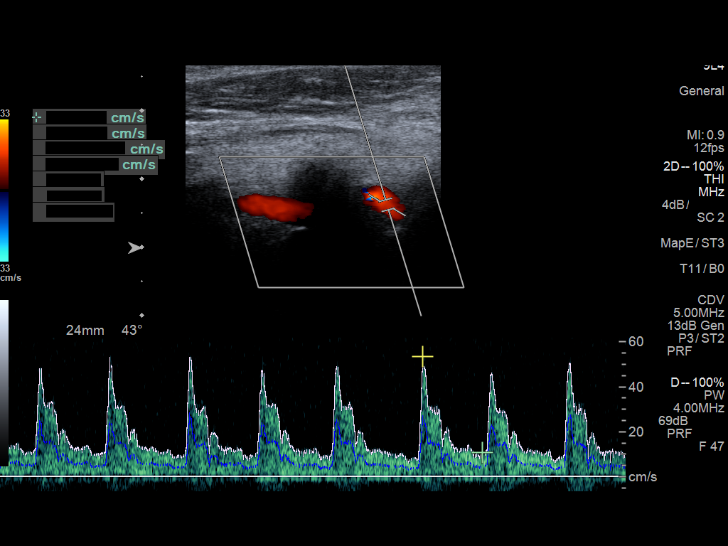

[13 of 24 positions shown; findings below may reference images not displayed]

FINDINGS: Criteria: Quantification of carotid stenosis is based on velocity
parameters that correlate the residual internal carotid diameter
with NASCET-based stenosis levels, using the diameter of the distal
internal carotid lumen as the denominator for stenosis measurement.

The following velocity measurements were obtained:

RIGHT

ICA: 77/13 cm/sec

CCA: 101/17 cm/sec

SYSTOLIC ICA/CCA RATIO:

ECA: 124 cm/sec

LEFT

ICA: 78/24 cm/sec

CCA: 95/19 cm/sec

SYSTOLIC ICA/CCA RATIO:

ECA: 130 cm/sec

RIGHT CAROTID ARTERY: Diffuse carotid intimal thickening and minor
atherosclerotic plaque formation. No hemodynamically significant
right ICA stenosis, velocity elevation, or turbulent flow. Degree of
narrowing less than 50% by ultrasound criteria.

RIGHT VERTEBRAL ARTERY:  Antegrade

LEFT CAROTID ARTERY: Similar diffuse intimal thickening and mild
atherosclerotic plaque formation. No hemodynamically significant
left ICA stenosis, velocity elevation, turbulent flow. Degree of
narrowing also less than 50% by ultrasound criteria.

LEFT VERTEBRAL ARTERY:  Antegrade

Upper extremity blood pressures: RIGHT: 149/76 LEFT: 138/79
IMPRESSION: Mild bilateral carotid atherosclerosis. No hemodynamically
significant ICA stenosis. Degree of narrowing less than 50%
bilaterally by ultrasound criteria.

Patent antegrade vertebral flow bilaterally

## 2020-12-21 DIAGNOSIS — M13841 Other specified arthritis, right hand: Secondary | ICD-10-CM | POA: Diagnosis not present

## 2020-12-21 DIAGNOSIS — M79641 Pain in right hand: Secondary | ICD-10-CM | POA: Insufficient documentation

## 2020-12-21 DIAGNOSIS — M25531 Pain in right wrist: Secondary | ICD-10-CM | POA: Diagnosis not present

## 2020-12-21 DIAGNOSIS — M654 Radial styloid tenosynovitis [de Quervain]: Secondary | ICD-10-CM | POA: Insufficient documentation

## 2020-12-21 DIAGNOSIS — M19031 Primary osteoarthritis, right wrist: Secondary | ICD-10-CM | POA: Diagnosis not present

## 2020-12-26 DIAGNOSIS — H02834 Dermatochalasis of left upper eyelid: Secondary | ICD-10-CM | POA: Diagnosis not present

## 2020-12-26 DIAGNOSIS — H43813 Vitreous degeneration, bilateral: Secondary | ICD-10-CM | POA: Diagnosis not present

## 2020-12-26 DIAGNOSIS — H2513 Age-related nuclear cataract, bilateral: Secondary | ICD-10-CM | POA: Diagnosis not present

## 2020-12-26 DIAGNOSIS — H353131 Nonexudative age-related macular degeneration, bilateral, early dry stage: Secondary | ICD-10-CM | POA: Diagnosis not present

## 2020-12-26 DIAGNOSIS — H02831 Dermatochalasis of right upper eyelid: Secondary | ICD-10-CM | POA: Diagnosis not present

## 2020-12-26 DIAGNOSIS — E119 Type 2 diabetes mellitus without complications: Secondary | ICD-10-CM | POA: Diagnosis not present

## 2021-01-10 DIAGNOSIS — L570 Actinic keratosis: Secondary | ICD-10-CM | POA: Diagnosis not present

## 2021-01-10 DIAGNOSIS — D2272 Melanocytic nevi of left lower limb, including hip: Secondary | ICD-10-CM | POA: Diagnosis not present

## 2021-01-10 DIAGNOSIS — L814 Other melanin hyperpigmentation: Secondary | ICD-10-CM | POA: Diagnosis not present

## 2021-01-10 DIAGNOSIS — L821 Other seborrheic keratosis: Secondary | ICD-10-CM | POA: Diagnosis not present

## 2021-01-10 DIAGNOSIS — D2271 Melanocytic nevi of right lower limb, including hip: Secondary | ICD-10-CM | POA: Diagnosis not present

## 2021-01-10 DIAGNOSIS — L720 Epidermal cyst: Secondary | ICD-10-CM | POA: Diagnosis not present

## 2021-02-13 ENCOUNTER — Other Ambulatory Visit: Payer: Self-pay

## 2021-02-13 ENCOUNTER — Ambulatory Visit: Payer: PPO | Admitting: Neurology

## 2021-02-13 ENCOUNTER — Encounter: Payer: Self-pay | Admitting: Neurology

## 2021-02-13 VITALS — BP 123/70 | HR 73 | Ht 68.0 in | Wt 179.2 lb

## 2021-02-13 DIAGNOSIS — G459 Transient cerebral ischemic attack, unspecified: Secondary | ICD-10-CM | POA: Diagnosis not present

## 2021-02-13 DIAGNOSIS — G3184 Mild cognitive impairment, so stated: Secondary | ICD-10-CM | POA: Diagnosis not present

## 2021-02-13 NOTE — Progress Notes (Signed)
[facility] [address] [place]. [place] [address] [phone]       OFFICE FOLLOW UP VISIT NOTE  [name] Date of Birth:  [birth year] Medical Record Number:  [id number]   Referring MD: [facility]  Reason for Referral: Second opinion for stroke and TIA  HPI: Initial visit 06/06/2020: Brian [facility] is a 87 year old pleasant Caucasian male who is accompanied today by his wife for initial consultation visit for second opinion for stroke and TIA.  History is obtained from the patient and his wife and review of electronic medical records and I personally reviewed pertinent imaging films in PACS.  He has past medical history of diabetes, hypertension, hyperlipidemia, TIAs and strokes.  He states he has had 5 strokes over period of a year and a half starting from January 2020.  Initially had an episode of slurred speech, expressive aphasia and headache lasting 10 minutes an MRI scan of the brain in March 2020 showed no acute abnormality but moderate changes of small vessel disease.  MRA showed significant intracranial atherosclerotic disease and carotid ultrasound showed less than 50% stenosis and echocardiogram showed normal ejection fraction.  He is started on aspirin and Lipitor.  He had recurrent similar episode in early April 2020 with language disturbance with mild headache again lasting only 10 minutes at this time he complained of visual field deficit and he is quite confident that its only in the right visual field of the right eye but not in the left eye.  Plavix 75 mg was added to his aspirin and his LDL cholesterol at that time was 73 mg percent and Lipitor was discontinued.  He underwent external cardiac monitoring for 5 days which showed no evidence of atrial fibrillation.  He subsequently saw Dr. Katy Fitzpatrick ophthalmologist in December 2020 for recurrent episodes of visual disturbances which were similar to the one he had experienced in April 2020.  ESR and C-reactive protein were  normal.  His most recent episode was in January 2021 when he had sudden onset of right eye visual field deficits in the temporal field with mild headaches as well as some confusion and mild memory difficulties.  This time his aspirin dose was increased to 325 and Plavix 75 mg was continued.  Patient was previously seen by Dr. Metta [facility] and most recently wanted a second opinion and saw Dr. Evelena [facility] in our office on 04/08/2020 and has been referred to me for my opinion.  MRI scan of the brain on 12/07/2019 shows subacute left posterior cerebral artery infarct involving left occipital pole with moderate changes of small vessel disease.  CT angiogram of the brain on 11/25/2019 showed high-grade P1 and P2 stenosis involving bilateral posterior cerebral arteries with irregularities of bilateral M2 middle cerebral artery branches as well.  CT angiogram of the neck showed no significant extracranial stenosis.  Patient denies any known history of migraine headaches or visual disturbances in the past until last January.  He is tolerating aspirin 325 mg daily and Plavix 75 mg daily with minimum bruising but no bleeding.  He states his diabetes control has yet quite poor with fasting CBGs ranging from 130-140 range.  His blood pressure seems to be quite well controlled at home though today it is slightly elevated in the office at 146/85.  Patient does complain of mild short-term memory difficulties and often forgets where he keeps his phone.  He feels his memory difficulties are not progressive.  He has been taking a bunch  of vitamins as well as resveratrol for his memory.  He denies any history of significant head injury with loss of consciousness, seizures family history of dementia.  Update 02/13/2021 : He returns for follow-up after last visit in July 2021.  He states he had been doing well until February 2022 when he had a TIA.  He was visiting his wife in the [facility] was admitted with a stroke.  He had  sudden onset of word finding difficulties and blurred vision as well as mild headache.  It lasted only 5 minutes and cleared.  He did not seek medical help at that time.  He remains on Plavix which is tolerating well without bruising or bleeding.  His blood pressures well controlled today it is 123/70.  He remains on Lipitor 10 mg which is tolerating well without muscle aches and pains.  He has not had any recent lipid profile or hemoglobin A1c checked.  He states his memory and cognitive difficulties are unchanged.  They are not getting worse.  He did not take Cerefolin but instead is taking resveratrol to 50 mg daily.  It is not participating regularly in cognitively challenging activities.  He did have EEG on 07/19/2020 which was normal and memory panel labs on 06/06/2020 were also all normal. ROS:   14 system review of systems is positive for memory loss, vision disturbance, bruising and all other systems negative  PMH:  Past Medical History:  Diagnosis Date  . Arthritis   . BPH (benign prostatic hyperplasia)   . CVA (cerebral vascular accident) ([facility])   . Diabetes mellitus    Borderline    DX 2009  . Hypercholesterolemia   . Spinal stenosis   . TIA (transient ischemic attack)   . Vertigo    left ear sets it off/ has "crystals" in it    Social History:  Social History   Socioeconomic History  . Marital status: Married    Spouse name: Brian [facility]  . Number of children: 3  . Years of education: college  . Highest education level: Not on file  Occupational History  . Occupation: retired    Comment: Retired  Tobacco Use  . Smoking status: Former Smoker    Types: Cigarettes  . Smokeless tobacco: Never Used  . Tobacco comment: 45 YRS AGO.  Vaping Use  . Vaping Use: Never used  Substance and Sexual Activity  . Alcohol use: Yes    Alcohol/week: 3.0 standard drinks    Types: 3 Glasses of wine per week    Comment: Occasional  . Drug use: No  . Sexual activity: Not on file  Other  Topics Concern  . 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 atorvastatin (LIPITOR) 10 MG tablet Take 10 mg by mouth daily.    Marland Kitchen b complex vitamins tablet Take 1 tablet by mouth daily.    . Cholecalciferol (VITAMIN D) 2000 units CAPS Take 2,000-4,000 Units by mouth See admin instructions. Take  4000 units in the morning and 2000 units in the evening    . Chromium 1 MG CAPS Take 1 capsule by mouth daily.    . clopidogrel (PLAVIX) 75 MG tablet TK 1 T PO QD    . Coenzyme Q10 (CO Q 10) 100 MG CAPS Take 100 mg by mouth 2 (two) times daily.    Marland Kitchen DHEA 50 MG TABS Take 50 mg by mouth daily.    Marland Kitchen GLUCOSAMINE-MSM-HYALURONIC ACD PO Take 1 tablet by mouth 2 (two) times daily.    . hydrocortisone 2.5 % cream as needed.    Marland Kitchen L-Methylfolate-B12-B6-B2 (CEREFOLIN) 04-12-49-5 MG TABS Take 1 tablet by mouth every morning. 90 tablet 3  . metFORMIN (GLUCOPHAGE) 500 MG tablet Take 500 mg by mouth daily.     . methylPREDNISolone (MEDROL DOSEPAK) 4 MG TBPK tablet as needed.    . milk thistle 175 MG tablet Take 175 mg by mouth 2 (two) times daily.    . Multiple Vitamins-Minerals (OCUVITE-LUTEIN PO) Take 1 tablet by mouth daily.    Letta Pate VERIO test strip FOR USE WHEN CHECKING BLOOD GLUCOSE ONCE A DAY    . OVER THE COUNTER MEDICATION Take 1 tablet by mouth 2 (two) times daily. Prostate health supplement    . OVER THE COUNTER MEDICATION Take 560 tablets by mouth 2 (two) times daily. Hawthorne Berry supplement    . Resveratrol 250 MG CAPS Take 250 mg by  mouth daily.    . Selenium 100 MCG CAPS Take 100 mcg by mouth daily.    Marland Kitchen Specialty Vitamins Products (BRAIN) TABS Take 1 tablet by mouth 2 (two) times daily.    . Taurine 1000 MG CAPS Take 1,000 mg by mouth daily.    . Turmeric Curcumin 500 MG CAPS Take 500-1,000 mg by mouth See admin instructions. Take 1000 mg in the morning and 500 in the evening    . vitamin C (ASCORBIC ACID) 500 MG tablet Take 1,000 mg by mouth 2 (two) times daily.      No current facility-administered medications on file prior to visit.    Allergies:  No Known Allergies  Physical Exam General: well developed, well nourished elderly Caucasian male, seated, in no evident distress Head: head normocephalic and atraumatic.   Neck: supple with no carotid or supraclavicular bruits Cardiovascular: regular rate and rhythm, no murmurs Musculoskeletal: no deformity Skin:  no rash/petichiae Vascular:  Normal pulses all extremities  Neurologic Exam Mental Status: Awake and fully alert. Oriented to place and time. Recent and remote memory intact. Attention span, concentration and fund of knowledge appropriate. Mood and affect appropriate.  Diminished recall 2//3.  Able to name only 13 animals which can walk on 4 legs.  Able to copy intersecting pentagons well.  Clock drawing 3/4. Cranial Nerves: Fundoscopic exam reveals sharp disc margins. Pupils equal, briskly reactive to light. Extraocular movements full without nystagmus. Visual fields full to confrontation mostly except possibly slight peripheral restriction of vision in the right eye temporal field only.Marland Kitchen Hearing intact. Facial sensation intact. Face, tongue, palate moves normally and symmetrically.  Motor: Normal bulk and tone. Normal strength in all tested extremity muscles. Sensory.: intact to touch , pinprick , position and vibratory sensation.  Coordination: Rapid alternating movements normal in all extremities. Finger-to-nose and heel-to-shin performed accurately  bilaterally. Gait and Station: Arises from chair without difficulty. Stance is normal. Gait demonstrates normal stride length and balance . Able to heel, toe and tandem walk with mild difficulty.  Reflexes: 1+ and  symmetric. Toes downgoing.     ASSESSMENT: 87 year old Caucasian male with recurrent transient episodes of monocular right temporal field vision loss and vision disturbance with and without transient speech disturbance possibly TIAs .  Left occipital infarct in January 2021 likely due to intracranial atherosclerosis.  Multiple vascular risk factors of intracranial atherosclerosis, hyperlipidemia, diabetes hypertension.  He also has memory difficulties due to mild cognitive impairment.  Transient episode of expressive aphasia in February 2022 likely left hemispheric TIA.  He also has mild cognitive impairment which is stable   PLAN: I had a long d/w patient about his recent stroke, risk for recurrent stroke/TIAs, personally independently reviewed imaging studies and stroke evaluation results and answered questions.Continue clopidogrel 75 mg daily  for secondary stroke prevention and maintain strict control of hypertension with blood pressure goal below 130/90, diabetes with hemoglobin A1c goal below 6.5% and lipids with LDL cholesterol goal below 70 mg/dL. I also advised the patient to eat a healthy diet with plenty of whole grains, cereals, fruits and vegetables, exercise regularly and maintain ideal body weight . Check MRI brain and MRAs brain and neck given recent TIA episode and lipid panel and HbA1c.Continue reservetarol for memory impairment and increase particpation in cognitively challenging activities daiy.Followup in the future with my nurse practitioner Janett Billow in 3 months or call earlier if needed. We also discussed memory compensation strategies greater than 50% time during this 25 -minute  visit was spent on counseling and coordination of care about his episodes of TIAs, left  occipital stroke, mild cognitive impairment and memory loss and answering questions . Antony Contras, MD  [facility] [address] [place], [place] [address]  Phone [phone] Fax [phone] Note: This document was prepared with digital dictation and possible smart phrase technology. Any transcriptional errors that result from this process are unintentional.

## 2021-02-13 NOTE — Patient Instructions (Signed)
I had a long d/w patient about his recent stroke, risk for recurrent stroke/TIAs, personally independently reviewed imaging studies and stroke evaluation results and answered questions.Continue clopidogrel 75 mg daily  for secondary stroke prevention and maintain strict control of hypertension with blood pressure goal below 130/90, diabetes with hemoglobin A1c goal below 6.5% and lipids with LDL cholesterol goal below 70 mg/dL. I also advised the patient to eat a healthy diet with plenty of whole grains, cereals, fruits and vegetables, exercise regularly and maintain ideal body weight . Check MRI brain and MRAs brain and neck given recent TIA episode and lipid panel and HbA1c.Continue reservetarol for memory impairment and increase particpation in cognitively challenging activities daiy.Followup in the future with my nurse practitioner Janett Billow in 3 months or call earlier if needed.

## 2021-02-14 ENCOUNTER — Telehealth: Payer: Self-pay | Admitting: Neurology

## 2021-02-14 LAB — LIPID PANEL
Chol/HDL Ratio: 2.9 ratio (ref 0.0–5.0)
Cholesterol, Total: 126 mg/dL (ref 100–199)
HDL: 43 mg/dL (ref >39)
LDL Chol Calc (NIH): 54 mg/dL (ref 0–99)
Triglycerides: 176 mg/dL — ABNORMAL HIGH (ref 0–149)
VLDL Cholesterol Cal: 29 mg/dL (ref 5–40)

## 2021-02-14 LAB — HEMOGLOBIN A1C
Est. average glucose Bld gHb Est-mCnc: 160 mg/dL
Hgb A1c MFr Bld: 7.2 % — ABNORMAL HIGH (ref 4.8–5.6)

## 2021-02-14 NOTE — Telephone Encounter (Signed)
health team order sent to GI. No auth they will reach out to the patient to schedule.  

## 2021-02-20 NOTE — Progress Notes (Signed)
Kindly inform the patient that cholesterol profile was quite satisfactory except triglycerides are slightly high.  He may see his primary care physician to discuss possible treatment options.  Screening test for diabetes was slightly high at 7.2 and he needs to discuss treatment options with primary care physician  for this as well

## 2021-02-23 ENCOUNTER — Telehealth: Payer: Self-pay | Admitting: *Deleted

## 2021-02-23 NOTE — Telephone Encounter (Signed)
Spoke with patient and informed him  that cholesterol profile was quite satisfactory except triglycerides are slightly high. He may see his primary care physician to discuss possible treatment options. Screening test for diabetes was slightly high at 7.2 and he needs to discuss treatment options with primary care physician for this as well.  Patient verbalized understanding, appreciation.

## 2021-03-06 ENCOUNTER — Inpatient Hospital Stay: Admission: RE | Admit: 2021-03-06 | Payer: PPO | Source: Ambulatory Visit

## 2021-03-06 ENCOUNTER — Other Ambulatory Visit: Payer: PPO

## 2021-03-07 ENCOUNTER — Ambulatory Visit
Admission: RE | Admit: 2021-03-07 | Discharge: 2021-03-07 | Disposition: A | Discharge status: Home / Self Care | Payer: PPO | Source: Ambulatory Visit | Attending: Neurology | Admitting: Neurology

## 2021-03-07 DIAGNOSIS — G459 Transient cerebral ischemic attack, unspecified: Secondary | ICD-10-CM | POA: Diagnosis not present

## 2021-03-07 MED ORDER — GADOBENATE DIMEGLUMINE 529 MG/ML IV SOLN
16.0000 mL | Freq: Once | INTRAVENOUS | Status: AC | PRN
  Administered 2021-03-07: 16 mL via INTRAVENOUS

## 2021-03-15 NOTE — Progress Notes (Signed)
Kindly inform the patient that MRI scan of the brain shows mild changes of age-related hardening of the arteries and shrinkage of the brain.  Nothing to worry about.   MR angiogram of the neck shows no major narrowing of the large vessels in the neck.   MR angiogram of the brain shows only minor narrowing of the blood vessels in the brain without major blockages.

## 2021-03-16 ENCOUNTER — Telehealth: Payer: Self-pay | Admitting: Emergency Medicine

## 2021-03-16 NOTE — Telephone Encounter (Signed)
-----   Message from Garvin Fila, MD sent at 03/15/2021  7:05 AM EDT ----- [facility] inform the patient that MRI scan of the brain shows mild changes of age-related hardening of the arteries and shrinkage of the brain.  Nothing to worry about.   MR angiogram of the neck shows no major narrowing of the large vessels in the neck.   MR angiogram of the brain shows only minor narrowing of the blood vessels in the brain without major blockages.

## 2021-03-16 NOTE — Telephone Encounter (Signed)
Called patient and discussed Dr. Clydene Fake review and findings regarding MRI/MR Angiograms.  Patient requested paper copy, [name] is sending out for patient.  Patient denied further questions, verbalized understanding and expressed appreciation for the phone call.

## 2021-03-21 DIAGNOSIS — E1169 Type 2 diabetes mellitus with other specified complication: Secondary | ICD-10-CM | POA: Diagnosis not present

## 2021-03-21 DIAGNOSIS — Z8673 Personal history of transient ischemic attack (TIA), and cerebral infarction without residual deficits: Secondary | ICD-10-CM | POA: Diagnosis not present

## 2021-03-21 DIAGNOSIS — Z7984 Long term (current) use of oral hypoglycemic drugs: Secondary | ICD-10-CM | POA: Diagnosis not present

## 2021-03-21 DIAGNOSIS — R29898 Other symptoms and signs involving the musculoskeletal system: Secondary | ICD-10-CM | POA: Diagnosis not present

## 2021-03-21 DIAGNOSIS — M79602 Pain in left arm: Secondary | ICD-10-CM | POA: Diagnosis not present

## 2021-05-31 ENCOUNTER — Encounter: Payer: Self-pay | Admitting: Adult Health

## 2021-05-31 ENCOUNTER — Ambulatory Visit: Payer: PPO | Admitting: Adult Health

## 2021-05-31 VITALS — BP 124/64 | HR 62 | Ht 68.0 in | Wt 172.5 lb

## 2021-05-31 DIAGNOSIS — I6930 Unspecified sequelae of cerebral infarction: Secondary | ICD-10-CM | POA: Diagnosis not present

## 2021-05-31 DIAGNOSIS — G459 Transient cerebral ischemic attack, unspecified: Secondary | ICD-10-CM

## 2021-05-31 NOTE — Progress Notes (Signed)
I agree with the above plan 

## 2021-05-31 NOTE — Progress Notes (Signed)
[facility] [address] [place]. [place] [address] [phone]       OFFICE FOLLOW UP VISIT NOTE  Brian Fitzpatrick Date of Birth:  1934/07/08 Medical Record Number:  [id number]   Referring MD: Krista Blue  Reason for Referral: Second opinion for stroke and TIA   Chief Complaint  Patient presents with   Follow-up    Rm 3 with wife- Here for 3 month f/u.  overall doing fairly well.        HPI:   Today, 05/31/2021, Brian Fitzpatrick returns for 79-month stroke/TIA follow-up accompanied by his wife.  Brian Fitzpatrick has been stable from stroke standpoint without new or reoccurring stroke/TIA symptoms.  Brian Fitzpatrick will occasionally experience tension type headaches which resolved without intervention.  Cognition has been stable.  Compliant on Plavix and atorvastatin without associated side effects.  LDL 54 (02/2021).  A1c 7.2 (02/2021). blood pressure today 124/64.  No further concerns at this time.  MR BRAIN 03/07/2021 IMPRESSION: MRI brain (with and without) demonstrating: -Mild atrophy and moderate chronic small vessel ischemic disease. -No acute findings.  MRA head/neck 03/07/2021 IMPRESSION: Right posterior cerebral artery multiple areas of intracranial stenosis proximally and distally.  Left posterior cerebral artery arises mainly from posterior communicating artery has mild distal stenosis. Normal MRA neck    History provided for reference purposes only Update 02/13/2021 Dr. Leonie Man: Brian Fitzpatrick returns for follow-up after last visit in July 2021.  Brian Fitzpatrick states Brian Fitzpatrick had been doing well until February 2022 when Brian Fitzpatrick had a TIA.  Brian Fitzpatrick was visiting his wife in the hospital was admitted with a stroke.  Brian Fitzpatrick had sudden onset of word finding difficulties and blurred vision as well as mild headache.  It lasted only 5 minutes and cleared.  Brian Fitzpatrick did not seek medical help at that time.  Brian Fitzpatrick remains on Plavix which is tolerating well without bruising or bleeding.  His blood pressures well controlled today it is 123/70.  Brian Fitzpatrick remains  on Lipitor 10 mg which is tolerating well without muscle aches and pains.  Brian Fitzpatrick has not had any recent lipid profile or hemoglobin A1c checked.  Brian Fitzpatrick states his memory and cognitive difficulties are unchanged.  They are not getting worse.  Brian Fitzpatrick did not take Cerefolin but instead is taking resveratrol to 50 mg daily.  It is not participating regularly in cognitively challenging activities.  Brian Fitzpatrick did have EEG on 07/19/2020 which was normal and memory panel labs on 06/06/2020 were also all normal.  Initial visit 06/06/2020 Dr. Leonie Man : Brian Fitzpatrick is a 87 year old pleasant Caucasian male who is accompanied today by his wife for initial consultation visit for second opinion for stroke and TIA.  History is obtained from the patient and his wife and review of electronic medical records and I personally reviewed pertinent imaging films in PACS.  Brian Fitzpatrick has past medical history of diabetes, hypertension, hyperlipidemia, TIAs and strokes.  Brian Fitzpatrick states Brian Fitzpatrick has had 5 strokes over period of a year and a half starting from January 2020.  Initially had an episode of slurred speech, expressive aphasia and headache lasting 10 minutes an MRI scan of the brain in March 2020 showed no acute abnormality but moderate changes of small vessel disease.  MRA showed significant intracranial atherosclerotic disease and carotid ultrasound showed less than 50% stenosis and echocardiogram showed normal ejection fraction.  Brian Fitzpatrick is started on aspirin and Lipitor.  Brian Fitzpatrick had recurrent similar episode in early April 2020 with language disturbance with mild headache again lasting only 10 minutes at  this time Brian Fitzpatrick complained of visual field deficit and Brian Fitzpatrick is quite confident that its only in the right visual field of the right eye but not in the left eye.  Plavix 75 mg was added to his aspirin and his LDL cholesterol at that time was 73 mg percent and Lipitor was discontinued.  Brian Fitzpatrick underwent external cardiac monitoring for 5 days which showed no evidence of atrial fibrillation.   Brian Fitzpatrick subsequently saw Dr. Katy Fitch ophthalmologist in December 2020 for recurrent episodes of visual disturbances which were similar to the one Brian Fitzpatrick had experienced in April 2020.  ESR and C-reactive protein were normal.  His most recent episode was in January 2021 when Brian Fitzpatrick had sudden onset of right eye visual field deficits in the temporal field with mild headaches as well as some confusion and mild memory difficulties.  This time his aspirin dose was increased to 325 and Plavix 75 mg was continued.  Patient was previously seen by Dr. Metta Clines from [facility] neurology and most recently wanted a second opinion and saw Dr. Evelena Leyden in our office on 04/08/2020 and has been referred to me for my opinion.  MRI scan of the brain on 12/07/2019 shows subacute left posterior cerebral artery infarct involving left occipital pole with moderate changes of small vessel disease.  CT angiogram of the brain on 11/25/2019 showed high-grade P1 and P2 stenosis involving bilateral posterior cerebral arteries with irregularities of bilateral M2 middle cerebral artery branches as well.  CT angiogram of the neck showed no significant extracranial stenosis.  Patient denies any known history of migraine headaches or visual disturbances in the past until last January.  Brian Fitzpatrick is tolerating aspirin 325 mg daily and Plavix 75 mg daily with minimum bruising but no bleeding.  Brian Fitzpatrick states his diabetes control has yet quite poor with fasting CBGs ranging from 130-140 range.  His blood pressure seems to be quite well controlled at home though today it is slightly elevated in the office at 146/85.  Patient does complain of mild short-term memory difficulties and often forgets where Brian Fitzpatrick keeps his phone.  Brian Fitzpatrick feels his memory difficulties are not progressive.  Brian Fitzpatrick has been taking a bunch of vitamins as well as resveratrol for his memory.  Brian Fitzpatrick denies any history of significant head injury with loss of consciousness, seizures family history of dementia.   ROS:   14 system  review of systems is positive for those listed in HPI and all other systems negative  PMH:  Past Medical History:  Diagnosis Date   Arthritis    BPH (benign prostatic hyperplasia)    CVA (cerebral vascular accident) ([place])    Diabetes mellitus    Borderline    DX 2009   Hypercholesterolemia    Spinal stenosis    TIA (transient ischemic attack)    Vertigo    left ear sets it off/ has "crystals" in it    Social History:  Social History   Socioeconomic History   Marital status: Married    Spouse name: MARY ANN [facility]   Number of children: 3   Years of education: college   Highest education level: Not on file  Occupational History   Occupation: retired    Comment: Retired  Tobacco Use   Smoking status: Former    Types: Cigarettes   Smokeless tobacco: Never   Tobacco comments:    [place].  Vaping Use   Vaping Use: Never used  Substance and Sexual Activity   Alcohol use: Yes  Alcohol/week: 3.0 standard drinks    Types: 3 Glasses of wine per week    Comment: Occasional   Drug use: No   Sexual activity: Not on file  Other Topics Concern   Not on file  Social History Narrative   Lives with wife, is now caregiver for wife   Right Handed   Drinks 1 cups caffeine daily   Social Determinants of Health   Financial Resource Strain: Not on file  Food Insecurity: Not on file  Transportation Needs: Not on file  Physical Activity: Not on file  Stress: Not on file  Social Connections: Not on file  Intimate Partner Violence: Not on file    Medications:   Current Outpatient Medications on File Prior to Visit  Medication Sig Dispense Refill   atorvastatin (LIPITOR) 10 MG tablet Take 10 mg by mouth daily.     b complex vitamins tablet Take 1 tablet by mouth daily.     Cholecalciferol (VITAMIN D) 2000 units CAPS Take 2,000-4,000 Units by mouth See admin instructions. Take 4000 units in the morning and 2000 units in the evening     Chromium 1 MG CAPS Take 1 capsule by  mouth daily.     clopidogrel (PLAVIX) 75 MG tablet TK 1 T PO QD     Coenzyme Q10 (CO Q 10) 100 MG CAPS Take 100 mg by mouth 2 (two) times daily.     DHEA 50 MG TABS Take 50 mg by mouth daily.     GLUCOSAMINE-MSM-HYALURONIC ACD PO Take 1 tablet by mouth 2 (two) times daily.     hydrocortisone 2.5 % cream as needed.     L-Methylfolate-B12-B6-B2 (CEREFOLIN) 04-12-49-5 MG TABS Take 1 tablet by mouth every morning. 90 tablet 3   metFORMIN (GLUCOPHAGE) 500 MG tablet Take 500 mg by mouth daily.      methylPREDNISolone (MEDROL DOSEPAK) 4 MG TBPK tablet as needed.     milk thistle 175 MG tablet Take 175 mg by mouth 2 (two) times daily.     Multiple Vitamins-Minerals (OCUVITE-LUTEIN PO) Take 1 tablet by mouth daily.     ONETOUCH VERIO test strip FOR USE WHEN CHECKING BLOOD GLUCOSE ONCE A DAY     OVER THE COUNTER MEDICATION Take 1 tablet by mouth 2 (two) times daily. Prostate health supplement     OVER THE COUNTER MEDICATION Take 560 tablets by mouth 2 (two) times daily. [place] Berry supplement     Resveratrol 250 MG CAPS Take 250 mg by mouth daily.     Selenium 100 MCG CAPS Take 100 mcg by mouth daily.     Specialty Vitamins Products (BRAIN) TABS Take 1 tablet by mouth 2 (two) times daily.     Taurine 1000 MG CAPS Take 1,000 mg by mouth daily.     Turmeric Curcumin 500 MG CAPS Take 500-1,000 mg by mouth See admin instructions. Take 1000 mg in the morning and 500 in the evening     vitamin C (ASCORBIC ACID) 500 MG tablet Take 1,000 mg by mouth 2 (two) times daily.      No current facility-administered medications on file prior to visit.    Allergies:  No Known Allergies  Physical Exam Today's Vitals   05/31/21 1547  BP: 124/64  Pulse: 62  SpO2: 98%  Weight: 172 lb 8 oz (78.2 kg)  Height: $Remove'5\' 8"'CviNerq$  (1.727 m)   Body mass index is 26.23 kg/m.   General: well developed, well nourished pleasant elderly Caucasian male, seated, in no  evident distress Head: head normocephalic and atraumatic.    Neck: supple with no carotid or supraclavicular bruits Cardiovascular: regular rate and rhythm, no murmurs Musculoskeletal: no deformity Skin:  no rash/petichiae Vascular:  Normal pulses all extremities  Neurologic Exam Mental Status: Awake and fully alert. Oriented to place and time. Recent and remote memory intact. Attention span, concentration and fund of knowledge appropriate. Mood and affect appropriate.  Cranial Nerves: Pupils equal, briskly reactive to light. Extraocular movements full without nystagmus. Visual fields full to confrontation. Hearing intact. Facial sensation intact. Face, tongue, palate moves normally and symmetrically.  Motor: Normal bulk and tone. Normal strength in all tested extremity muscles. Sensory.: intact to touch , pinprick , position and vibratory sensation.  Coordination: Rapid alternating movements normal in all extremities. Finger-to-nose and heel-to-shin performed accurately bilaterally. Gait and Station: Arises from chair without difficulty. Stance is normal. Gait demonstrates normal stride length and balance . Able to heel, toe and tandem walk with mild difficulty.  Reflexes: 1+ and symmetric. Toes downgoing.     ASSESSMENT/PLAN: 87 year old Caucasian male with recurrent transient episodes of monocular right temporal field vision loss and vision disturbance with and without transient speech disturbance possibly TIAs .  Left occipital infarct in January 2021 likely due to intracranial atherosclerosis.  Multiple vascular risk factors of intracranial atherosclerosis, hyperlipidemia, diabetes hypertension.  Brian Fitzpatrick also has memory difficulties due to mild cognitive impairment.  Transient episode of expressive aphasia in February 2022 likely left hemispheric TIA.  Brian Fitzpatrick also has mild cognitive impairment which is stable    Continue clopidogrel 75 mg daily and atorvastatin 10 mg daily for secondary stroke prevention and maintain strict control of hypertension with  blood pressure goal below 130/90, diabetes with hemoglobin A1c goal below 6.5% and lipids with LDL cholesterol goal below 70 mg/dL. I also advised the patient to eat a healthy diet with plenty of whole grains, cereals, fruits and vegetables, exercise regularly and maintain ideal body weight .    Follow-up in 6 months or call earlier if needed    CC:  [facility] provider: Dr. Wynetta Fines, Jenny Reichmann, MD   I spent 26 minutes of face-to-face and non-face-to-face time with patient and wife.  This included previsit chart review, lab review, study review, electronic health record documentation, patient education and discussion regarding history of TIAs and prior stroke, secondary stroke prevention measures and aggressive stroke risk factor management and answered all other questions to patient satisfaction  Frann Rider, [facility]  [facility] [address] [place], [place] [address]  Phone [phone] Fax [phone] Note: This document was prepared with digital dictation and possible smart phrase technology. Any transcriptional errors that result from this process are unintentional.

## 2021-05-31 NOTE — Patient Instructions (Signed)
Continue clopidogrel 75 mg daily  and atorvastatin  for secondary stroke prevention  Continue to follow up with PCP regarding cholesterol and blood pressure management  Maintain strict control of hypertension with blood pressure goal below 130/90 and cholesterol with LDL cholesterol (bad cholesterol) goal below 70 mg/dL.       Followup in the future with me in 6 months or call earlier if needed       Thank you for coming to see [place] at [facility]. I hope we have been able to provide you high quality care today.  You may receive a patient satisfaction survey over the next few weeks. We would appreciate your feedback and comments so that we may continue to improve ourselves and the health of our patients.

## 2021-06-19 DIAGNOSIS — E119 Type 2 diabetes mellitus without complications: Secondary | ICD-10-CM | POA: Diagnosis not present

## 2021-06-19 DIAGNOSIS — H02834 Dermatochalasis of left upper eyelid: Secondary | ICD-10-CM | POA: Diagnosis not present

## 2021-06-19 DIAGNOSIS — H2513 Age-related nuclear cataract, bilateral: Secondary | ICD-10-CM | POA: Diagnosis not present

## 2021-06-19 DIAGNOSIS — H43813 Vitreous degeneration, bilateral: Secondary | ICD-10-CM | POA: Diagnosis not present

## 2021-06-19 DIAGNOSIS — H353131 Nonexudative age-related macular degeneration, bilateral, early dry stage: Secondary | ICD-10-CM | POA: Diagnosis not present

## 2021-06-19 DIAGNOSIS — G459 Transient cerebral ischemic attack, unspecified: Secondary | ICD-10-CM | POA: Diagnosis not present

## 2021-06-19 DIAGNOSIS — H02831 Dermatochalasis of right upper eyelid: Secondary | ICD-10-CM | POA: Diagnosis not present

## 2021-07-20 DIAGNOSIS — R531 Weakness: Secondary | ICD-10-CM | POA: Diagnosis not present

## 2021-07-20 DIAGNOSIS — E559 Vitamin D deficiency, unspecified: Secondary | ICD-10-CM | POA: Diagnosis not present

## 2021-07-20 DIAGNOSIS — R29898 Other symptoms and signs involving the musculoskeletal system: Secondary | ICD-10-CM | POA: Diagnosis not present

## 2021-07-26 ENCOUNTER — Other Ambulatory Visit: Payer: Self-pay | Admitting: Internal Medicine

## 2021-07-26 DIAGNOSIS — R29898 Other symptoms and signs involving the musculoskeletal system: Secondary | ICD-10-CM

## 2021-07-31 ENCOUNTER — Other Ambulatory Visit: Payer: PPO

## 2021-08-07 ENCOUNTER — Emergency Department (HOSPITAL_BASED_OUTPATIENT_CLINIC_OR_DEPARTMENT_OTHER): Payer: PPO | Admitting: Radiology

## 2021-08-07 ENCOUNTER — Encounter (HOSPITAL_BASED_OUTPATIENT_CLINIC_OR_DEPARTMENT_OTHER): Payer: Self-pay | Admitting: *Deleted

## 2021-08-07 ENCOUNTER — Emergency Department (HOSPITAL_BASED_OUTPATIENT_CLINIC_OR_DEPARTMENT_OTHER)
Admission: EM | Admit: 2021-08-07 | Discharge: 2021-08-07 | Discharge status: Against Medical Advice | Payer: PPO | Attending: Emergency Medicine | Admitting: Emergency Medicine

## 2021-08-07 ENCOUNTER — Other Ambulatory Visit: Payer: Self-pay

## 2021-08-07 ENCOUNTER — Telehealth: Payer: Self-pay | Admitting: *Deleted

## 2021-08-07 DIAGNOSIS — R079 Chest pain, unspecified: Secondary | ICD-10-CM | POA: Insufficient documentation

## 2021-08-07 DIAGNOSIS — Z5321 Procedure and treatment not carried out due to patient leaving prior to being seen by health care provider: Secondary | ICD-10-CM | POA: Diagnosis not present

## 2021-08-07 LAB — CBC
HCT: 43.3 % (ref 39.0–52.0)
Hemoglobin: 14.8 g/dL (ref 13.0–17.0)
MCH: 31.4 pg (ref 26.0–34.0)
MCHC: 34.2 g/dL (ref 30.0–36.0)
MCV: 91.9 fL (ref 80.0–100.0)
Platelets: 267 10*3/uL (ref 150–400)
RBC: 4.71 MIL/uL (ref 4.22–5.81)
RDW: 12.6 % (ref 11.5–15.5)
WBC: 6.7 10*3/uL (ref 4.0–10.5)
nRBC: 0 % (ref 0.0–0.2)

## 2021-08-07 LAB — BASIC METABOLIC PANEL
Anion gap: 7 (ref 5–15)
BUN: 27 mg/dL — ABNORMAL HIGH (ref 8–23)
CO2: 29 mmol/L (ref 22–32)
Calcium: 10.5 mg/dL — ABNORMAL HIGH (ref 8.9–10.3)
Chloride: 102 mmol/L (ref 98–111)
Creatinine, Ser: 1.47 mg/dL — ABNORMAL HIGH (ref 0.61–1.24)
GFR, Estimated: 46 mL/min — ABNORMAL LOW (ref >60)
Glucose, Bld: 105 mg/dL — ABNORMAL HIGH (ref 70–99)
Potassium: 4.2 mmol/L (ref 3.5–5.1)
Sodium: 138 mmol/L (ref 135–145)

## 2021-08-07 LAB — TROPONIN I (HIGH SENSITIVITY): Troponin I (High Sensitivity): 6 ng/L (ref <18)

## 2021-08-07 NOTE — Telephone Encounter (Signed)
The patient walked in the office stating that he has been having intermittent chest pain on and off for a few weeks now. He was last seen in the office in 2015 with recommendations to follow up with Dr. Stanford Breed. He has been advised that Dr. Stanford Breed is not here today and that we could make him a new patient appointment. He stated that he had hoped he could be seen today. He has been advised that we did not have any appointments for today (current time was 3:30pm). He has been advised that we could call an ambulance for him but he denied current chest pain. He has also been advised that he could go to the [facility] ED to be seen as well and stated that he probably would. He had left his wife in the car who recently had a stroke. He stated that he feels like the pain could be from stress. He has once again been advised to have this assessed. He declined having a new patient appointment made but stated that he would go to the ED

## 2021-08-07 NOTE — ED Notes (Signed)
Patient was brought back into Examination Room but stated he would like to leave prior to being assessed by the EDP. PIV removed and Patient ambulatory to Home via SELF.

## 2021-08-07 NOTE — ED Triage Notes (Signed)
Intermittent left chest pain since Friday.  Denies n/v.  Patient stated that he might be stressed d/t his wife having a stroke since February.

## 2021-08-10 DIAGNOSIS — K219 Gastro-esophageal reflux disease without esophagitis: Secondary | ICD-10-CM | POA: Diagnosis not present

## 2021-08-10 DIAGNOSIS — I700 Atherosclerosis of aorta: Secondary | ICD-10-CM | POA: Diagnosis not present

## 2021-08-10 DIAGNOSIS — R079 Chest pain, unspecified: Secondary | ICD-10-CM | POA: Diagnosis not present

## 2021-08-25 ENCOUNTER — Other Ambulatory Visit: Payer: Self-pay

## 2021-08-25 ENCOUNTER — Ambulatory Visit
Admission: RE | Admit: 2021-08-25 | Discharge: 2021-08-25 | Disposition: A | Discharge status: Home / Self Care | Payer: PPO | Source: Ambulatory Visit | Attending: Internal Medicine | Admitting: Internal Medicine

## 2021-08-25 DIAGNOSIS — R29898 Other symptoms and signs involving the musculoskeletal system: Secondary | ICD-10-CM

## 2021-08-25 DIAGNOSIS — R531 Weakness: Secondary | ICD-10-CM | POA: Diagnosis not present

## 2021-09-04 DIAGNOSIS — R29898 Other symptoms and signs involving the musculoskeletal system: Secondary | ICD-10-CM | POA: Diagnosis not present

## 2021-09-28 DIAGNOSIS — Z7984 Long term (current) use of oral hypoglycemic drugs: Secondary | ICD-10-CM | POA: Diagnosis not present

## 2021-09-28 DIAGNOSIS — I679 Cerebrovascular disease, unspecified: Secondary | ICD-10-CM | POA: Diagnosis not present

## 2021-09-28 DIAGNOSIS — Z1389 Encounter for screening for other disorder: Secondary | ICD-10-CM | POA: Diagnosis not present

## 2021-09-28 DIAGNOSIS — E1169 Type 2 diabetes mellitus with other specified complication: Secondary | ICD-10-CM | POA: Diagnosis not present

## 2021-09-28 DIAGNOSIS — R29898 Other symptoms and signs involving the musculoskeletal system: Secondary | ICD-10-CM | POA: Diagnosis not present

## 2021-09-28 DIAGNOSIS — Z0000 Encounter for general adult medical examination without abnormal findings: Secondary | ICD-10-CM | POA: Diagnosis not present

## 2021-09-28 DIAGNOSIS — I700 Atherosclerosis of aorta: Secondary | ICD-10-CM | POA: Diagnosis not present

## 2021-09-28 DIAGNOSIS — E7800 Pure hypercholesterolemia, unspecified: Secondary | ICD-10-CM | POA: Diagnosis not present

## 2021-12-13 ENCOUNTER — Encounter: Payer: Self-pay | Admitting: Adult Health

## 2021-12-13 ENCOUNTER — Other Ambulatory Visit: Payer: Self-pay

## 2021-12-13 ENCOUNTER — Ambulatory Visit: Payer: PPO | Admitting: Adult Health

## 2021-12-13 VITALS — BP 136/76 | HR 63 | Ht 68.0 in | Wt 179.0 lb

## 2021-12-13 DIAGNOSIS — G459 Transient cerebral ischemic attack, unspecified: Secondary | ICD-10-CM | POA: Diagnosis not present

## 2021-12-13 NOTE — Patient Instructions (Addendum)
Continue clopidogrel 75 mg daily  and Crestor for secondary stroke prevention  Continue to follow up with PCP regarding cholesterol and blood pressure management  Maintain strict control of hypertension with blood pressure goal below 130/90 and cholesterol with LDL cholesterol (bad cholesterol) goal below 70 mg/dL.   Signs of a Stroke? Follow the BEFAST method:  Balance Watch for a sudden loss of balance, trouble with coordination or vertigo Eyes Is there a sudden loss of vision in one or both eyes? Or double vision?  Face: Ask the person to smile. Does one side of the face droop or is it numb?  Arms: Ask the person to raise both arms. Does one arm drift downward? Is there weakness or numbness of a leg? Speech: Ask the person to repeat a simple phrase. Does the speech sound slurred/strange? Is the person confused ? Time: If you observe any of these signs, call 911.        Thank you for coming to see [place] at [facility]. I hope we have been able to provide you high quality care today.  You may receive a patient satisfaction survey over the next few weeks. We would appreciate your feedback and comments so that we may continue to improve ourselves and the health of our patients.

## 2021-12-13 NOTE — Progress Notes (Signed)
[facility] [address] [place]. [place] [address] [phone]       OFFICE FOLLOW UP VISIT NOTE  Mr. KAMIL MCHAFFIE Date of Birth:  1933/12/07 Medical Record Number:  [id number]   Referring MD: Krista Blue  Reason for Referral: Second opinion for stroke and TIA   Chief Complaint  Patient presents with   Follow-up    RM 3 with spouse Stanton Kidney Pt is well and stable, no new concerns       HPI:   Update 12/13/2021 JM: Patient returns for 86-month stroke follow-up accompanied by his wife.  Overall doing well without new or reoccurring stroke/TIA symptoms.  Cognition stable.  Compliant on Plavix and Crestor (was changed from atorvastatin to Crestor by PCP). Reports routine lab work by PCP which has been satisfactory (unable to view via epic).  Blood pressure today 136/76.  He asks about natural supplements and remedies to help with his intracranial stenosis. He also voices frustration regarding inability to predict when and if another stroke or TIA may occur. No further concerns at this time.     History provided for reference purposes only Update 05/31/2021 JM: Mr. Tiemann returns for 86-month stroke/TIA follow-up accompanied by his wife.  He has been stable from stroke standpoint without new or reoccurring stroke/TIA symptoms.  He will occasionally experience tension type headaches which resolved without intervention.  Cognition has been stable.  Compliant on Plavix and atorvastatin without associated side effects.  LDL 54 (02/2021).  A1c 7.2 (02/2021). blood pressure today 124/64.  No further concerns at this time.  MR BRAIN 03/07/2021 IMPRESSION: MRI brain (with and without) demonstrating: -Mild atrophy and moderate chronic small vessel ischemic disease. -No acute findings.  MRA head/neck 03/07/2021 IMPRESSION: Right posterior cerebral artery multiple areas of intracranial stenosis proximally and distally.  Left posterior cerebral artery arises mainly from posterior  communicating artery has mild distal stenosis. Normal MRA neck  Update 02/13/2021 Dr. Leonie Man: He returns for follow-up after last visit in July 2021.  He states he had been doing well until February 2022 when he had a TIA.  He was visiting his wife in the hospital was admitted with a stroke.  He had sudden onset of word finding difficulties and blurred vision as well as mild headache.  It lasted only 5 minutes and cleared.  He did not seek medical help at that time.  He remains on Plavix which is tolerating well without bruising or bleeding.  His blood pressures well controlled today it is 123/70.  He remains on Lipitor 10 mg which is tolerating well without muscle aches and pains.  He has not had any recent lipid profile or hemoglobin A1c checked.  He states his memory and cognitive difficulties are unchanged.  They are not getting worse.  He did not take Cerefolin but instead is taking resveratrol to 50 mg daily.  It is not participating regularly in cognitively challenging activities.  He did have EEG on 07/19/2020 which was normal and memory panel labs on 06/06/2020 were also all normal.  Initial visit 06/06/2020 Dr. Leonie Man : Mr. Marsiglia is a 88 year old pleasant Caucasian male who is accompanied today by his wife for initial consultation visit for second opinion for stroke and TIA.  History is obtained from the patient and his wife and review of electronic medical records and I personally reviewed pertinent imaging films in PACS.  He has past medical history of diabetes, hypertension, hyperlipidemia, TIAs and strokes.  He states he has had 5 strokes  over period of a year and a half starting from January 2020.  Initially had an episode of slurred speech, expressive aphasia and headache lasting 10 minutes an MRI scan of the brain in March 2020 showed no acute abnormality but moderate changes of small vessel disease.  MRA showed significant intracranial atherosclerotic disease and carotid ultrasound showed less than 50%  stenosis and echocardiogram showed normal ejection fraction.  He is started on aspirin and Lipitor.  He had recurrent similar episode in early April 2020 with language disturbance with mild headache again lasting only 10 minutes at this time he complained of visual field deficit and he is quite confident that its only in the right visual field of the right eye but not in the left eye.  Plavix 75 mg was added to his aspirin and his LDL cholesterol at that time was 73 mg percent and Lipitor was discontinued.  He underwent external cardiac monitoring for 5 days which showed no evidence of atrial fibrillation.  He subsequently saw Dr. Katy Fitch ophthalmologist in December 2020 for recurrent episodes of visual disturbances which were similar to the one he had experienced in April 2020.  ESR and C-reactive protein were normal.  His most recent episode was in January 2021 when he had sudden onset of right eye visual field deficits in the temporal field with mild headaches as well as some confusion and mild memory difficulties.  This time his aspirin dose was increased to 325 and Plavix 75 mg was continued.  Patient was previously seen by Dr. Metta Clines from [facility] neurology and most recently wanted a second opinion and saw Dr. Evelena Leyden in our office on 04/08/2020 and has been referred to me for my opinion.  MRI scan of the brain on 12/07/2019 shows subacute left posterior cerebral artery infarct involving left occipital pole with moderate changes of small vessel disease.  CT angiogram of the brain on 11/25/2019 showed high-grade P1 and P2 stenosis involving bilateral posterior cerebral arteries with irregularities of bilateral M2 middle cerebral artery branches as well.  CT angiogram of the neck showed no significant extracranial stenosis.  Patient denies any known history of migraine headaches or visual disturbances in the past until last January.  He is tolerating aspirin 325 mg daily and Plavix 75 mg daily with minimum bruising  but no bleeding.  He states his diabetes control has yet quite poor with fasting CBGs ranging from 130-140 range.  His blood pressure seems to be quite well controlled at home though today it is slightly elevated in the office at 146/85.  Patient does complain of mild short-term memory difficulties and often forgets where he keeps his phone.  He feels his memory difficulties are not progressive.  He has been taking a bunch of vitamins as well as resveratrol for his memory.  He denies any history of significant head injury with loss of consciousness, seizures family history of dementia.   ROS:   14 system review of systems is positive for those listed in HPI and all other systems negative  PMH:  Past Medical History:  Diagnosis Date   Arthritis    BPH (benign prostatic hyperplasia)    CVA (cerebral vascular accident) ([place])    Diabetes mellitus    Borderline    DX 2009   Hypercholesterolemia    Spinal stenosis    TIA (transient ischemic attack)    Vertigo    left ear sets it off/ has "crystals" in it    Social History:  Social  History   Socioeconomic History   Marital status: Married    Spouse name: MARY ANN [facility]   Number of children: 3   Years of education: college   Highest education level: Not on file  Occupational History   Occupation: retired    Comment: Retired  Tobacco Use   Smoking status: Former    Types: Cigarettes   Smokeless tobacco: Never   Tobacco comments:    [place].  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     b complex vitamins tablet Take 1 tablet by mouth daily.     Cholecalciferol (VITAMIN D) 2000 units CAPS Take 2,000-4,000 Units by mouth See admin instructions. Take 4000 units in the morning and 2000 units in the evening     Chromium 1 MG CAPS Take 1 capsule by mouth daily.     clopidogrel (PLAVIX) 75 MG tablet TK 1 T PO QD     Coenzyme Q10 (CO Q 10) 100 MG CAPS Take 100 mg by mouth 2 (two) times daily.     DHEA 50 MG TABS Take 50 mg by mouth daily.     GLUCOSAMINE-MSM-HYALURONIC ACD PO Take 1 tablet by mouth 2 (two) times daily.     hydrocortisone 2.5 % cream as needed.     L-Methylfolate-B12-B6-B2 (CEREFOLIN) 04-12-49-5 MG TABS Take 1 tablet by mouth every morning. 90 tablet 3   metFORMIN (GLUCOPHAGE) 500 MG tablet Take 500 mg by mouth daily.      methylPREDNISolone (MEDROL DOSEPAK) 4 MG TBPK tablet as needed.     milk thistle 175 MG tablet Take 175 mg by mouth 2 (two) times daily.     ONETOUCH VERIO test strip FOR USE WHEN CHECKING BLOOD GLUCOSE ONCE A DAY     OVER THE COUNTER MEDICATION Take 1 tablet by mouth 2 (two) times daily. Prostate health supplement     OVER THE COUNTER MEDICATION Take 560 tablets by mouth 2 (two) times daily. [employer] Berry supplement     Resveratrol 250 MG CAPS Take 250 mg by mouth daily.     Selenium 100 MCG CAPS Take 100 mcg by mouth daily.     Specialty Vitamins Products (BRAIN) TABS Take 1 tablet by mouth 2 (two) times daily.     vitamin C (ASCORBIC ACID) 500 MG tablet Take 1,000 mg by mouth 2 (two) times daily.      No current facility-administered medications on file prior to visit.    Allergies:  No Known  Allergies  Physical Exam Today's Vitals   12/13/21 1535  BP: 136/76  Pulse: 63  Weight: 179 lb (81.2 kg)  Height: $Remove'5\' 8"'[id number]$  (1.727 m)   Body mass index is 27.22 kg/m.    General: well developed, well nourished pleasant elderly Caucasian male, seated, in no evident distress Head: head normocephalic and atraumatic.   Neck: supple with no carotid or supraclavicular bruits Cardiovascular: regular rate and rhythm, no murmurs Musculoskeletal: no deformity Skin:  no rash/petichiae Vascular:  Normal pulses all extremities  Neurologic Exam Mental Status: Awake and fully alert. Fluent speech and language. Oriented to place and time. Recent and remote memory intact. Attention span, concentration and fund of knowledge appropriate. Mood and affect appropriate.  Cranial Nerves: Pupils equal, briskly reactive to light. Extraocular movements full without nystagmus. Visual fields full to confrontation. Hearing intact. Facial sensation intact. Face, tongue, palate moves normally and symmetrically.  Motor: Normal bulk and tone. Normal strength in all tested extremity muscles. Sensory.: intact to touch , pinprick , position and vibratory sensation.  Coordination: Rapid alternating movements normal in all extremities. Finger-to-nose and heel-to-shin performed accurately bilaterally. Gait and Station: Arises from chair without difficulty. Stance is normal. Gait demonstrates normal stride length and balance . Able to heel, toe and tandem walk with mild difficulty.  Reflexes: 1+ and symmetric. Toes downgoing.     ASSESSMENT/PLAN: 88 year old Caucasian male with recurrent transient episodes of monocular right temporal field vision loss and vision disturbance with and without transient speech disturbance possibly TIAs.  Left occipital infarct in January 2021 likely due to intracranial atherosclerosis.  Multiple vascular risk factors of intracranial atherosclerosis, hyperlipidemia, diabetes and hypertension.  He  also has memory difficulties due to mild cognitive impairment.  Transient episode of expressive aphasia in February 2022 likely left hemispheric TIA.  He also has mild cognitive impairment which is stable    Continue clopidogrel 75 mg daily and Crestor 10 mg daily for secondary stroke prevention and maintain strict control of hypertension with blood pressure goal below 130/90, diabetes with hemoglobin A1c goal below 7.0% and lipids with LDL cholesterol goal below 70 mg/dL. I also advised the patient to eat a healthy diet with plenty of whole grains, cereals, fruits and vegetables, exercise regularly and maintain ideal body weight .     No further follow ups indicated at this time. Continue to follow with PCP for stroke risk factor management     CC:  Lavone Orn, MD   I spent 24 minutes of face-to-face and non-face-to-face time with patient and wife.  This included previsit chart review, lab review, study review, electronic health record documentation, patient education and discussion regarding history of TIAs and prior stroke, secondary stroke prevention measures and aggressive stroke risk factor management and answered all other questions to patient satisfaction  Frann Rider, [facility]  [facility] [address] [place], [place] [address]  Phone [phone] Fax [phone] Note: This document was prepared with digital dictation and possible smart phrase technology. Any transcriptional errors that result from this process are unintentional.

## 2021-12-19 DIAGNOSIS — E119 Type 2 diabetes mellitus without complications: Secondary | ICD-10-CM | POA: Diagnosis not present

## 2021-12-19 DIAGNOSIS — H02834 Dermatochalasis of left upper eyelid: Secondary | ICD-10-CM | POA: Diagnosis not present

## 2021-12-19 DIAGNOSIS — H2513 Age-related nuclear cataract, bilateral: Secondary | ICD-10-CM | POA: Diagnosis not present

## 2021-12-19 DIAGNOSIS — G458 Other transient cerebral ischemic attacks and related syndromes: Secondary | ICD-10-CM | POA: Diagnosis not present

## 2021-12-19 DIAGNOSIS — H02831 Dermatochalasis of right upper eyelid: Secondary | ICD-10-CM | POA: Diagnosis not present

## 2021-12-19 DIAGNOSIS — H353131 Nonexudative age-related macular degeneration, bilateral, early dry stage: Secondary | ICD-10-CM | POA: Diagnosis not present

## 2021-12-19 DIAGNOSIS — H43813 Vitreous degeneration, bilateral: Secondary | ICD-10-CM | POA: Diagnosis not present

## 2022-01-10 DIAGNOSIS — L812 Freckles: Secondary | ICD-10-CM | POA: Diagnosis not present

## 2022-01-10 DIAGNOSIS — L821 Other seborrheic keratosis: Secondary | ICD-10-CM | POA: Diagnosis not present

## 2022-01-10 DIAGNOSIS — L570 Actinic keratosis: Secondary | ICD-10-CM | POA: Diagnosis not present

## 2022-03-22 DIAGNOSIS — F439 Reaction to severe stress, unspecified: Secondary | ICD-10-CM | POA: Diagnosis not present

## 2022-03-22 DIAGNOSIS — I10 Essential (primary) hypertension: Secondary | ICD-10-CM | POA: Diagnosis not present

## 2022-03-22 DIAGNOSIS — E1169 Type 2 diabetes mellitus with other specified complication: Secondary | ICD-10-CM | POA: Diagnosis not present

## 2022-03-22 DIAGNOSIS — E0869 Diabetes mellitus due to underlying condition with other specified complication: Secondary | ICD-10-CM | POA: Diagnosis not present

## 2022-04-26 IMAGING — DX DG CHEST 2V
2 series · 2 of 2 positions shown · non-contrast
Comparison: 09/19/2020

CLINICAL DATA: Chest pain

EXAM:
CHEST - 2 VIEW

[chest pa]
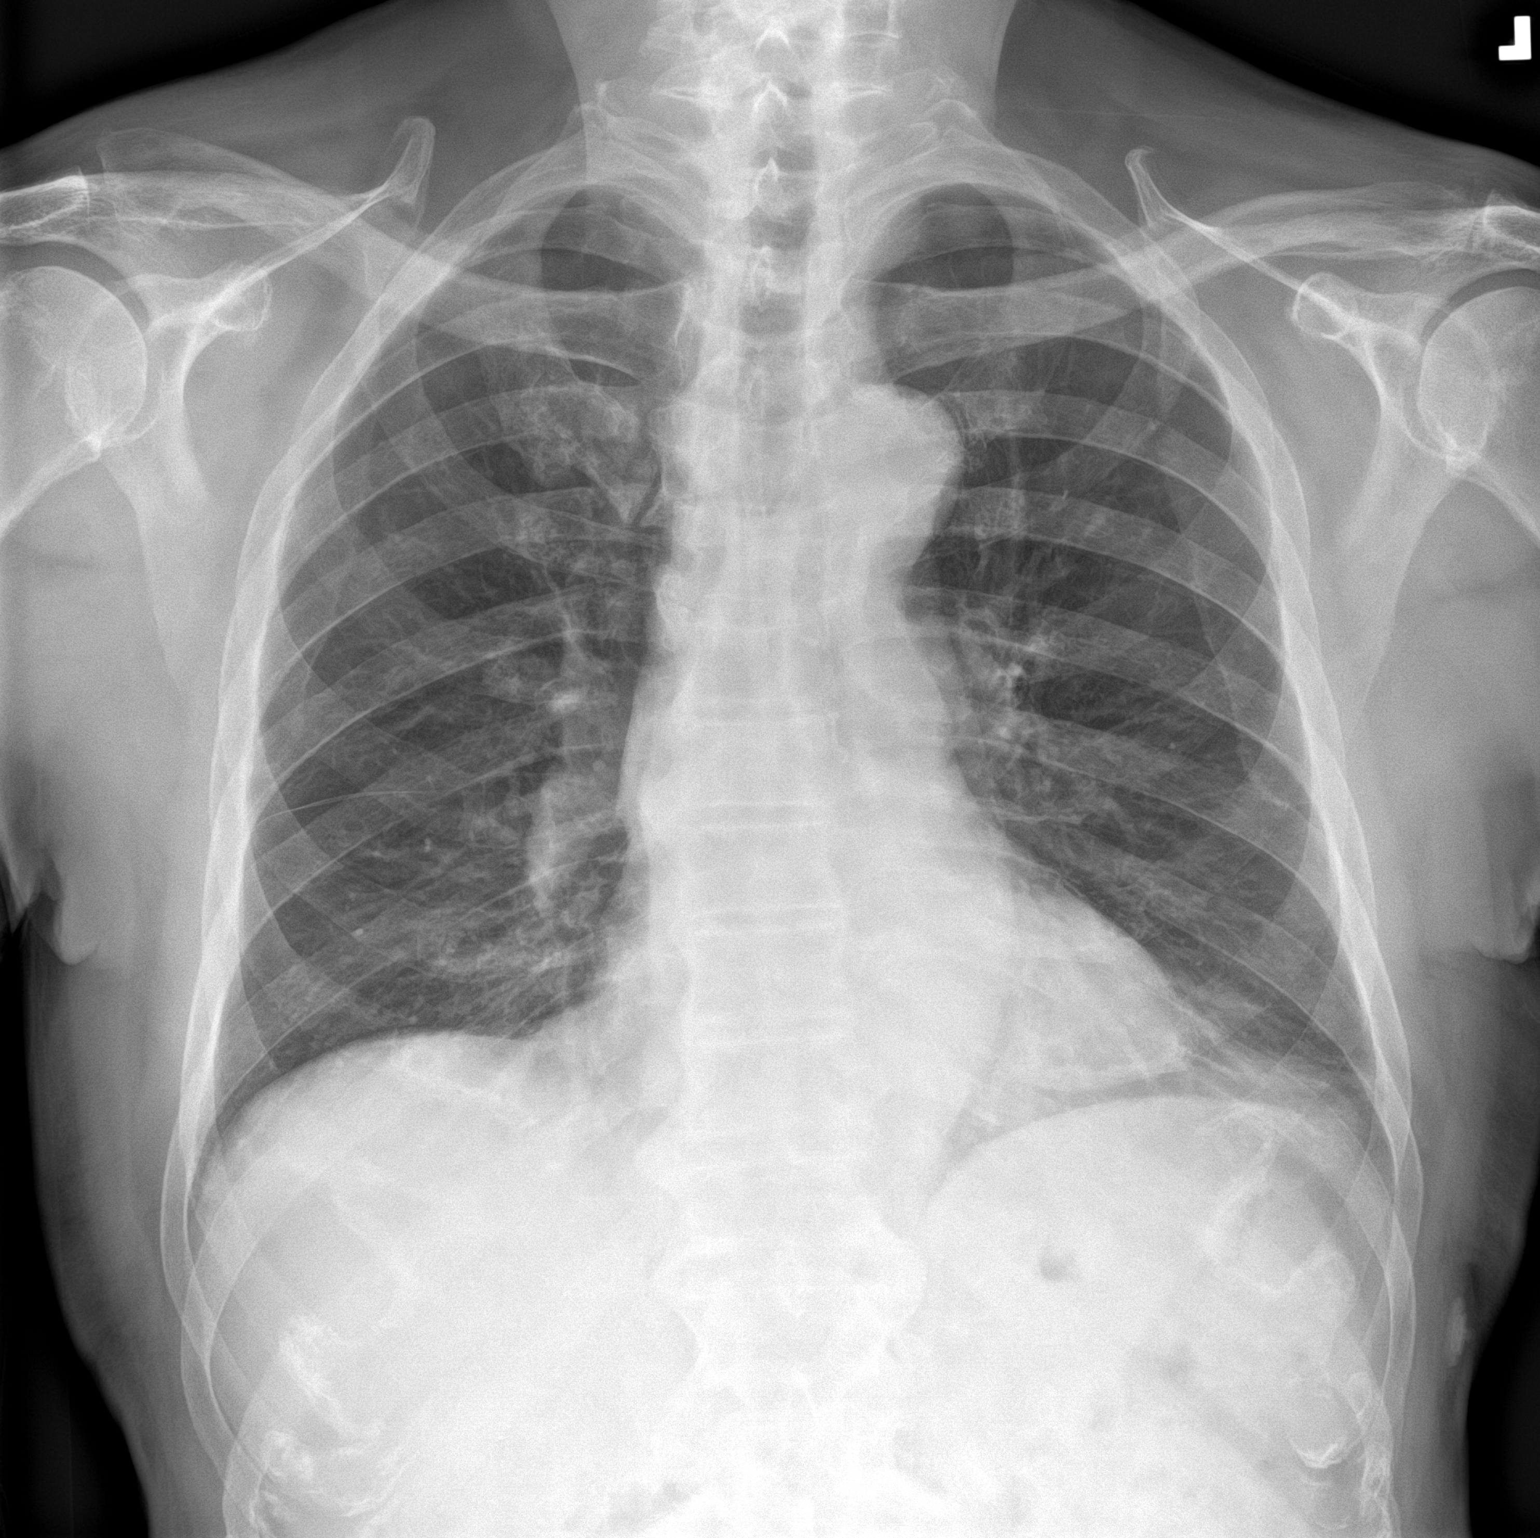

[chest lat]
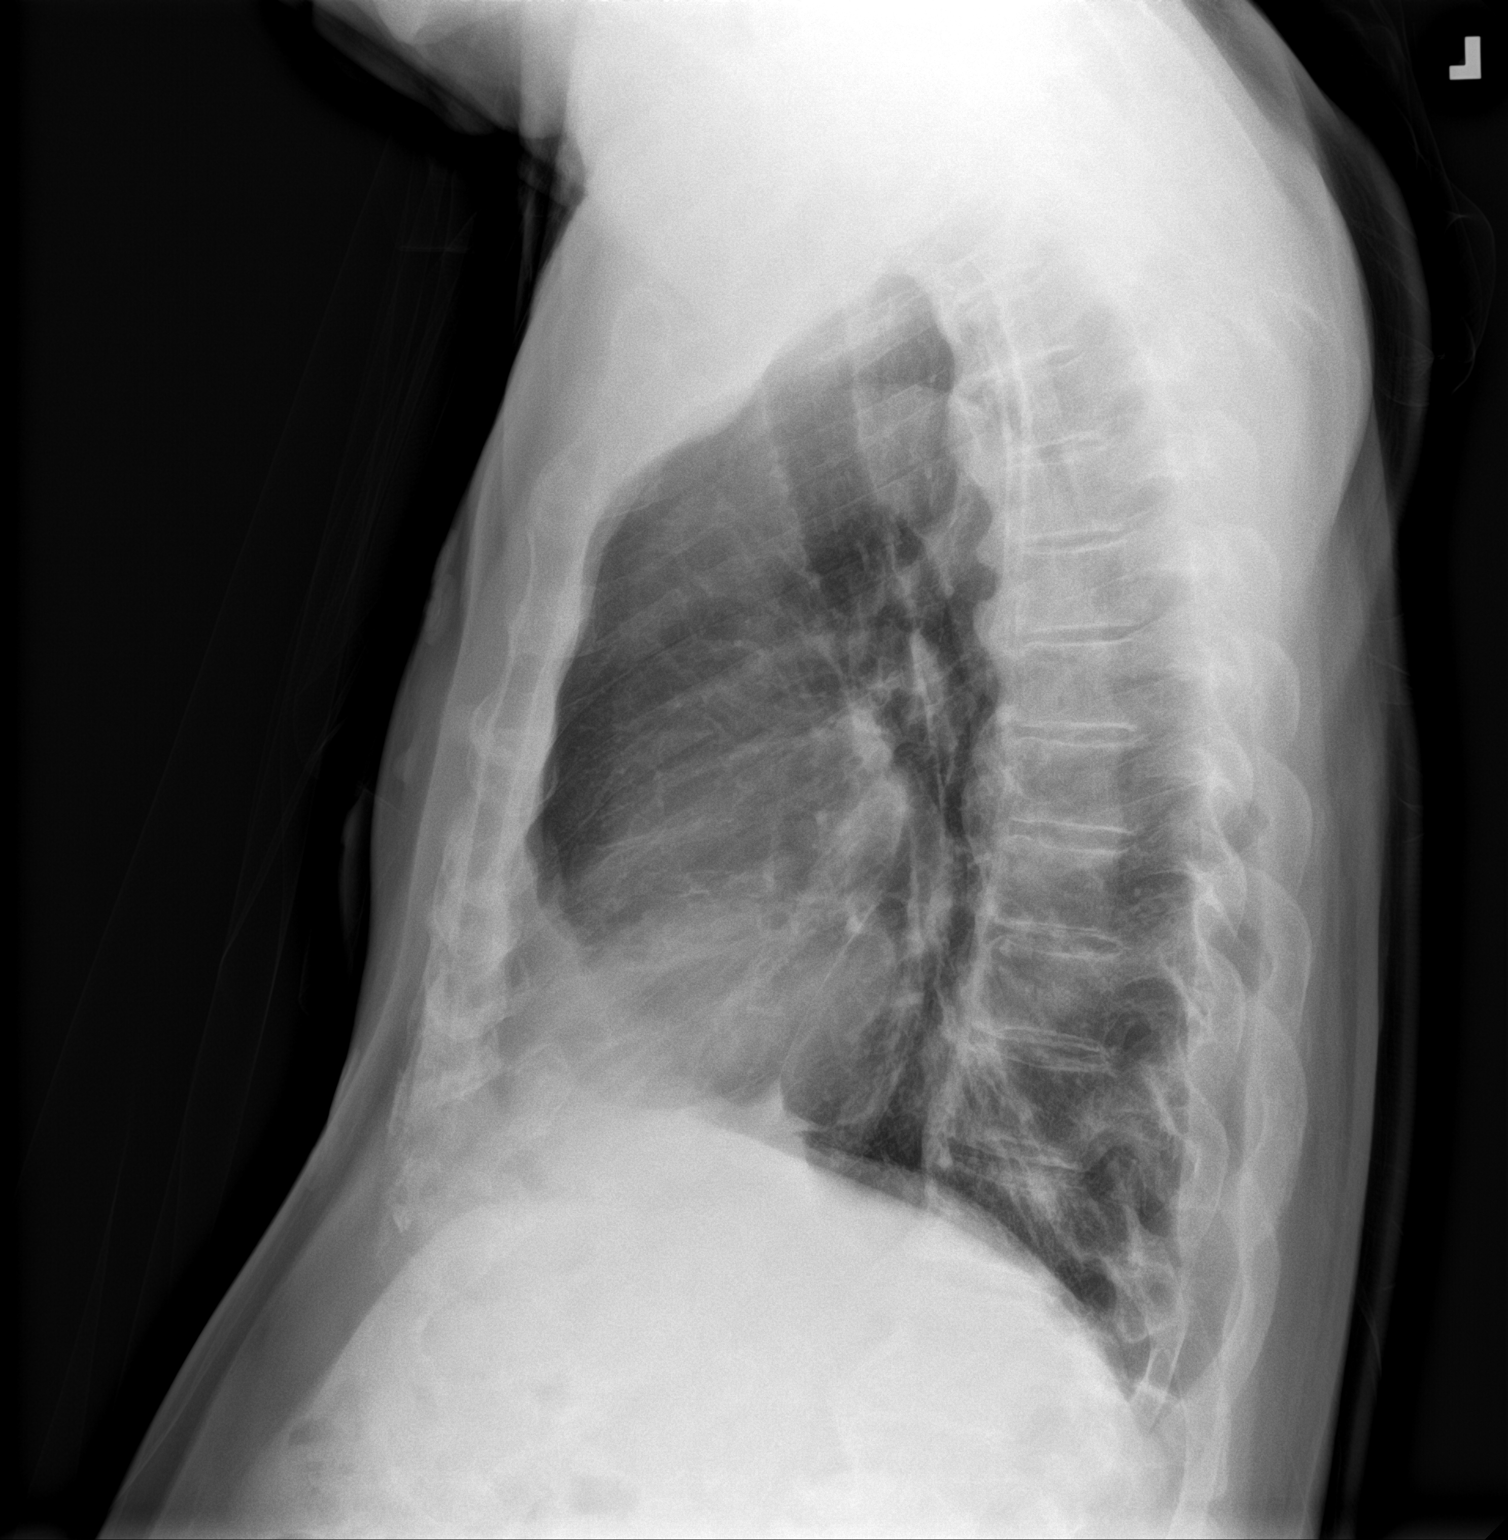

[2 of 2 positions shown; findings below may reference images not displayed]

FINDINGS: The heart size and mediastinal contours are within normal limits.
Both lungs are clear. Disc degenerative disease and ankylosis of the
thoracic spine.
IMPRESSION: No acute abnormality of the lungs.

## 2022-10-08 DIAGNOSIS — N183 Chronic kidney disease, stage 3 unspecified: Secondary | ICD-10-CM | POA: Diagnosis not present

## 2022-10-08 DIAGNOSIS — Z8673 Personal history of transient ischemic attack (TIA), and cerebral infarction without residual deficits: Secondary | ICD-10-CM | POA: Diagnosis not present

## 2022-10-08 DIAGNOSIS — E1122 Type 2 diabetes mellitus with diabetic chronic kidney disease: Secondary | ICD-10-CM | POA: Diagnosis not present

## 2022-10-08 DIAGNOSIS — I679 Cerebrovascular disease, unspecified: Secondary | ICD-10-CM | POA: Diagnosis not present

## 2022-11-01 DIAGNOSIS — R29898 Other symptoms and signs involving the musculoskeletal system: Secondary | ICD-10-CM | POA: Diagnosis not present

## 2022-11-01 DIAGNOSIS — I700 Atherosclerosis of aorta: Secondary | ICD-10-CM | POA: Diagnosis not present

## 2022-11-01 DIAGNOSIS — Z0000 Encounter for general adult medical examination without abnormal findings: Secondary | ICD-10-CM | POA: Diagnosis not present

## 2022-11-01 DIAGNOSIS — I679 Cerebrovascular disease, unspecified: Secondary | ICD-10-CM | POA: Diagnosis not present

## 2022-11-01 DIAGNOSIS — E7800 Pure hypercholesterolemia, unspecified: Secondary | ICD-10-CM | POA: Diagnosis not present

## 2022-11-01 DIAGNOSIS — R0982 Postnasal drip: Secondary | ICD-10-CM | POA: Diagnosis not present

## 2022-11-01 DIAGNOSIS — I10 Essential (primary) hypertension: Secondary | ICD-10-CM | POA: Diagnosis not present

## 2022-11-01 DIAGNOSIS — F439 Reaction to severe stress, unspecified: Secondary | ICD-10-CM | POA: Diagnosis not present

## 2022-11-01 DIAGNOSIS — E1169 Type 2 diabetes mellitus with other specified complication: Secondary | ICD-10-CM | POA: Diagnosis not present

## 2022-11-01 DIAGNOSIS — E0869 Diabetes mellitus due to underlying condition with other specified complication: Secondary | ICD-10-CM | POA: Diagnosis not present

## 2022-11-01 DIAGNOSIS — Z1331 Encounter for screening for depression: Secondary | ICD-10-CM | POA: Diagnosis not present

## 2022-12-14 DIAGNOSIS — M48061 Spinal stenosis, lumbar region without neurogenic claudication: Secondary | ICD-10-CM | POA: Diagnosis not present

## 2022-12-17 DIAGNOSIS — M5416 Radiculopathy, lumbar region: Secondary | ICD-10-CM | POA: Diagnosis not present

## 2022-12-17 DIAGNOSIS — M48062 Spinal stenosis, lumbar region with neurogenic claudication: Secondary | ICD-10-CM | POA: Diagnosis not present

## 2022-12-31 DIAGNOSIS — L308 Other specified dermatitis: Secondary | ICD-10-CM | POA: Diagnosis not present

## 2022-12-31 DIAGNOSIS — L821 Other seborrheic keratosis: Secondary | ICD-10-CM | POA: Diagnosis not present

## 2022-12-31 DIAGNOSIS — L570 Actinic keratosis: Secondary | ICD-10-CM | POA: Diagnosis not present

## 2022-12-31 DIAGNOSIS — L218 Other seborrheic dermatitis: Secondary | ICD-10-CM | POA: Diagnosis not present

## 2023-01-31 DIAGNOSIS — K219 Gastro-esophageal reflux disease without esophagitis: Secondary | ICD-10-CM | POA: Diagnosis not present

## 2023-02-06 DIAGNOSIS — E1169 Type 2 diabetes mellitus with other specified complication: Secondary | ICD-10-CM | POA: Diagnosis not present

## 2023-03-11 DIAGNOSIS — H02834 Dermatochalasis of left upper eyelid: Secondary | ICD-10-CM | POA: Diagnosis not present

## 2023-03-11 DIAGNOSIS — H02831 Dermatochalasis of right upper eyelid: Secondary | ICD-10-CM | POA: Diagnosis not present

## 2023-03-11 DIAGNOSIS — G459 Transient cerebral ischemic attack, unspecified: Secondary | ICD-10-CM | POA: Diagnosis not present

## 2023-03-11 DIAGNOSIS — H2513 Age-related nuclear cataract, bilateral: Secondary | ICD-10-CM | POA: Diagnosis not present

## 2023-03-11 DIAGNOSIS — H353131 Nonexudative age-related macular degeneration, bilateral, early dry stage: Secondary | ICD-10-CM | POA: Diagnosis not present

## 2023-03-11 DIAGNOSIS — H43813 Vitreous degeneration, bilateral: Secondary | ICD-10-CM | POA: Diagnosis not present

## 2023-03-11 DIAGNOSIS — E119 Type 2 diabetes mellitus without complications: Secondary | ICD-10-CM | POA: Diagnosis not present

## 2023-04-09 DIAGNOSIS — E1136 Type 2 diabetes mellitus with diabetic cataract: Secondary | ICD-10-CM | POA: Diagnosis not present

## 2023-04-09 DIAGNOSIS — N1831 Chronic kidney disease, stage 3a: Secondary | ICD-10-CM | POA: Diagnosis not present

## 2023-04-09 DIAGNOSIS — I700 Atherosclerosis of aorta: Secondary | ICD-10-CM | POA: Diagnosis not present

## 2023-04-09 DIAGNOSIS — E1140 Type 2 diabetes mellitus with diabetic neuropathy, unspecified: Secondary | ICD-10-CM | POA: Diagnosis not present

## 2023-04-09 DIAGNOSIS — E1165 Type 2 diabetes mellitus with hyperglycemia: Secondary | ICD-10-CM | POA: Diagnosis not present

## 2023-04-09 DIAGNOSIS — E1122 Type 2 diabetes mellitus with diabetic chronic kidney disease: Secondary | ICD-10-CM | POA: Diagnosis not present

## 2023-04-09 DIAGNOSIS — M19041 Primary osteoarthritis, right hand: Secondary | ICD-10-CM | POA: Diagnosis not present

## 2023-05-08 DIAGNOSIS — E1169 Type 2 diabetes mellitus with other specified complication: Secondary | ICD-10-CM | POA: Diagnosis not present

## 2023-07-23 ENCOUNTER — Ambulatory Visit
Admission: RE | Admit: 2023-07-23 | Discharge: 2023-07-23 | Disposition: A | Discharge status: Home / Self Care | Payer: PPO | Source: Ambulatory Visit | Attending: Internal Medicine | Admitting: Internal Medicine

## 2023-07-23 ENCOUNTER — Other Ambulatory Visit: Payer: Self-pay | Admitting: Internal Medicine

## 2023-07-23 DIAGNOSIS — M25512 Pain in left shoulder: Secondary | ICD-10-CM

## 2023-07-23 DIAGNOSIS — M19012 Primary osteoarthritis, left shoulder: Secondary | ICD-10-CM | POA: Diagnosis not present

## 2023-08-01 DIAGNOSIS — M542 Cervicalgia: Secondary | ICD-10-CM | POA: Insufficient documentation

## 2023-09-06 ENCOUNTER — Encounter: Payer: Self-pay | Admitting: Internal Medicine

## 2023-09-06 ENCOUNTER — Ambulatory Visit (INDEPENDENT_AMBULATORY_CARE_PROVIDER_SITE_OTHER): Payer: PPO | Admitting: Internal Medicine

## 2023-09-06 VITALS — BP 134/86 | HR 68 | Temp 97.8°F | Ht 68.0 in | Wt 174.0 lb

## 2023-09-06 DIAGNOSIS — Z8673 Personal history of transient ischemic attack (TIA), and cerebral infarction without residual deficits: Secondary | ICD-10-CM | POA: Diagnosis not present

## 2023-09-06 DIAGNOSIS — M19049 Primary osteoarthritis, unspecified hand: Secondary | ICD-10-CM

## 2023-09-06 DIAGNOSIS — E118 Type 2 diabetes mellitus with unspecified complications: Secondary | ICD-10-CM

## 2023-09-06 DIAGNOSIS — E785 Hyperlipidemia, unspecified: Secondary | ICD-10-CM | POA: Diagnosis not present

## 2023-09-06 DIAGNOSIS — E1169 Type 2 diabetes mellitus with other specified complication: Secondary | ICD-10-CM | POA: Diagnosis not present

## 2023-09-06 MED ORDER — GLIMEPIRIDE 1 MG PO TABS: 1.0000 mg | ORAL_TABLET | Freq: Every day | ORAL | 1 refills | Status: DC

## 2023-09-06 NOTE — Progress Notes (Signed)
Subjective:   Patient ID: Brian Fitzpatrick, male    DOB: [birth year], 89 y.o.   MRN: [id number]  HPI The patient is a new 89 YO man coming in for questions about health and sugars. See A/P for full details.   PMH, [facility], social history reviewed and updated  Review of Systems  Constitutional: Negative.   HENT: Negative.    Eyes: Negative.   Respiratory:  Positive for cough. Negative for chest tightness and shortness of breath.   Cardiovascular:  Negative for chest pain, palpitations and leg swelling.  Gastrointestinal:  Negative for abdominal distention, abdominal pain, constipation, diarrhea, nausea and vomiting.  Musculoskeletal:  Positive for arthralgias.  Skin: Negative.   Neurological: Negative.   Psychiatric/Behavioral: Negative.      Objective:  Physical Exam Constitutional:      Appearance: He is well-developed.  HENT:     Head: Normocephalic and atraumatic.  Cardiovascular:     Rate and Rhythm: Normal rate and regular rhythm.  Pulmonary:     Effort: Pulmonary effort is normal. No respiratory distress.     Breath sounds: Normal breath sounds. No wheezing or rales.  Abdominal:     General: Bowel sounds are normal. There is no distension.     Palpations: Abdomen is soft.     Tenderness: There is no abdominal tenderness. There is no rebound.  Musculoskeletal:        General: Tenderness present.     Cervical back: Normal range of motion.  Skin:    General: Skin is warm and dry.     Comments: Foot exam done  Neurological:     Mental Status: He is alert and oriented to person, place, and time.     Coordination: Coordination normal.     Vitals:   09/06/23 1027  BP: 134/86  Pulse: 68  Temp: 97.8 F (36.6 C)  TempSrc: Oral  SpO2: 95%  Weight: 174 lb (78.9 kg)  Height: 5\' 8"  (1.727 m)    Assessment & Plan:

## 2023-09-06 NOTE — Assessment & Plan Note (Signed)
He is not taking crestor now and getting recent labs he states checked recently.

## 2023-09-06 NOTE — Patient Instructions (Addendum)
We will check the labs today and plan to send in amaryl (glimepiride) to help the sugars.

## 2023-09-06 NOTE — Assessment & Plan Note (Signed)
He is not on medication now and morning sugar consistently 160s. Rx amaryl 1 mg daily and will obtain labs from prior PCP. Foot exam done. Not on statin or ACE-I/ARB.

## 2023-09-06 NOTE — Assessment & Plan Note (Signed)
Recent in the past few months.

## 2023-09-06 NOTE — Assessment & Plan Note (Signed)
Does have intermittent TIA but no new symptoms in last few years. Not on statin and BP at goal. Has diabetes will get records to assess control.

## 2023-10-07 DIAGNOSIS — H02834 Dermatochalasis of left upper eyelid: Secondary | ICD-10-CM | POA: Diagnosis not present

## 2023-10-07 DIAGNOSIS — H02831 Dermatochalasis of right upper eyelid: Secondary | ICD-10-CM | POA: Diagnosis not present

## 2023-10-07 DIAGNOSIS — H43813 Vitreous degeneration, bilateral: Secondary | ICD-10-CM | POA: Diagnosis not present

## 2023-10-07 DIAGNOSIS — H353131 Nonexudative age-related macular degeneration, bilateral, early dry stage: Secondary | ICD-10-CM | POA: Diagnosis not present

## 2023-10-07 DIAGNOSIS — G459 Transient cerebral ischemic attack, unspecified: Secondary | ICD-10-CM | POA: Diagnosis not present

## 2023-10-07 DIAGNOSIS — E119 Type 2 diabetes mellitus without complications: Secondary | ICD-10-CM | POA: Diagnosis not present

## 2023-10-07 DIAGNOSIS — H2513 Age-related nuclear cataract, bilateral: Secondary | ICD-10-CM | POA: Diagnosis not present

## 2023-10-16 ENCOUNTER — Ambulatory Visit (INDEPENDENT_AMBULATORY_CARE_PROVIDER_SITE_OTHER): Payer: PPO | Admitting: Internal Medicine

## 2023-10-16 ENCOUNTER — Encounter: Payer: Self-pay | Admitting: Internal Medicine

## 2023-10-16 VITALS — BP 124/76 | HR 52 | Temp 98.6°F | Ht 68.0 in | Wt 180.4 lb

## 2023-10-16 DIAGNOSIS — E785 Hyperlipidemia, unspecified: Secondary | ICD-10-CM

## 2023-10-16 DIAGNOSIS — G3184 Mild cognitive impairment, so stated: Secondary | ICD-10-CM | POA: Diagnosis not present

## 2023-10-16 DIAGNOSIS — Z7984 Long term (current) use of oral hypoglycemic drugs: Secondary | ICD-10-CM

## 2023-10-16 DIAGNOSIS — E118 Type 2 diabetes mellitus with unspecified complications: Secondary | ICD-10-CM | POA: Diagnosis not present

## 2023-10-16 DIAGNOSIS — E1169 Type 2 diabetes mellitus with other specified complication: Secondary | ICD-10-CM

## 2023-10-16 LAB — MICROALBUMIN / CREATININE URINE RATIO
Creatinine,U: 116.5 mg/dL
Microalb Creat Ratio: 8.9 mg/g (ref 0.0–30.0)
Microalb, Ur: 10.4 mg/dL — ABNORMAL HIGH (ref 0.0–1.9)

## 2023-10-16 LAB — LIPID PANEL
Cholesterol: 128 mg/dL (ref 0–200)
HDL: 44.8 mg/dL (ref >39.00)
LDL Cholesterol: 49 mg/dL (ref 0–99)
NonHDL: 83.01
Total CHOL/HDL Ratio: 3
Triglycerides: 172 mg/dL — ABNORMAL HIGH (ref 0.0–149.0)
VLDL: 34.4 mg/dL (ref 0.0–40.0)

## 2023-10-16 LAB — COMPREHENSIVE METABOLIC PANEL
ALT: 17 U/L (ref 0–53)
AST: 20 U/L (ref 0–37)
Albumin: 4.4 g/dL (ref 3.5–5.2)
Alkaline Phosphatase: 68 U/L (ref 39–117)
BUN: 25 mg/dL — ABNORMAL HIGH (ref 6–23)
CO2: 30 meq/L (ref 19–32)
Calcium: 10.1 mg/dL (ref 8.4–10.5)
Chloride: 103 meq/L (ref 96–112)
Creatinine, Ser: 1.55 mg/dL — ABNORMAL HIGH (ref 0.40–1.50)
GFR: 39.88 mL/min — ABNORMAL LOW (ref >60.00)
Glucose, Bld: 147 mg/dL — ABNORMAL HIGH (ref 70–99)
Potassium: 4.6 meq/L (ref 3.5–5.1)
Sodium: 139 meq/L (ref 135–145)
Total Bilirubin: 1 mg/dL (ref 0.2–1.2)
Total Protein: 7 g/dL (ref 6.0–8.3)

## 2023-10-16 LAB — CBC
HCT: 44.4 % (ref 39.0–52.0)
Hemoglobin: 14.8 g/dL (ref 13.0–17.0)
MCHC: 33.3 g/dL (ref 30.0–36.0)
MCV: 94.3 fL (ref 78.0–100.0)
Platelets: 254 10*3/uL (ref 150.0–400.0)
RBC: 4.71 Mil/uL (ref 4.22–5.81)
RDW: 13.5 % (ref 11.5–15.5)
WBC: 7.4 10*3/uL (ref 4.0–10.5)

## 2023-10-16 LAB — HEMOGLOBIN A1C: Hgb A1c MFr Bld: 8.1 % — ABNORMAL HIGH (ref 4.6–6.5)

## 2023-10-16 MED ORDER — DONEPEZIL HCL 5 MG PO TABS: 5.0000 mg | ORAL_TABLET | Freq: Every day | ORAL | 0 refills | Status: DC

## 2023-10-16 NOTE — Assessment & Plan Note (Signed)
He is concerned about memory and dementia does run in his family. Some struggles with remembering names and roads. Rx aricept 5 mg daily and follow up 2-3 months and increase if able to 10 mg daily.

## 2023-10-16 NOTE — Patient Instructions (Addendum)
We will check the labs today.  We have sent in the aricept to start taking daily for the memory.

## 2023-10-16 NOTE — Progress Notes (Signed)
   Subjective:   Patient ID: Brian Fitzpatrick, male    DOB: Oct 28, 1934, 89 y.o.   MRN: [id number]  HPI The patient is an 89 YO man coming in for concerns about glimepiride not working. He has increased dose to 1 pill BID on his own last day or two without results. He is having morning sugars he feels are too high. Could not recall exact readings. 1 of 166 which is higher than usual recently. Some episodes in the afternoon of shaking and has to take glucose to feel better. Did not check sugars during episode. Meter broken and unable to show readings during visit but brought it with him. Also wants to discuss preventative medication for memory.   Review of Systems  Constitutional: Negative.   HENT: Negative.    Eyes: Negative.   Respiratory:  Negative for cough, chest tightness and shortness of breath.   Cardiovascular:  Negative for chest pain, palpitations and leg swelling.  Gastrointestinal:  Negative for abdominal distention, abdominal pain, constipation, diarrhea, nausea and vomiting.  Endocrine:       Shakiness episodes respond to glucose administration  Musculoskeletal: Negative.   Skin: Negative.   Neurological: Negative.   Psychiatric/Behavioral: Negative.      Objective:  Physical Exam Constitutional:      Appearance: He is well-developed.  HENT:     Head: Normocephalic and atraumatic.  Cardiovascular:     Rate and Rhythm: Normal rate and regular rhythm.  Pulmonary:     Effort: Pulmonary effort is normal. No respiratory distress.     Breath sounds: Normal breath sounds. No wheezing or rales.  Abdominal:     General: Bowel sounds are normal. There is no distension.     Palpations: Abdomen is soft.     Tenderness: There is no abdominal tenderness. There is no rebound.  Musculoskeletal:     Cervical back: Normal range of motion.  Skin:    General: Skin is warm and dry.  Neurological:     Mental Status: He is alert and oriented to person, place, and time.     Coordination:  Coordination normal.     Vitals:   10/16/23 0950  BP: 124/76  Pulse: (!) 52  Temp: 98.6 F (37 C)  TempSrc: Oral  SpO2: 98%  Weight: 180 lb 6.4 oz (81.8 kg)  Height: 5\' 8"  (1.727 m)    Assessment & Plan:  Visit time 20 minutes in face to face communication with patient and coordination of care, additional 10 minutes spent in record review, coordination or care, ordering tests, communicating/referring to other healthcare professionals, documenting in medical records all on the same day of the visit for total time 30 minutes spent on the visit.

## 2023-10-16 NOTE — Assessment & Plan Note (Signed)
Checking lipid panel and HgA1c and microalbumin to creatinine ratio and CMP. He is taking amaryl 1 mg daily and will wait for results before we change dose. He thinks it is not working but morning sugars sound to be normal (166 is reported as high for him ) and with new episodes of shakiness responding to glucose since he himself increased dose.

## 2023-10-16 NOTE — Assessment & Plan Note (Signed)
Checking lipid panel and adjust as needed. Taking crestor 5 mg daily.

## 2023-10-17 ENCOUNTER — Other Ambulatory Visit: Payer: Self-pay | Admitting: Internal Medicine

## 2023-10-17 MED ORDER — GLIMEPIRIDE 2 MG PO TABS: 2.0000 mg | ORAL_TABLET | Freq: Every day | ORAL | 1 refills | Status: DC

## 2023-11-07 ENCOUNTER — Telehealth: Payer: Self-pay

## 2023-11-07 NOTE — Telephone Encounter (Unsigned)
Copied from CRM [id number]. Topic: Clinical - Medical Advice >> Nov 07, 2023  3:49 PM Larwance Sachs wrote: Reason for CRM: Patient called in regarding diabetes tester breaking and waiting on Dr.Crawford for new one. Patient asked if Ahna or nurse could call back and follow up with him

## 2023-11-11 MED ORDER — ONETOUCH VERIO VI STRP: ORAL_STRIP | 0 refills | Status: AC

## 2023-11-11 NOTE — Addendum Note (Signed)
Addended by: Levonne Lapping on: 11/11/2023 01:25 PM   Modules accepted: Orders

## 2023-11-11 NOTE — Telephone Encounter (Signed)
Sen in new meter

## 2023-12-06 ENCOUNTER — Ambulatory Visit (INDEPENDENT_AMBULATORY_CARE_PROVIDER_SITE_OTHER): Payer: PPO

## 2023-12-06 VITALS — BP 130/70 | HR 69 | Ht 68.0 in | Wt 179.6 lb

## 2023-12-06 DIAGNOSIS — Z0000 Encounter for general adult medical examination without abnormal findings: Secondary | ICD-10-CM

## 2023-12-06 NOTE — Patient Instructions (Signed)
[name] , Thank you for taking time to come for your Medicare Wellness Visit. I appreciate your ongoing commitment to your health goals. Please review the following plan we discussed and let me know if I can assist you in the future.   Referrals/Orders/Follow-Ups/Clinician Recommendations: It was nice to meet you today.  Keep up the good work.   This is a list of the screening recommended for you and due dates:  Health Maintenance  Topic Date Due   Pneumonia Vaccine (1 of 2 - PCV) Never done   Zoster (Shingles) Vaccine (1 of 2) Never done   DTaP/Tdap/Td vaccine (2 - Tdap) 05/23/2016   Eye exam for diabetics  10/20/2020   COVID-19 Vaccine (3 - 2024-25 season) 07/14/2023   Flu Shot  02/10/2024*   Hemoglobin A1C  04/15/2024   Complete foot exam   09/05/2024   Medicare Annual Wellness Visit  12/05/2024   HPV Vaccine  Aged Out  *Topic was postponed. The date shown is not the original due date.    Advanced directives: (In Chart) A copy of your advanced directives are scanned into your chart should your provider ever need it.  Next Medicare Annual Wellness Visit scheduled for next year: Yes

## 2023-12-06 NOTE — Progress Notes (Signed)
Subjective:   Brian Fitzpatrick is a 89 y.o. male who presents for Medicare Annual/Subsequent preventive examination.  Visit Complete: In person   Cardiac Risk Factors include: advanced age (>36men, >72 women);male gender;dyslipidemia;diabetes mellitus     Objective:    Today's Vitals   12/06/23 1139  BP: 130/70  Pulse: 69  SpO2: 100%  Weight: 179 lb 9.6 oz (81.5 kg)  Height: 5\' 8"  (1.727 m)   Body mass index is 27.31 kg/m.     12/06/2023   11:44 AM 01/28/2020   10:49 AM 10/29/2019    3:23 PM 07/30/2019   11:17 AM 02/24/2019   10:37 AM 07/08/2018    5:00 PM 07/01/2018   10:38 AM  Advanced Directives  Does Patient Have a Medical Advance Directive? Yes Yes Yes Yes Yes Yes Yes  Type of Estate agent of [place];Living will   Healthcare Power of [place];Living will  Healthcare Power of Attorney   Does patient want to make changes to medical advance directive? No - Patient declined     No - Patient declined   Copy of Healthcare Power of Attorney in Chart? Yes - validated most recent copy scanned in chart (See row information)     No - copy requested     Current Medications (verified) Outpatient Encounter Medications as of 12/06/2023  Medication Sig   b complex vitamins tablet Take 1 tablet by mouth daily.   Cholecalciferol (VITAMIN D) 2000 units CAPS Take 2,000-4,000 Units by mouth See admin instructions. Take 4000 units in the morning and 2000 units in the evening   clopidogrel (PLAVIX) 75 MG tablet TK 1 T PO QD   donepezil (ARICEPT) 5 MG tablet Take 1 tablet (5 mg total) by mouth at bedtime.   glimepiride (AMARYL) 2 MG tablet Take 1 tablet (2 mg total) by mouth daily with breakfast.   hydrocortisone 2.5 % cream as needed.   L-Methylfolate-B12-B6-B2 (CEREFOLIN) 04-12-49-5 MG TABS Take 1 tablet by mouth every morning.   milk thistle 175 MG tablet Take 175 mg by mouth 2 (two) times daily.   ONETOUCH VERIO test strip Use as instructed   Resveratrol 250 MG CAPS  Take 250 mg by mouth daily.   rosuvastatin (CRESTOR) 5 MG tablet Take 1 tablet by mouth daily.   Selenium 100 MCG CAPS Take 100 mcg by mouth daily.   Specialty Vitamins Products (BRAIN) TABS Take 1 tablet by mouth 2 (two) times daily.   vitamin C (ASCORBIC ACID) 500 MG tablet Take 1,000 mg by mouth 2 (two) times daily.    No facility-administered encounter medications on file as of 12/06/2023.    Allergies (verified) Patient has no known allergies.   History: Past Medical History:  Diagnosis Date   Arthritis    BPH (benign prostatic hyperplasia)    CVA (cerebral vascular accident) (HCC)    Diabetes mellitus    Borderline    DX 2009   Hypercholesterolemia    Spinal stenosis    TIA (transient ischemic attack)    Vertigo    left ear sets it off/ has "crystals" in it   Past Surgical History:  Procedure Laterality Date   BACK SURGERY  06/2018   lumbar decompression, fusion L 2-3-4-5-S1   HERNIA REPAIR     KNEE SURGERY Right    SHOULDER SURGERY Right    Family History  Problem Relation Age of Onset   Colon cancer Sister    Stroke Sister    Diabetes Sister  Colon cancer Brother    Diabetes Brother    Colon cancer Other    Social History   Socioeconomic History   Marital status: Widowed    Spouse name: MARY ANN [facility]   Number of children: 3   Years of education: college   Highest education level: Not on file  Occupational History   Occupation: retired    Comment: Retired  Tobacco Use   Smoking status: Former    Types: Cigarettes   Smokeless tobacco: Never   Tobacco comments:    45 YRS AGO.  Vaping Use   Vaping status: Never Used  Substance and Sexual Activity   Alcohol use: Yes    Alcohol/week: 3.0 standard drinks of alcohol    Types: 3 Glasses of wine per week    Comment: Occasional   Drug use: No   Sexual activity: Not on file  Other Topics Concern   Not on file  Social History Narrative   Lives alone/wife passed this past year   Right Handed    Drinks 1 cups caffeine daily   Social Drivers of Health   Financial Resource Strain: Medium Risk (12/06/2023)   Overall Financial Resource Strain (CARDIA)    Difficulty of Paying Living Expenses: Somewhat hard  Food Insecurity: No Food Insecurity (12/06/2023)   Hunger Vital Sign    Worried About Running Out of Food in the Last Year: Never true    Ran Out of Food in the Last Year: Never true  Transportation Needs: No Transportation Needs (12/06/2023)   PRAPARE - Administrator, Civil Service (Medical): No    Lack of Transportation (Non-Medical): No  Physical Activity: Sufficiently Active (12/06/2023)   Exercise Vital Sign    Days of Exercise per Week: 7 days    Minutes of Exercise per Session: 30 min  Stress: No Stress Concern Present (12/06/2023)   [employer] of Occupational Health - Occupational Stress Questionnaire    Feeling of Stress : Not at all  Social Connections: Moderately Integrated (12/06/2023)   Social Connection and Isolation Panel [NHANES]    Frequency of Communication with Friends and Family: More than three times a week    Frequency of Social Gatherings with Friends and Family: More than three times a week    Attends Religious Services: More than 4 times per year    Active Member of [employer] or Organizations: Yes    Attends Banker Meetings: Never    Marital Status: Widowed    Tobacco Counseling Counseling given: Not Answered Tobacco comments: 45 YRS AGO.   Clinical Intake:  Pre-visit preparation completed: Yes  Pain : No/denies pain     Diabetes: Yes CBG done?: Yes (113) CBG resulted in Enter/ Edit results?: No Did pt. bring in CBG monitor from home?: No  How often do you need to have someone help you when you read instructions, pamphlets, or other written materials from your doctor or pharmacy?: 1 - Never     Information entered by :: Ianmichael Amescua, RMA   Activities of Daily Living    12/06/2023   10:50 AM  In your  present state of health, do you have any difficulty performing the following activities:  Hearing? 1  Vision? 0  Difficulty concentrating or making decisions? 0  Walking or climbing stairs? 0  Dressing or bathing? 0  Doing errands, shopping? 0  Preparing Food and eating ? N  Using the Toilet? N  In the past six months, have you accidently  leaked urine? N  Do you have problems with loss of bowel control? N  Managing your Medications? N  Managing your Finances? N  Housekeeping or managing your Housekeeping? N    Patient Care Team: Myrlene Broker, MD as PCP - General (Internal Medicine) Drema Dallas, DO as Consulting Physician (Neurology) Sallye Lat, MD as Consulting Physician (Ophthalmology)  Indicate any recent Medical Services you may have received from other than [facility] providers in the past year (date may be approximate).     Assessment:   This is a routine wellness examination for [place].  Hearing/Vision screen Hearing Screening - Comments:: Some issues with hearing Vision Screening - Comments:: Wears reading glasses   Goals Addressed               This Visit's Progress     Patient Stated (pt-stated)        To work on feeling better with arthritis.      Depression Screen    12/06/2023   11:48 AM 10/16/2023    9:50 AM 09/06/2023   10:31 AM  PHQ 2/9 Scores  PHQ - 2 Score 3 0 0  PHQ- 9 Score 7      Fall Risk    12/06/2023   11:45 AM 10/16/2023    9:50 AM 09/06/2023   10:31 AM 01/28/2020   10:49 AM 10/29/2019    3:23 PM  Fall Risk   Falls in the past year? 0 0 0 0 0  Number falls in past yr: 0 0 0 0 0  Injury with Fall? 0 0 0 0 0  Risk for fall due to : No Fall Risks No Fall Risks     Follow up Falls prevention discussed;Falls evaluation completed Falls evaluation completed Falls evaluation completed      MEDICARE RISK AT HOME: Medicare Risk at Home Any stairs in or around the home?: Yes Home free of loose throw rugs in walkways, pet  beds, electrical cords, etc?: Yes Adequate lighting in your home to reduce risk of falls?: Yes Life alert?: No Use of a cane, walker or w/c?: No Grab bars in the bathroom?: Yes Shower chair or bench in shower?: Yes Elevated toilet seat or a handicapped toilet?: Yes  TIMED UP AND GO:  Was the test performed?  Yes  Length of time to ambulate 10 feet: 10 sec Gait steady and fast without use of assistive device    Cognitive Function:        12/06/2023   10:51 AM  6CIT Screen  What Year? 0 points  What month? 0 points  What time? 0 points  Count back from 20 0 points  Months in reverse 0 points  Repeat phrase 0 points  Total Score 0 points    Immunizations Immunization History  Administered Date(s) Administered   Hepatitis A, Ped/Adol-2 Dose 05/24/2006   PFIZER(Purple Top)SARS-COV-2 Vaccination 01/14/2020, 02/16/2020   Td 05/23/2006    TDAP status: Due, Education has been provided regarding the importance of this vaccine. Advised may receive this vaccine at local pharmacy or Health Dept. Aware to provide a copy of the vaccination record if obtained from local pharmacy or Health Dept. Verbalized acceptance and understanding.  Flu Vaccine status: Declined, Education has been provided regarding the importance of this vaccine but patient still declined. Advised may receive this vaccine at local pharmacy or Health Dept. Aware to provide a copy of the vaccination record if obtained from local pharmacy or Health Dept. Verbalized acceptance and understanding.  Pneumococcal vaccine status: Declined,  Education has been provided regarding the importance of this vaccine but patient still declined. Advised may receive this vaccine at local pharmacy or Health Dept. Aware to provide a copy of the vaccination record if obtained from local pharmacy or Health Dept. Verbalized acceptance and understanding.   Covid-19 vaccine status: Declined, Education has been provided regarding the importance  of this vaccine but patient still declined. Advised may receive this vaccine at local pharmacy or Health Dept.or vaccine clinic. Aware to provide a copy of the vaccination record if obtained from local pharmacy or Health Dept. Verbalized acceptance and understanding.  Qualifies for Shingles Vaccine? Yes   Zostavax completed No   Shingrix Completed?: No.    Education has been provided regarding the importance of this vaccine. Patient has been advised to call insurance company to determine out of pocket expense if they have not yet received this vaccine. Advised may also receive vaccine at local pharmacy or Health Dept. Verbalized acceptance and understanding.  Screening Tests Health Maintenance  Topic Date Due   Pneumonia Vaccine 103+ Years old (1 of 2 - PCV) Never done   Zoster Vaccines- Shingrix (1 of 2) Never done   DTaP/Tdap/Td (2 - Tdap) 05/23/2016   OPHTHALMOLOGY EXAM  10/20/2020   COVID-19 Vaccine (3 - 2024-25 season) 07/14/2023   INFLUENZA VACCINE  02/10/2024 (Originally 06/13/2023)   HEMOGLOBIN A1C  04/15/2024   FOOT EXAM  09/05/2024   Medicare Annual Wellness (AWV)  12/05/2024   HPV VACCINES  Aged Out    Health Maintenance  Health Maintenance Due  Topic Date Due   Pneumonia Vaccine 58+ Years old (1 of 2 - PCV) Never done   Zoster Vaccines- Shingrix (1 of 2) Never done   DTaP/Tdap/Td (2 - Tdap) 05/23/2016   OPHTHALMOLOGY EXAM  10/20/2020   COVID-19 Vaccine (3 - 2024-25 season) 07/14/2023    Colorectal cancer screening: No longer required.   Lung Cancer Screening: (Low Dose CT Chest recommended if Age 10-80 years, 20 pack-year currently smoking OR have quit w/in 15years.) does not qualify.   Lung Cancer Screening Referral: N/A  Additional Screening:  Hepatitis C Screening: does not qualify;   Vision Screening: Recommended annual ophthalmology exams for early detection of glaucoma and other disorders of the eye. Is the patient up to date with their annual eye exam?  Yes   Who is the provider or what is the name of the office in which the patient attends annual eye exams? Dr. Dione Booze If pt is not established with a provider, would they like to be referred to a provider to establish care? No .   Dental Screening: Recommended annual dental exams for proper oral hygiene  Diabetic Foot Exam: Diabetic Foot Exam: Completed 09/06/2023  Community Resource Referral / Chronic Care Management: CRR required this visit?  No   CCM required this visit?  No     Plan:     I have personally reviewed and noted the following in the patient's chart:   Medical and social history Use of alcohol, tobacco or illicit drugs  Current medications and supplements including opioid prescriptions. Patient is not currently taking opioid prescriptions. Functional ability and status Nutritional status Physical activity Advanced directives List of other physicians Hospitalizations, surgeries, and ER visits in previous 12 months Vitals Screenings to include cognitive, depression, and falls Referrals and appointments  In addition, I have reviewed and discussed with patient certain preventive protocols, quality metrics, and best practice recommendations. A written personalized care  plan for preventive services as well as general preventive health recommendations were provided to patient.     Julia Kulzer L Kerrilyn Azbill, CMA   12/06/2023   After Visit Summary: (In Person-Printed) AVS printed and given to the patient  Nurse Notes: Patient is up to date with all health maintenance.  He did have some concerns about some sinuses that he would like to discuss during his upcoming visit with Dr. Okey Dupre.

## 2023-12-09 ENCOUNTER — Ambulatory Visit: Payer: PPO | Admitting: Internal Medicine

## 2023-12-18 ENCOUNTER — Ambulatory Visit (INDEPENDENT_AMBULATORY_CARE_PROVIDER_SITE_OTHER): Payer: PPO | Admitting: Internal Medicine

## 2023-12-18 ENCOUNTER — Encounter: Payer: Self-pay | Admitting: Internal Medicine

## 2023-12-18 VITALS — BP 140/80 | HR 64 | Temp 97.7°F | Ht 68.0 in | Wt 180.0 lb

## 2023-12-18 DIAGNOSIS — G3184 Mild cognitive impairment, so stated: Secondary | ICD-10-CM

## 2023-12-18 DIAGNOSIS — E118 Type 2 diabetes mellitus with unspecified complications: Secondary | ICD-10-CM

## 2023-12-18 DIAGNOSIS — Z7984 Long term (current) use of oral hypoglycemic drugs: Secondary | ICD-10-CM

## 2023-12-18 LAB — COMPREHENSIVE METABOLIC PANEL
ALT: 18 U/L (ref 0–53)
AST: 20 U/L (ref 0–37)
Albumin: 4.3 g/dL (ref 3.5–5.2)
Alkaline Phosphatase: 59 U/L (ref 39–117)
BUN: 26 mg/dL — ABNORMAL HIGH (ref 6–23)
CO2: 28 meq/L (ref 19–32)
Calcium: 9.5 mg/dL (ref 8.4–10.5)
Chloride: 103 meq/L (ref 96–112)
Creatinine, Ser: 1.46 mg/dL (ref 0.40–1.50)
GFR: 42.8 mL/min — ABNORMAL LOW (ref >60.00)
Glucose, Bld: 109 mg/dL — ABNORMAL HIGH (ref 70–99)
Potassium: 4.2 meq/L (ref 3.5–5.1)
Sodium: 140 meq/L (ref 135–145)
Total Bilirubin: 0.7 mg/dL (ref 0.2–1.2)
Total Protein: 6.8 g/dL (ref 6.0–8.3)

## 2023-12-18 MED ORDER — DONEPEZIL HCL 10 MG PO TABS: 10.0000 mg | ORAL_TABLET | Freq: Every day | ORAL | 3 refills | Status: DC

## 2023-12-18 NOTE — Patient Instructions (Signed)
 We will check the labs today.   We will increase aricept  to 10 mg daily which is the normal dose. It is okay to take 2 pills of aricept  that you have until it is gone. Then fill the new dose.

## 2023-12-18 NOTE — Assessment & Plan Note (Signed)
 Will increase aricept  to 10 mg daily as no symptoms. New rx done and counseled.

## 2023-12-18 NOTE — Progress Notes (Signed)
   Subjective:   Patient ID: Brian Fitzpatrick, male    DOB: Jun 09, 1934, 89 y.o.   MRN: [id number]  HPI The patient is an 89 YO man coming in for follow up sugars and starting aricept . No side effects with aricept  feels some improvement. Sugars running better with 100s morning sugars instead of prior average 150s.   Review of Systems  Constitutional: Negative.   HENT: Negative.    Eyes: Negative.   Respiratory:  Negative for cough, chest tightness and shortness of breath.   Cardiovascular:  Negative for chest pain, palpitations and leg swelling.  Gastrointestinal:  Negative for abdominal distention, abdominal pain, constipation, diarrhea, nausea and vomiting.  Musculoskeletal: Negative.   Skin: Negative.   Neurological: Negative.   Psychiatric/Behavioral: Negative.      Objective:  Physical Exam Constitutional:      Appearance: He is well-developed.  HENT:     Head: Normocephalic and atraumatic.  Cardiovascular:     Rate and Rhythm: Normal rate and regular rhythm.  Pulmonary:     Effort: Pulmonary effort is normal. No respiratory distress.     Breath sounds: Normal breath sounds. No wheezing or rales.  Abdominal:     General: Bowel sounds are normal. There is no distension.     Palpations: Abdomen is soft.     Tenderness: There is no abdominal tenderness. There is no rebound.  Musculoskeletal:     Cervical back: Normal range of motion.  Skin:    General: Skin is warm and dry.  Neurological:     Mental Status: He is alert and oriented to person, place, and time.     Coordination: Coordination normal.     Vitals:   12/18/23 0942 12/18/23 0945  BP: (!) 140/80 (!) 140/80  Pulse: 64   Temp: 97.7 F (36.5 C)   TempSrc: Oral   SpO2: 98%   Weight: 180 lb (81.6 kg)   Height: 5' 8 (1.727 m)     Assessment & Plan:

## 2023-12-18 NOTE — Assessment & Plan Note (Signed)
 Checking fructosamine and CMP today to assess sugar control and adjust as needed amaryl  2 mg daily.

## 2023-12-23 LAB — FRUCTOSAMINE: Fructosamine: 294 umol/L — ABNORMAL HIGH (ref 205–285)

## 2024-01-11 ENCOUNTER — Other Ambulatory Visit: Payer: Self-pay | Admitting: Internal Medicine

## 2024-01-30 ENCOUNTER — Telehealth: Payer: Self-pay | Admitting: Internal Medicine

## 2024-01-30 MED ORDER — LOSARTAN POTASSIUM 25 MG PO TABS: 25.0000 mg | ORAL_TABLET | Freq: Every day | ORAL | 1 refills | Status: DC

## 2024-01-30 NOTE — Telephone Encounter (Signed)
 Called patient to let him know I have reviewed the updated urine ratio and your readings now qualify for treatment which is preventative for kidney disease. His more recent kidney blood tests were stable so this would be preventative. The treatment would be adding a medicine called losartan 1 pill daily. If we started this we would need to follow up with you about 2-4 weeks after starting the medicine to make sure it is working. He will start this and we will schedule f/u appropriately.

## 2024-02-06 ENCOUNTER — Other Ambulatory Visit: Payer: Self-pay

## 2024-02-06 NOTE — Telephone Encounter (Signed)
 Pt has been scheduled.

## 2024-02-08 ENCOUNTER — Other Ambulatory Visit: Payer: Self-pay | Admitting: Internal Medicine

## 2024-02-10 ENCOUNTER — Other Ambulatory Visit: Payer: Self-pay | Admitting: Internal Medicine

## 2024-02-10 ENCOUNTER — Telehealth: Payer: Self-pay | Admitting: Internal Medicine

## 2024-02-10 MED ORDER — ROSUVASTATIN CALCIUM 5 MG PO TABS
5.0000 mg | ORAL_TABLET | Freq: Every day | ORAL | 3 refills | Status: AC
Start: 2024-02-10 — End: ?

## 2024-02-10 NOTE — Telephone Encounter (Signed)
 Copied from CRM [id number]. Topic: Clinical - Medication Question >> Feb 10, 2024 12:30 PM Almira Coaster wrote: Reason for CRM: Patient is calling to follow up on the refill request for clopidogrel (PLAVIX) 75 MG tablet , patient submitted a request through the pharmacy and followed up with them and they have not gotten a response from the office as of yet. He would like for the prescription to be sent in today.

## 2024-02-20 NOTE — Telephone Encounter (Signed)
 This was sent in on 02/11/2024

## 2024-02-24 ENCOUNTER — Encounter: Payer: Self-pay | Admitting: Internal Medicine

## 2024-02-24 ENCOUNTER — Ambulatory Visit (INDEPENDENT_AMBULATORY_CARE_PROVIDER_SITE_OTHER): Admitting: Internal Medicine

## 2024-02-24 VITALS — BP 140/80 | HR 59 | Temp 98.0°F | Ht 68.0 in | Wt 179.0 lb

## 2024-02-24 DIAGNOSIS — R809 Proteinuria, unspecified: Secondary | ICD-10-CM | POA: Diagnosis not present

## 2024-02-24 DIAGNOSIS — E1129 Type 2 diabetes mellitus with other diabetic kidney complication: Secondary | ICD-10-CM

## 2024-02-24 DIAGNOSIS — E118 Type 2 diabetes mellitus with unspecified complications: Secondary | ICD-10-CM | POA: Diagnosis not present

## 2024-02-24 DIAGNOSIS — Z7984 Long term (current) use of oral hypoglycemic drugs: Secondary | ICD-10-CM | POA: Diagnosis not present

## 2024-02-24 LAB — COMPREHENSIVE METABOLIC PANEL WITH GFR
ALT: 18 U/L (ref 0–53)
AST: 20 U/L (ref 0–37)
Albumin: 4.9 g/dL (ref 3.5–5.2)
Alkaline Phosphatase: 79 U/L (ref 39–117)
BUN: 26 mg/dL — ABNORMAL HIGH (ref 6–23)
CO2: 29 meq/L (ref 19–32)
Calcium: 10.3 mg/dL (ref 8.4–10.5)
Chloride: 104 meq/L (ref 96–112)
Creatinine, Ser: 1.46 mg/dL (ref 0.40–1.50)
GFR: 42.74 mL/min — ABNORMAL LOW (ref >60.00)
Glucose, Bld: 66 mg/dL — ABNORMAL LOW (ref 70–99)
Potassium: 4.7 meq/L (ref 3.5–5.1)
Sodium: 140 meq/L (ref 135–145)
Total Bilirubin: 0.7 mg/dL (ref 0.2–1.2)
Total Protein: 7.5 g/dL (ref 6.0–8.3)

## 2024-02-24 LAB — HEMOGLOBIN A1C: Hgb A1c MFr Bld: 7.6 % — ABNORMAL HIGH (ref 4.6–6.5)

## 2024-02-24 NOTE — Patient Instructions (Signed)
 We will check the labs today.

## 2024-02-24 NOTE — Progress Notes (Unsigned)
   Subjective:   Patient ID: Brian Fitzpatrick, male    DOB: 04-Aug-1934, 89 y.o.   MRN: [id number]  HPI The patient is an 89 YO man coming in for starting losartan for renal protection and microalbuminuria. No side effects and doing well overall. No low blood sugars.   Review of Systems  Constitutional: Negative.   HENT: Negative.    Eyes: Negative.   Respiratory:  Negative for cough, chest tightness and shortness of breath.   Cardiovascular:  Negative for chest pain, palpitations and leg swelling.  Gastrointestinal:  Negative for abdominal distention, abdominal pain, constipation, diarrhea, nausea and vomiting.  Musculoskeletal: Negative.   Skin: Negative.   Neurological: Negative.   Psychiatric/Behavioral: Negative.      Objective:  Physical Exam Constitutional:      Appearance: He is well-developed.  HENT:     Head: Normocephalic and atraumatic.  Cardiovascular:     Rate and Rhythm: Normal rate and regular rhythm.  Pulmonary:     Effort: Pulmonary effort is normal. No respiratory distress.     Breath sounds: Normal breath sounds. No wheezing or rales.  Abdominal:     General: Bowel sounds are normal. There is no distension.     Palpations: Abdomen is soft.     Tenderness: There is no abdominal tenderness. There is no rebound.  Musculoskeletal:     Cervical back: Normal range of motion.  Skin:    General: Skin is warm and dry.  Neurological:     Mental Status: He is alert and oriented to person, place, and time.     Coordination: Coordination normal.     Vitals:   02/24/24 1434  BP: (!) 140/80  Pulse: (!) 59  Temp: 98 F (36.7 C)  TempSrc: Oral  SpO2: 97%  Weight: 179 lb (81.2 kg)  Height: 5\' 8"  (1.727 m)    Assessment & Plan:

## 2024-02-25 DIAGNOSIS — E1129 Type 2 diabetes mellitus with other diabetic kidney complication: Secondary | ICD-10-CM | POA: Insufficient documentation

## 2024-02-25 NOTE — Assessment & Plan Note (Signed)
 Checking HgA1c with change to dosing of glimepiride. Adjust as needed. On statin and recently started ARB for microalbuminuria.

## 2024-02-25 NOTE — Assessment & Plan Note (Signed)
 Started on losartan 25 mg daily and checking CMP today to assess. Will need repeat UACR in 6 months or so.

## 2024-02-26 ENCOUNTER — Encounter: Payer: Self-pay | Admitting: Internal Medicine

## 2024-03-04 ENCOUNTER — Ambulatory Visit: Payer: PPO | Admitting: Internal Medicine

## 2024-04-03 ENCOUNTER — Encounter: Payer: Self-pay | Admitting: Internal Medicine

## 2024-04-22 DIAGNOSIS — J300 Vasomotor rhinitis: Secondary | ICD-10-CM | POA: Diagnosis not present

## 2024-04-22 DIAGNOSIS — R0982 Postnasal drip: Secondary | ICD-10-CM | POA: Diagnosis not present

## 2024-04-22 DIAGNOSIS — J342 Deviated nasal septum: Secondary | ICD-10-CM | POA: Diagnosis not present

## 2024-04-22 DIAGNOSIS — H9190 Unspecified hearing loss, unspecified ear: Secondary | ICD-10-CM | POA: Diagnosis not present

## 2024-05-01 ENCOUNTER — Other Ambulatory Visit: Payer: Self-pay | Admitting: Internal Medicine

## 2024-05-13 DIAGNOSIS — H903 Sensorineural hearing loss, bilateral: Secondary | ICD-10-CM | POA: Diagnosis not present

## 2024-05-16 ENCOUNTER — Other Ambulatory Visit: Payer: Self-pay | Admitting: Internal Medicine

## 2024-05-20 DIAGNOSIS — J300 Vasomotor rhinitis: Secondary | ICD-10-CM | POA: Diagnosis not present

## 2024-05-20 DIAGNOSIS — J342 Deviated nasal septum: Secondary | ICD-10-CM | POA: Diagnosis not present

## 2024-05-27 ENCOUNTER — Telehealth: Payer: Self-pay

## 2024-05-27 NOTE — Telephone Encounter (Signed)
 Copied from CRM [id number]. Topic: Clinical - Medication Question >> May 27, 2024  4:06 PM Brian Fitzpatrick wrote: Reason for CRM: Patient is requesting a phone call to discuss being off of plavix due to an upcoming operation that he has to have.

## 2024-05-29 NOTE — Telephone Encounter (Signed)
 Is it ok for patient to come off plavix before procedure

## 2024-06-02 DIAGNOSIS — H2513 Age-related nuclear cataract, bilateral: Secondary | ICD-10-CM | POA: Diagnosis not present

## 2024-06-02 DIAGNOSIS — H353131 Nonexudative age-related macular degeneration, bilateral, early dry stage: Secondary | ICD-10-CM | POA: Diagnosis not present

## 2024-06-02 DIAGNOSIS — E119 Type 2 diabetes mellitus without complications: Secondary | ICD-10-CM | POA: Diagnosis not present

## 2024-06-02 DIAGNOSIS — H43813 Vitreous degeneration, bilateral: Secondary | ICD-10-CM | POA: Diagnosis not present

## 2024-06-02 DIAGNOSIS — H02831 Dermatochalasis of right upper eyelid: Secondary | ICD-10-CM | POA: Diagnosis not present

## 2024-06-02 DIAGNOSIS — H02834 Dermatochalasis of left upper eyelid: Secondary | ICD-10-CM | POA: Diagnosis not present

## 2024-06-03 ENCOUNTER — Encounter: Payer: Self-pay | Admitting: Internal Medicine

## 2024-06-04 DIAGNOSIS — W19XXXA Unspecified fall, initial encounter: Secondary | ICD-10-CM | POA: Diagnosis not present

## 2024-06-04 DIAGNOSIS — R030 Elevated blood-pressure reading, without diagnosis of hypertension: Secondary | ICD-10-CM | POA: Diagnosis not present

## 2024-06-04 DIAGNOSIS — G8911 Acute pain due to trauma: Secondary | ICD-10-CM | POA: Diagnosis not present

## 2024-06-04 DIAGNOSIS — S0990XA Unspecified injury of head, initial encounter: Secondary | ICD-10-CM | POA: Diagnosis not present

## 2024-06-04 DIAGNOSIS — R0781 Pleurodynia: Secondary | ICD-10-CM | POA: Diagnosis not present

## 2024-06-04 DIAGNOSIS — M25512 Pain in left shoulder: Secondary | ICD-10-CM | POA: Diagnosis not present

## 2024-06-08 NOTE — Telephone Encounter (Signed)
 Ok to hold plavix 5 days prior and resume when medically safe after procedure.

## 2024-06-11 ENCOUNTER — Other Ambulatory Visit: Payer: Self-pay | Admitting: Otolaryngology

## 2024-06-15 ENCOUNTER — Encounter: Payer: Self-pay | Admitting: Podiatry

## 2024-06-15 ENCOUNTER — Ambulatory Visit: Admitting: Podiatry

## 2024-06-15 DIAGNOSIS — M79674 Pain in right toe(s): Secondary | ICD-10-CM | POA: Diagnosis not present

## 2024-06-15 DIAGNOSIS — B351 Tinea unguium: Secondary | ICD-10-CM

## 2024-06-15 DIAGNOSIS — L600 Ingrowing nail: Secondary | ICD-10-CM | POA: Diagnosis not present

## 2024-06-15 DIAGNOSIS — M79675 Pain in left toe(s): Secondary | ICD-10-CM

## 2024-06-17 NOTE — Progress Notes (Signed)
 Subjective:   Patient ID: Brian Fitzpatrick, male   DOB: 89 y.o.   MRN: [id number]   HPI Chief Complaint  Patient presents with   Ingrown Toenail    Patient is here for bilateral ingrown nail of hallux nail Left medial and lateral, right medial   89 year old male presents the office with above concerns.  States he has had the ingrown toenails removed a couple times with a keep coming back.  He tried to trim out himself but they keep growing back as well.  Because discomfort as they grow back but no swelling, redness or any drainage.   Review of Systems  All other systems reviewed and are negative.  Past Medical History:  Diagnosis Date   Arthritis    BPH (benign prostatic hyperplasia)    CVA (cerebral vascular accident) (HCC)    Diabetes mellitus    Borderline    DX 2009   Hypercholesterolemia    Spinal stenosis    TIA (transient ischemic attack)    Vertigo    left ear sets it off/ has crystals in it    Past Surgical History:  Procedure Laterality Date   BACK SURGERY  06/2018   lumbar decompression, fusion L 2-3-4-5-S1   HERNIA REPAIR     KNEE SURGERY Right    SHOULDER SURGERY Right      Current Outpatient Medications:    b complex vitamins tablet, Take 1 tablet by mouth daily., Disp: , Rfl:    Cholecalciferol  (VITAMIN D ) 2000 units CAPS, Take 2,000-4,000 Units by mouth See admin instructions. Take 4000 units in the morning and 2000 units in the evening, Disp: , Rfl:    clopidogrel (PLAVIX) 75 MG tablet, TAKE 1 TABLET BY MOUTH EVERY DAY, Disp: 90 tablet, Rfl: 3   donepezil  (ARICEPT ) 10 MG tablet, Take 1 tablet (10 mg total) by mouth at bedtime., Disp: 90 tablet, Rfl: 3   glimepiride  (AMARYL ) 2 MG tablet, TAKE 1 TABLET(2 MG) BY MOUTH DAILY WITH BREAKFAST, Disp: 90 tablet, Rfl: 1   hydrocortisone  2.5 % cream, as needed., Disp: , Rfl:    L-Methylfolate-B12-B6-B2 (CEREFOLIN) 04-12-49-5 MG TABS, Take 1 tablet by mouth every morning., Disp: 90 tablet, Rfl: 3   losartan  (COZAAR )  25 MG tablet, TAKE 1 TABLET(25 MG) BY MOUTH DAILY, Disp: 90 tablet, Rfl: 1   milk thistle 175 MG tablet, Take 175 mg by mouth 2 (two) times daily., Disp: , Rfl:    ONETOUCH VERIO test strip, Use as instructed, Disp: 100 each, Rfl: 0   Resveratrol 250 MG CAPS, Take 250 mg by mouth daily., Disp: , Rfl:    rosuvastatin  (CRESTOR ) 5 MG tablet, Take 1 tablet (5 mg total) by mouth daily., Disp: 90 tablet, Rfl: 3   Selenium  100 MCG CAPS, Take 100 mcg by mouth daily., Disp: , Rfl:    Specialty Vitamins Products (BRAIN) TABS, Take 1 tablet by mouth 2 (two) times daily., Disp: , Rfl:    vitamin C  (ASCORBIC ACID ) 500 MG tablet, Take 1,000 mg by mouth 2 (two) times daily. , Disp: , Rfl:   No Known Allergies        Objective:  Physical Exam  General: AAO x3, NAD  Dermatological: Nails are hypertrophic, dystrophic with yellow discoloration.  Left side worse than the right hallux there is spicules of toenail present along the nail borders and they have been removed and growing back in.  There is no edema, erythema or signs of infection.  No open lesions.  Vascular:  Dorsalis Pedis artery and Posterior Tibial artery pedal pulses are 2/4 bilateral with immedate capillary fill time.  There is no pain with calf compression, swelling, warmth, erythema.   Neruologic: Grossly intact via light touch bilateral.   Musculoskeletal: No other areas of discomfort.      Assessment:   Symptomatic onychomycosis, ingrown toenails     Plan:  -Treatment options discussed including all alternatives, risks, and complications -Etiology of symptoms were discussed - I discussed with them repeat partial nail avulsion with chemical matricectomy.  He is not to proceed with that at this time as they continue to grow back - Sharply debrided nails x 10 without any complications or bleeding.  Return in about 3 months (around 09/15/2024) for nail trim/ingrown toenails.  Brian Fitzpatrick DPM

## 2024-07-01 ENCOUNTER — Ambulatory Visit: Admitting: Internal Medicine

## 2024-07-08 ENCOUNTER — Ambulatory Visit (INDEPENDENT_AMBULATORY_CARE_PROVIDER_SITE_OTHER): Admitting: Internal Medicine

## 2024-07-08 ENCOUNTER — Encounter: Payer: Self-pay | Admitting: Internal Medicine

## 2024-07-08 ENCOUNTER — Other Ambulatory Visit (INDEPENDENT_AMBULATORY_CARE_PROVIDER_SITE_OTHER)

## 2024-07-08 VITALS — BP 140/80 | HR 67 | Temp 97.6°F | Ht 68.0 in | Wt 180.0 lb

## 2024-07-08 DIAGNOSIS — E118 Type 2 diabetes mellitus with unspecified complications: Secondary | ICD-10-CM

## 2024-07-08 DIAGNOSIS — J309 Allergic rhinitis, unspecified: Secondary | ICD-10-CM | POA: Insufficient documentation

## 2024-07-08 DIAGNOSIS — J3089 Other allergic rhinitis: Secondary | ICD-10-CM

## 2024-07-08 DIAGNOSIS — K529 Noninfective gastroenteritis and colitis, unspecified: Secondary | ICD-10-CM

## 2024-07-08 DIAGNOSIS — R252 Cramp and spasm: Secondary | ICD-10-CM

## 2024-07-08 DIAGNOSIS — N400 Enlarged prostate without lower urinary tract symptoms: Secondary | ICD-10-CM | POA: Diagnosis not present

## 2024-07-08 DIAGNOSIS — Z7984 Long term (current) use of oral hypoglycemic drugs: Secondary | ICD-10-CM | POA: Diagnosis not present

## 2024-07-08 DIAGNOSIS — G3184 Mild cognitive impairment, so stated: Secondary | ICD-10-CM | POA: Diagnosis not present

## 2024-07-08 LAB — COMPREHENSIVE METABOLIC PANEL WITH GFR
ALT: 14 U/L (ref 0–53)
AST: 17 U/L (ref 0–37)
Albumin: 4.5 g/dL (ref 3.5–5.2)
Alkaline Phosphatase: 64 U/L (ref 39–117)
BUN: 30 mg/dL — ABNORMAL HIGH (ref 6–23)
CO2: 28 meq/L (ref 19–32)
Calcium: 10.1 mg/dL (ref 8.4–10.5)
Chloride: 100 meq/L (ref 96–112)
Creatinine, Ser: 1.43 mg/dL (ref 0.40–1.50)
GFR: 43.71 mL/min — ABNORMAL LOW (ref >60.00)
Glucose, Bld: 126 mg/dL — ABNORMAL HIGH (ref 70–99)
Potassium: 4.6 meq/L (ref 3.5–5.1)
Sodium: 138 meq/L (ref 135–145)
Total Bilirubin: 0.9 mg/dL (ref 0.2–1.2)
Total Protein: 7 g/dL (ref 6.0–8.3)

## 2024-07-08 LAB — CBC
HCT: 43 % (ref 39.0–52.0)
Hemoglobin: 14.4 g/dL (ref 13.0–17.0)
MCHC: 33.4 g/dL (ref 30.0–36.0)
MCV: 93.3 fl (ref 78.0–100.0)
Platelets: 252 K/uL (ref 150.0–400.0)
RBC: 4.61 Mil/uL (ref 4.22–5.81)
RDW: 13.3 % (ref 11.5–15.5)
WBC: 7.5 K/uL (ref 4.0–10.5)

## 2024-07-08 LAB — POCT GLYCOSYLATED HEMOGLOBIN (HGB A1C): HbA1c POC (<> result, manual entry): 6.6 % (ref 4.0–5.6)

## 2024-07-08 LAB — MAGNESIUM: Magnesium: 2.2 mg/dL (ref 1.5–2.5)

## 2024-07-08 LAB — PSA: PSA: 4.28 ng/mL — ABNORMAL HIGH (ref 0.10–4.00)

## 2024-07-08 NOTE — Patient Instructions (Addendum)
 Your HgA1c is 6.6 today which is great.  We can try stopping donepezil  to see if this is causing the diarrhea.

## 2024-07-08 NOTE — Assessment & Plan Note (Signed)
 We discussed that aricept  may be causing diarrhea and we will trial off 2 weeks. If diarrhea improved will change to namenda.

## 2024-07-08 NOTE — Assessment & Plan Note (Signed)
 POC Hemoglobin A1c done and improved to 6.6% from 7.6% in April, indicating better glycemic control. No recent hypoglycemia, though symptoms occur if fasting for 6-7 hours. Encourage regular meals to prevent hypoglycemia.

## 2024-07-08 NOTE — Assessment & Plan Note (Signed)
 Checking CMP and magnesium to rule out deficiency in calcium , potassium, magnesium. Treat as appropriate.

## 2024-07-08 NOTE — Assessment & Plan Note (Signed)
 Started with some medication changes. Donepezil  (Aricept ) is a potential contributor. Uses Imodium for symptom management. Consider trial discontinuation of donepezil  for 1-2 weeks to assess impact on diarrhea. Discuss alternative memory medications if diarrhea improves after stopping donepezil .

## 2024-07-08 NOTE — Progress Notes (Signed)
 Subjective:   Patient ID: Brian Fitzpatrick, male    DOB: [birth year], 89 y.o.   MRN: [id number]  Discussed the use of AI scribe software for clinical note transcription with the patient, who gave verbal consent to proceed.  History of Present Illness Brian Fitzpatrick is an 89 year old male who presents with concerns about diarrhea and throat discomfort.  He has been experiencing persistent diarrhea since November of the previous year, which began after the passing of his wife and coincided with an increase in his medications from two to five. He uses over-the-counter Imodium to manage the diarrhea, which is particularly inconvenient during walks.  He describes a persistent sensation of a 'tear' in his throat, which he hopes will be resolved with an upcoming nasal procedure. He experiences frequent nasal congestion, requiring him to blow his nose multiple times a day, and keeps tissues readily available in various locations, including his car and airplane. He recalls a recent episode of throat cramping while eating breakfast, attributing it to his susceptibility to cramps. He is interested in checking his magnesium and potassium levels to assess if any imbalances might be contributing to his symptoms.  He has a history of elevated hemoglobin A1c levels, with a recent result of 6.6, down from 7.6 in April. No low blood sugar symptoms unless he goes without eating for six to seven hours. He reports feeling shaky or dizzy if he does not eat for extended periods.  He expresses concern about undergoing anesthesia for an upcoming procedure, particularly about the recovery process, and prefers to have the procedure done in a surgery center for safety reasons. No chest pains, tightness, pressure, heart racing, or stomach troubles aside from diarrhea.  Review of Systems  Constitutional: Negative.   HENT: Negative.    Eyes: Negative.   Respiratory:  Negative for cough, chest tightness and shortness of breath.    Cardiovascular:  Negative for chest pain, palpitations and leg swelling.  Gastrointestinal:  Negative for abdominal distention, abdominal pain, constipation, diarrhea, nausea and vomiting.  Musculoskeletal: Negative.   Skin: Negative.   Neurological: Negative.   Psychiatric/Behavioral: Negative.      Objective:  Physical Exam Constitutional:      Appearance: He is well-developed.  HENT:     Head: Normocephalic and atraumatic.  Cardiovascular:     Rate and Rhythm: Normal rate and regular rhythm.  Pulmonary:     Effort: Pulmonary effort is normal. No respiratory distress.     Breath sounds: Normal breath sounds. No wheezing or rales.  Abdominal:     General: Bowel sounds are normal. There is no distension.     Palpations: Abdomen is soft.     Tenderness: There is no abdominal tenderness. There is no rebound.  Musculoskeletal:     Cervical back: Normal range of motion.  Skin:    General: Skin is warm and dry.  Neurological:     Mental Status: He is alert and oriented to person, place, and time.     Coordination: Coordination normal.    Vitals:   07/08/24 1025  BP: (!) 140/80  Pulse: 67  Temp: 97.6 F (36.4 C)  TempSrc: Oral  SpO2: 98%  Weight: 180 lb (81.6 kg)  Height: 5' 8 (1.727 m)   Assessment and Plan Assessment & Plan Type 2 diabetes mellitus   Hemoglobin A1c improved to 6.6% from 7.6% in April, indicating better glycemic control. No recent hypoglycemia, though symptoms occur if fasting for 6-7 hours. Encourage regular  meals to prevent hypoglycemia.  Chronic diarrhea   Chronic diarrhea since November, possibly related to medication changes. Donepezil  (Aricept ) is a potential contributor. Uses Imodium for symptom management. Consider trial discontinuation of donepezil  for 1-2 weeks to assess impact on diarrhea. Discuss alternative memory medications if diarrhea improves after stopping donepezil .  Chronic nasal congestion and throat irritation   Persistent nasal  congestion and throat irritation, possibly related to underlying nasal issues. Scheduled for a procedure to address nasal concerns, which may alleviate throat symptoms. Prefers procedure at surgery center for safety due to age. Proceed with scheduled nasal procedure at the surgery center for safety. Monitor for improvement in nasal congestion and throat irritation post-procedure.  Muscle cramps   Experiences cramps, including a recent episode while eating. Suspects electrolyte imbalance as a potential cause. Order full blood panel including magnesium and potassium levels to assess for electrolyte imbalances.

## 2024-07-08 NOTE — Assessment & Plan Note (Signed)
 Scheduled for a procedure to address nasal concerns, which may alleviate throat symptoms. Prefers procedure at surgery center for safety due to age. Proceed with scheduled nasal procedure at the surgery center for safety. Monitor for improvement in nasal congestion and throat irritation post-procedure.

## 2024-07-14 ENCOUNTER — Ambulatory Visit: Payer: Self-pay | Admitting: Internal Medicine

## 2024-07-15 ENCOUNTER — Telehealth: Payer: Self-pay | Admitting: Radiology

## 2024-07-15 ENCOUNTER — Telehealth: Payer: Self-pay

## 2024-07-15 NOTE — Telephone Encounter (Signed)
 Copied from CRM #[id number]. Topic: General - Other >> Jul 15, 2024 12:38 PM Paige D wrote: Reason for CRM: michelle from  ENT Sent over a pre surgery form as pt has surgery on 9/9 they have not received a clearance form back yet. Brian Fitzpatrick is going to fax over anther one now and is asking if this can please get completed before pt surgery 9/9

## 2024-07-16 ENCOUNTER — Encounter (HOSPITAL_BASED_OUTPATIENT_CLINIC_OR_DEPARTMENT_OTHER): Payer: Self-pay | Admitting: Otolaryngology

## 2024-07-16 NOTE — Telephone Encounter (Signed)
 This has been placed inside providers box as this was received yesterday

## 2024-07-17 ENCOUNTER — Telehealth: Payer: Self-pay

## 2024-07-17 NOTE — Progress Notes (Signed)
 Called patient and LVM.

## 2024-07-17 NOTE — Telephone Encounter (Signed)
 Forms from [facility] [employer] [facility] [employer] has been fax back successfully

## 2024-07-20 ENCOUNTER — Encounter (HOSPITAL_BASED_OUTPATIENT_CLINIC_OR_DEPARTMENT_OTHER)
Admission: RE | Admit: 2024-07-20 | Discharge: 2024-07-20 | Disposition: A | Discharge status: Home / Self Care | Source: Ambulatory Visit | Attending: Otolaryngology | Admitting: Otolaryngology

## 2024-07-20 DIAGNOSIS — Z01818 Encounter for other preprocedural examination: Secondary | ICD-10-CM | POA: Insufficient documentation

## 2024-07-20 MED ORDER — CHLORHEXIDINE GLUCONATE CLOTH 2 % EX PADS: 6.0000 | MEDICATED_PAD | Freq: Once | CUTANEOUS | Status: DC

## 2024-07-20 NOTE — Telephone Encounter (Signed)
 Forms were sent back closing encounter

## 2024-07-20 NOTE — Progress Notes (Signed)
Surgical soap given with instructions, pt verbalized understanding.  

## 2024-07-21 ENCOUNTER — Encounter (HOSPITAL_BASED_OUTPATIENT_CLINIC_OR_DEPARTMENT_OTHER)
Admission: RE | Disposition: A | Discharge status: Home / Self Care | Payer: Self-pay | Source: Home / Self Care | Attending: Otolaryngology

## 2024-07-21 ENCOUNTER — Encounter (HOSPITAL_BASED_OUTPATIENT_CLINIC_OR_DEPARTMENT_OTHER): Payer: Self-pay | Admitting: Otolaryngology

## 2024-07-21 ENCOUNTER — Ambulatory Visit (HOSPITAL_BASED_OUTPATIENT_CLINIC_OR_DEPARTMENT_OTHER): Admitting: Anesthesiology

## 2024-07-21 ENCOUNTER — Ambulatory Visit (HOSPITAL_BASED_OUTPATIENT_CLINIC_OR_DEPARTMENT_OTHER)
Admission: RE | Admit: 2024-07-21 | Discharge: 2024-07-21 | Disposition: A | Discharge status: Home / Self Care | Attending: Otolaryngology | Admitting: Otolaryngology

## 2024-07-21 ENCOUNTER — Other Ambulatory Visit: Payer: Self-pay

## 2024-07-21 DIAGNOSIS — J3489 Other specified disorders of nose and nasal sinuses: Secondary | ICD-10-CM | POA: Insufficient documentation

## 2024-07-21 DIAGNOSIS — E119 Type 2 diabetes mellitus without complications: Secondary | ICD-10-CM | POA: Insufficient documentation

## 2024-07-21 DIAGNOSIS — J342 Deviated nasal septum: Secondary | ICD-10-CM | POA: Diagnosis not present

## 2024-07-21 DIAGNOSIS — Z7984 Long term (current) use of oral hypoglycemic drugs: Secondary | ICD-10-CM | POA: Diagnosis not present

## 2024-07-21 DIAGNOSIS — J320 Chronic maxillary sinusitis: Secondary | ICD-10-CM | POA: Insufficient documentation

## 2024-07-21 DIAGNOSIS — J324 Chronic pansinusitis: Secondary | ICD-10-CM | POA: Diagnosis not present

## 2024-07-21 DIAGNOSIS — Z87891 Personal history of nicotine dependence: Secondary | ICD-10-CM | POA: Insufficient documentation

## 2024-07-21 DIAGNOSIS — J341 Cyst and mucocele of nose and nasal sinus: Secondary | ICD-10-CM | POA: Insufficient documentation

## 2024-07-21 DIAGNOSIS — Z8673 Personal history of transient ischemic attack (TIA), and cerebral infarction without residual deficits: Secondary | ICD-10-CM | POA: Insufficient documentation

## 2024-07-21 DIAGNOSIS — I679 Cerebrovascular disease, unspecified: Secondary | ICD-10-CM

## 2024-07-21 DIAGNOSIS — E118 Type 2 diabetes mellitus with unspecified complications: Secondary | ICD-10-CM

## 2024-07-21 DIAGNOSIS — I10 Essential (primary) hypertension: Secondary | ICD-10-CM

## 2024-07-21 DIAGNOSIS — J343 Hypertrophy of nasal turbinates: Secondary | ICD-10-CM | POA: Diagnosis not present

## 2024-07-21 HISTORY — PX: MAXILLARY ANTROSTOMY: SHX2003

## 2024-07-21 HISTORY — PX: NASAL SEPTOPLASTY W/ TURBINOPLASTY: SHX2070

## 2024-07-21 LAB — GLUCOSE, CAPILLARY
Glucose-Capillary: 118 mg/dL — ABNORMAL HIGH (ref 70–99)
Glucose-Capillary: 126 mg/dL — ABNORMAL HIGH (ref 70–99)

## 2024-07-21 SURGERY — SEPTOPLASTY, NOSE, WITH NASAL TURBINATE REDUCTION
Anesthesia: General | Site: Nose | Laterality: Right

## 2024-07-21 MED ORDER — PHENYLEPHRINE 80 MCG/ML (10ML) SYRINGE FOR IV PUSH (FOR BLOOD PRESSURE SUPPORT)
PREFILLED_SYRINGE | INTRAVENOUS | Status: AC
  Filled 2024-07-21: qty 10

## 2024-07-21 MED ORDER — DEXAMETHASONE SODIUM PHOSPHATE 10 MG/ML IJ SOLN
INTRAMUSCULAR | Status: AC
Start: 2024-07-21 — End: 2024-07-21
  Filled 2024-07-21: qty 1

## 2024-07-21 MED ORDER — SUGAMMADEX SODIUM 200 MG/2ML IV SOLN
INTRAVENOUS | Status: DC | PRN
  Administered 2024-07-21: 200 mg via INTRAVENOUS

## 2024-07-21 MED ORDER — ONDANSETRON HCL 4 MG/2ML IJ SOLN
INTRAMUSCULAR | Status: AC
  Filled 2024-07-21: qty 2

## 2024-07-21 MED ORDER — EPHEDRINE 5 MG/ML INJ
INTRAVENOUS | Status: AC
  Filled 2024-07-21: qty 5

## 2024-07-21 MED ORDER — OXYCODONE HCL 5 MG PO TABS
5.0000 mg | ORAL_TABLET | Freq: Once | ORAL | Status: AC | PRN
  Administered 2024-07-21: 5 mg via ORAL

## 2024-07-21 MED ORDER — ACETAMINOPHEN 10 MG/ML IV SOLN: 1000.0000 mg | Freq: Once | INTRAVENOUS | Status: DC | PRN

## 2024-07-21 MED ORDER — EPHEDRINE SULFATE-NACL 50-0.9 MG/10ML-% IV SOSY
PREFILLED_SYRINGE | INTRAVENOUS | Status: DC | PRN
  Administered 2024-07-21: 10 mg via INTRAVENOUS

## 2024-07-21 MED ORDER — LIDOCAINE 2% (20 MG/ML) 5 ML SYRINGE
INTRAMUSCULAR | Status: DC | PRN
  Administered 2024-07-21: 100 mg via INTRAVENOUS

## 2024-07-21 MED ORDER — BUPIVACAINE-EPINEPHRINE (PF) 0.25% -1:200000 IJ SOLN
INTRAMUSCULAR | Status: DC | PRN
  Administered 2024-07-21: 8 mL

## 2024-07-21 MED ORDER — MIDAZOLAM HCL 2 MG/2ML IJ SOLN
INTRAMUSCULAR | Status: AC
  Filled 2024-07-21: qty 2

## 2024-07-21 MED ORDER — OXYCODONE HCL 5 MG/5ML PO SOLN: 5.0000 mg | Freq: Once | ORAL | Status: AC | PRN

## 2024-07-21 MED ORDER — BUPIVACAINE-EPINEPHRINE (PF) 0.25% -1:200000 IJ SOLN
INTRAMUSCULAR | Status: AC
  Filled 2024-07-21: qty 30

## 2024-07-21 MED ORDER — CEFAZOLIN SODIUM-DEXTROSE 2-4 GM/100ML-% IV SOLN
INTRAVENOUS | Status: AC
  Filled 2024-07-21: qty 100

## 2024-07-21 MED ORDER — ACETAMINOPHEN 500 MG PO TABS
1000.0000 mg | ORAL_TABLET | Freq: Once | ORAL | Status: AC
  Administered 2024-07-21: 1000 mg via ORAL

## 2024-07-21 MED ORDER — 0.9 % SODIUM CHLORIDE (POUR BTL) OPTIME
TOPICAL | Status: DC | PRN
  Administered 2024-07-21: 120 mL

## 2024-07-21 MED ORDER — OXYCODONE HCL 5 MG PO TABS
ORAL_TABLET | ORAL | Status: AC
  Filled 2024-07-21: qty 1

## 2024-07-21 MED ORDER — LACTATED RINGERS IV SOLN: INTRAVENOUS | Status: DC

## 2024-07-21 MED ORDER — DROPERIDOL 2.5 MG/ML IJ SOLN: 0.6250 mg | Freq: Once | INTRAMUSCULAR | Status: DC | PRN

## 2024-07-21 MED ORDER — DEXAMETHASONE SODIUM PHOSPHATE 4 MG/ML IJ SOLN
INTRAMUSCULAR | Status: DC | PRN
  Administered 2024-07-21: 10 mg via INTRAVENOUS

## 2024-07-21 MED ORDER — PHENYLEPHRINE 80 MCG/ML (10ML) SYRINGE FOR IV PUSH (FOR BLOOD PRESSURE SUPPORT)
PREFILLED_SYRINGE | INTRAVENOUS | Status: DC | PRN
  Administered 2024-07-21 (×3): 80 ug via INTRAVENOUS

## 2024-07-21 MED ORDER — ROCURONIUM BROMIDE 100 MG/10ML IV SOLN
INTRAVENOUS | Status: DC | PRN
  Administered 2024-07-21: 50 mg via INTRAVENOUS

## 2024-07-21 MED ORDER — FENTANYL CITRATE (PF) 100 MCG/2ML IJ SOLN
INTRAMUSCULAR | Status: DC | PRN
  Administered 2024-07-21 (×2): 50 ug via INTRAVENOUS

## 2024-07-21 MED ORDER — ROCURONIUM BROMIDE 10 MG/ML (PF) SYRINGE
PREFILLED_SYRINGE | INTRAVENOUS | Status: AC
  Filled 2024-07-21: qty 10

## 2024-07-21 MED ORDER — ONDANSETRON HCL 4 MG/2ML IJ SOLN
INTRAMUSCULAR | Status: DC | PRN
  Administered 2024-07-21: 4 mg via INTRAVENOUS

## 2024-07-21 MED ORDER — PROPOFOL 10 MG/ML IV BOLUS
INTRAVENOUS | Status: DC | PRN
  Administered 2024-07-21: 120 ug via INTRAVENOUS

## 2024-07-21 MED ORDER — ACETAMINOPHEN 500 MG PO TABS
ORAL_TABLET | ORAL | Status: AC
Start: 2024-07-21 — End: 2024-07-21
  Filled 2024-07-21: qty 2

## 2024-07-21 MED ORDER — OXYMETAZOLINE HCL 0.05 % NA SOLN
NASAL | Status: DC | PRN
  Administered 2024-07-21: 1 via TOPICAL

## 2024-07-21 MED ORDER — FENTANYL CITRATE (PF) 100 MCG/2ML IJ SOLN
INTRAMUSCULAR | Status: AC
  Filled 2024-07-21: qty 2

## 2024-07-21 MED ORDER — FENTANYL CITRATE (PF) 100 MCG/2ML IJ SOLN: 25.0000 ug | INTRAMUSCULAR | Status: DC | PRN

## 2024-07-21 MED ORDER — LIDOCAINE 2% (20 MG/ML) 5 ML SYRINGE
INTRAMUSCULAR | Status: AC
  Filled 2024-07-21: qty 5

## 2024-07-21 MED ORDER — PROPOFOL 10 MG/ML IV BOLUS
INTRAVENOUS | Status: AC
  Filled 2024-07-21: qty 20

## 2024-07-21 MED ORDER — CEFAZOLIN SODIUM-DEXTROSE 2-4 GM/100ML-% IV SOLN
2.0000 g | INTRAVENOUS | Status: AC
  Administered 2024-07-21: 2 g via INTRAVENOUS

## 2024-07-21 SURGICAL SUPPLY — 27 items
BLADE INF TURB ROT M4 2 5PK (BLADE) IMPLANT
BLADE SHAVER TURBINATE 11X2.9 (BLADE) IMPLANT
BLADE SURG 11 STRL SS (BLADE) IMPLANT
CANISTER SUCT 1200ML W/VALVE (MISCELLANEOUS) ×3 IMPLANT
COAGULATOR SUCT 8FR VV (MISCELLANEOUS) IMPLANT
COAGULATOR SUCT SWTCH 10FR 6 (ELECTROSURGICAL) IMPLANT
CORD BIPOLAR FORCEPS 12FT (ELECTRODE) IMPLANT
DRSG TELFA 3X8 NADH STRL (GAUZE/BANDAGES/DRESSINGS) IMPLANT
ELECTRODE REM PT RTRN 9FT ADLT (ELECTROSURGICAL) ×3 IMPLANT
FORCEPS BIPOLAR SPETZLER 8 1.0 (NEUROSURGERY SUPPLIES) IMPLANT
GAUZE SPONGE 2X2 STRL 8-PLY (GAUZE/BANDAGES/DRESSINGS) ×3 IMPLANT
GLOVE BIO SURGEON STRL SZ7 (GLOVE) ×3 IMPLANT
GOWN STRL REUS W/ TWL LRG LVL3 (GOWN DISPOSABLE) ×6 IMPLANT
IV SET EXT 30 76VOL 4 MALE LL (IV SETS) IMPLANT
NDL PRECISIONGLIDE 27X1.5 (NEEDLE) ×3 IMPLANT
NEEDLE PRECISIONGLIDE 27X1.5 (NEEDLE) ×2 IMPLANT
NS IRRIG 1000ML POUR BTL (IV SOLUTION) ×3 IMPLANT
PACK BASIN DAY SURGERY FS (CUSTOM PROCEDURE TRAY) ×3 IMPLANT
PACK ENT DAY SURGERY (CUSTOM PROCEDURE TRAY) ×3 IMPLANT
PATTIES SURGICAL .5 X3 (DISPOSABLE) ×3 IMPLANT
SLEEVE SCD COMPRESS KNEE MED (STOCKING) ×3 IMPLANT
SPLINT NASAL AIRWAY SILICONE (MISCELLANEOUS) IMPLANT
SUT CHROMIC 4 0 RB 1X27 (SUTURE) ×3 IMPLANT
SUT MNCRL AB 4-0 PS2 18 (SUTURE) IMPLANT
SUT PLAIN 4 0 ~~LOC~~ 1 (SUTURE) IMPLANT
TOWEL GREEN STERILE FF (TOWEL DISPOSABLE) ×3 IMPLANT
TUBE SALEM SUMP 16F (TUBING) ×3 IMPLANT

## 2024-07-21 NOTE — Discharge Instructions (Addendum)
 Restart Plavix in 2 days      Post Anesthesia Home Care Instructions  Activity: Get plenty of rest for the remainder of the day. A responsible individual must stay with you for 24 hours following the procedure.  For the next 24 hours, DO NOT: -Drive a car -Advertising copywriter -Drink alcoholic beverages -Take any medication unless instructed by your physician -Make any legal decisions or sign important papers.  Meals: Start with liquid foods such as gelatin or soup. Progress to regular foods as tolerated. Avoid greasy, spicy, heavy foods. If nausea and/or vomiting occur, drink only clear liquids until the nausea and/or vomiting subsides. Call your physician if vomiting continues.  Special Instructions/Symptoms: Your throat may feel dry or sore from the anesthesia or the breathing tube placed in your throat during surgery. If this causes discomfort, gargle with warm salt water. The discomfort should disappear within 24 hours.  If you had a scopolamine patch placed behind your ear for the management of post- operative nausea and/or vomiting:  1. The medication in the patch is effective for 72 hours, after which it should be removed.  Wrap patch in a tissue and discard in the trash. Wash hands thoroughly with soap and water. 2. You may remove the patch earlier than 72 hours if you experience unpleasant side effects which may include dry mouth, dizziness or visual disturbances. 3. Avoid touching the patch. Wash your hands with soap and water after contact with the patch.

## 2024-07-21 NOTE — Anesthesia Procedure Notes (Signed)
 Procedure Name: Intubation Date/Time: 07/21/2024 11:45 AM  Performed by: Buster Catheryn SAUNDERS, CRNAPre-anesthesia Checklist: Patient identified, Emergency Drugs available, Suction available and Patient being monitored Patient Re-evaluated:Patient Re-evaluated prior to induction Oxygen Delivery Method: Circle system utilized Preoxygenation: Pre-oxygenation with 100% oxygen Induction Type: IV induction Ventilation: Mask ventilation without difficulty Laryngoscope Size: Miller and 2 Grade View: Grade II Tube type: Oral Number of attempts: 1 Airway Equipment and Method: Stylet and Oral airway Placement Confirmation: ETT inserted through vocal cords under direct vision, positive ETCO2 and breath sounds checked- equal and bilateral Secured at: 22 cm Tube secured with: Tape Dental Injury: Teeth and Oropharynx as per pre-operative assessment

## 2024-07-21 NOTE — Anesthesia Postprocedure Evaluation (Signed)
 Anesthesia Post Note  Patient: Brian Fitzpatrick  Procedure(s) Performed: SEPTOPLASTY, NOSE, WITH NASAL TURBINATE REDUCTION (Bilateral: Nose) MAXILLARY ANTROSTOMY (Right: Nose)     Patient location during evaluation: PACU Anesthesia Type: General Level of consciousness: awake and alert Pain management: pain level controlled Vital Signs Assessment: post-procedure vital signs reviewed and stable Respiratory status: spontaneous breathing, nonlabored ventilation, respiratory function stable and patient connected to nasal cannula oxygen Cardiovascular status: blood pressure returned to baseline and stable Postop Assessment: no apparent nausea or vomiting Anesthetic complications: no   No notable events documented.  Last Vitals:  Vitals:   07/21/24 1300 07/21/24 1326  BP: (!) 150/88 (!) 165/85  Pulse: 69 70  Resp: (!) 21 16  Temp:  (!) 36.2 C  SpO2: 94% 95%    Last Pain:  Vitals:   07/21/24 1326  TempSrc:   PainSc: 5                  Franky JONETTA Bald

## 2024-07-21 NOTE — Op Note (Signed)
 OPERATIVE NOTE  GRAEME MENEES Date/Time of Admission: 07/21/2024  9:56 AM  CSN: [id number];MRN:[id number] Attending Provider: Sonika Levins, MD Room/Bed: [facility]SP/NONE DOB: [birth year] Age: 89 y.o.   Pre-Op Diagnosis: Deviated nasal septum  Post-Op Diagnosis: Deviated nasal septum  Procedure: Procedure(s): SEPTOPLASTY BILATERAL SUBMUCOSAL RESECTION OF INFERIOR TURBINATES RIGHT MAXILLARY ANTROSTOMY WITH TISSUE REMOVAL  Anesthesia: General  Surgeon(s): Zach Loukas Antonson, MD  Staff: Circulator: Wyn Greig NOVAK, RN; Johnson-Hines, Arland HERO, RN Relief Circulator: Elaine Corlis LABOR, RN Relief Scrub: Florene Dagoberto MATSU, RN Scrub Person: Ethan Render DASEN, RN  Implants: * No implants in log *  Specimens: * No specimens in log *  EBL: 50 ML  Condition: stable  Operative Findings:  Moderate right septal deviation, large mucous retention cyst in right maxillary sinus  Description of Operation: After informed consent was obtained the patient was brought back to the operating room and intubated by anesthesia.  The nose was injected with 1% lidocaine  with 1-100,000 epinephrine  and decongested with Afrin-soaked cottonoids.  A left hemitransfixion incision was made and a mucoperichondrial flap raised.  A vertical incision was made in the septal cartilage and the contralateral septal flap was raised.  Superior and inferior cuts were made along the septum and the posterior bony aspect was fractured with a caudal elevator.  All residual deviated portions of cartilage and bone were removed with a Rudean forcep.  Using 4-0 chromic suture the hemitransfixion incision was closed and a mattressing suture was placed to reduce the septal flaps.  Next a stab incision was made at the head of each of the inferior turbinates and a subperiosteal plane dissected with a caudal elevator.  The bone was fractured laterally.  Then using a microdebrider submucosal tissue was removed from both of the inferior  turbinates.  We then proceeded with the sinus portion of the case and using a 0 degree endoscope the right middle turbinate was gently fractured medially.  The uncinate was taken down with a J-hook and a backbiter.  Next the bulla was reduced and the maxillary sinus then entered with the probe.  This was enlarged using through-cutting instrumentation and a large mucous retention cyst was noted and grasped with a right angle grasping instrument.  Hemostasis was achieved using Afrin-soaked cottonoids and the patient then transferred over to anesthesia in stable condition.  Zach Cloe [facility]

## 2024-07-21 NOTE — Transfer of Care (Signed)
 Immediate Anesthesia Transfer of Care Note  Patient: Brian Fitzpatrick  Procedure(s) Performed: SEPTOPLASTY, NOSE, WITH NASAL TURBINATE REDUCTION (Bilateral: Nose) MAXILLARY ANTROSTOMY (Right: Nose)  Patient Location: PACU  Anesthesia Type:General  Level of Consciousness: awake, alert , and oriented  Airway & Oxygen Therapy: Patient Spontanous Breathing and Patient connected to face mask oxygen  Post-op Assessment: Report given to RN and Post -op Vital signs reviewed and stable  Post vital signs: Reviewed and stable  Last Vitals:  Vitals Value Taken Time  BP 172/98 07/21/24 12:41  Temp    Pulse 71 07/21/24 12:44  Resp 16 07/21/24 12:44  SpO2 99 % 07/21/24 12:44  Vitals shown include unfiled device data.  Last Pain:  Vitals:   07/21/24 1038  TempSrc: Temporal      Patients Stated Pain Goal: 4 (07/21/24 1038)  Complications: No notable events documented.

## 2024-07-21 NOTE — H&P (Signed)
 Brian Fitzpatrick is an 89 y.o. male.    Chief Complaint:  Nasal obstruction  HPI: Patient presents today for planned elective procedure.  He continues to have right sided nasal obstruction and sinus congestion.  He reports that his primary concern is that the sinuses on the right side are not draining and previous imaging was reviewed and did show a right maxillary retention cyst which may correspond with some of his symptoms in addition to the right septal deviation.   Past Medical History:  Diagnosis Date   Arthritis    BPH (benign prostatic hyperplasia)    CVA (cerebral vascular accident) (HCC)    Diabetes mellitus    Borderline    DX 2009   Hypercholesterolemia    Spinal stenosis    TIA (transient ischemic attack)    Vertigo    left ear sets it off/ has crystals in it    Past Surgical History:  Procedure Laterality Date   BACK SURGERY  06/2018   lumbar decompression, fusion L 2-3-4-5-S1   HERNIA REPAIR     KNEE SURGERY Right    SHOULDER SURGERY Right     Family History  Problem Relation Age of Onset   Colon cancer Sister    Stroke Sister    Diabetes Sister    Colon cancer Brother    Diabetes Brother    Colon cancer Other     Social History:  reports that he has quit smoking. His smoking use included cigarettes. He has never used smokeless tobacco. He reports current alcohol use of about 3.0 standard drinks of alcohol per week. He reports that he does not use drugs.  Allergies: No Known Allergies  Medications Prior to Admission  Medication Sig Dispense Refill   b complex vitamins tablet Take 1 tablet by mouth daily.     Cholecalciferol  (VITAMIN D ) 2000 units CAPS Take 2,000-4,000 Units by mouth See admin instructions. Take 4000 units in the morning and 2000 units in the evening     clopidogrel (PLAVIX) 75 MG tablet TAKE 1 TABLET BY MOUTH EVERY DAY 90 tablet 3   donepezil  (ARICEPT ) 10 MG tablet Take 1 tablet (10 mg total) by mouth at bedtime. 90 tablet 3    glimepiride  (AMARYL ) 2 MG tablet TAKE 1 TABLET(2 MG) BY MOUTH DAILY WITH BREAKFAST 90 tablet 1   L-Methylfolate-B12-B6-B2 (CEREFOLIN) 04-12-49-5 MG TABS Take 1 tablet by mouth every morning. 90 tablet 3   losartan  (COZAAR ) 25 MG tablet TAKE 1 TABLET(25 MG) BY MOUTH DAILY 90 tablet 1   Resveratrol 250 MG CAPS Take 250 mg by mouth daily.     rosuvastatin  (CRESTOR ) 5 MG tablet Take 1 tablet (5 mg total) by mouth daily. 90 tablet 3   Specialty Vitamins Products (BRAIN) TABS Take 1 tablet by mouth 2 (two) times daily.     vitamin C  (ASCORBIC ACID ) 500 MG tablet Take 1,000 mg by mouth 2 (two) times daily.      hydrocortisone  2.5 % cream as needed.     milk thistle 175 MG tablet Take 175 mg by mouth 2 (two) times daily.     ONETOUCH VERIO test strip Use as instructed 100 each 0   Selenium  100 MCG CAPS Take 100 mcg by mouth daily.      No results found for this or any previous visit (from the past 48 hours). No results found.  ROS: negative other than stated in HPI  Height 5' 8 (1.727 m), weight 81 kg.  PHYSICAL EXAM:  General: Resting comfortably in NAD Nose: right septal deviation  Lungs: Non-labored respirations  Studies Reviewed: CT and MRI images from 2023   Assessment/Plan  Deviated septum/nasal obstruction - Plan septoplasty and turbinate reduction  Right maxillary sinusitis - Previous imaging was reviewed and did show a nonobstructing right maxillary retention cyst.  He reports that he has had worsening symptoms of obstruction in this area and we discussed the role of limited right-sided endoscopic sinus surgery which he would like to proceed with.  Risks and benefits were discussed and he would like to proceed with surgery.   Zach Conley Pawling MD  07/21/2024, 10:32 AM

## 2024-07-21 NOTE — Anesthesia Preprocedure Evaluation (Addendum)
 Anesthesia Evaluation  Patient identified by MRN, date of birth, ID band Patient awake    Reviewed: Allergy & Precautions, NPO status , Patient's Chart, lab work & pertinent test results  Airway Mallampati: II  TM Distance: >3 FB Neck ROM: Full    Dental  (+) Teeth Intact, Dental Advisory Given   Pulmonary former smoker   breath sounds clear to auscultation       Cardiovascular negative cardio ROS  Rhythm:Regular Rate:Normal     Neuro/Psych TIACVA  negative psych ROS   GI/Hepatic negative GI ROS, Neg liver ROS,,,  Endo/Other  diabetes    Renal/GU Renal disease     Musculoskeletal  (+) Arthritis ,    Abdominal   Peds  Hematology negative hematology ROS (+)   Anesthesia Other Findings   Reproductive/Obstetrics                              Anesthesia Physical Anesthesia Plan  ASA: 3  Anesthesia Plan: General   Post-op Pain Management: Tylenol  PO (pre-op)*   Induction: Intravenous  PONV Risk Score and Plan: 3 and Ondansetron  and Treatment may vary due to age or medical condition  Airway Management Planned: Oral ETT  Additional Equipment: None  Intra-op Plan:   Post-operative Plan: Extubation in OR  Informed Consent: I have reviewed the patients History and Physical, chart, labs and discussed the procedure including the risks, benefits and alternatives for the proposed anesthesia with the patient or authorized representative who has indicated his/her understanding and acceptance.     Dental advisory given  Plan Discussed with: CRNA  Anesthesia Plan Comments:          Anesthesia Quick Evaluation

## 2024-07-22 ENCOUNTER — Encounter (HOSPITAL_BASED_OUTPATIENT_CLINIC_OR_DEPARTMENT_OTHER): Payer: Self-pay | Admitting: Otolaryngology

## 2024-08-10 DIAGNOSIS — J320 Chronic maxillary sinusitis: Secondary | ICD-10-CM | POA: Diagnosis not present

## 2024-08-18 ENCOUNTER — Telehealth: Payer: Self-pay

## 2024-08-18 NOTE — Telephone Encounter (Signed)
 Copied from CRM [id number]. Topic: Clinical - Medication Question >> Aug 18, 2024 11:05 AM Brian Fitzpatrick DEL wrote: Reason for CRM: Patient called in stating that he was advised to discontinue donepezil  (ARICEPT ) 10 MG tablet,to see if his diarrhea stops. And it did,He would like to know which medication will be prescribed now to take the place of this medication?   [id number] [place], [place] - [address] AT [facility] OF [address] & [facility] [address]  Phone: [phone] Fax: [phone]

## 2024-08-18 NOTE — Telephone Encounter (Signed)
**Note De-identified  [place] Obfuscation** Please advise 

## 2024-08-19 MED ORDER — MEMANTINE HCL 10 MG PO TABS: 10.0000 mg | ORAL_TABLET | Freq: Every day | ORAL | 1 refills | Status: DC

## 2024-08-19 NOTE — Addendum Note (Signed)
 Addended by: ROLLENE ALMARIE LABOR on: 08/19/2024 08:45 AM   Modules accepted: Orders

## 2024-08-19 NOTE — Telephone Encounter (Signed)
 Called patient and informed him about the new medication that was sent in for him to take for his Diarrhea pt verbalized she understood

## 2024-08-19 NOTE — Telephone Encounter (Signed)
 We have sent in memantine to take the place 1 pill daily.

## 2024-08-26 ENCOUNTER — Telehealth: Payer: Self-pay

## 2024-08-26 NOTE — Telephone Encounter (Signed)
 Copied from CRM [id number]. Topic: General - Other >> Aug 26, 2024  3:06 PM Eva FALCON wrote: Reason for CRM: Pt is requesting to speak to nurse in regard to changing pharmacies and a medication refill. I let him know I could help, but refused to give me information and requested the nurse. Spoke to CAL and they asked for me to send message. Call back is [phone]

## 2024-09-16 DIAGNOSIS — H903 Sensorineural hearing loss, bilateral: Secondary | ICD-10-CM | POA: Diagnosis not present

## 2024-09-23 ENCOUNTER — Telehealth: Payer: Self-pay

## 2024-09-23 NOTE — Telephone Encounter (Signed)
 Copied from CRM [id number]. Topic: General - Other >> Sep 23, 2024 12:12 PM Paige D wrote: Reason for CRM: Pt calling wanting to speak to miss Michia in regards to an appt with a specialist. Please call pt back

## 2024-09-25 NOTE — Telephone Encounter (Signed)
 Called patient and lvm to return phone call in regards to this message

## 2024-09-28 ENCOUNTER — Encounter: Payer: Self-pay | Admitting: Internal Medicine

## 2024-09-28 DIAGNOSIS — N1831 Chronic kidney disease, stage 3a: Secondary | ICD-10-CM

## 2024-10-14 DIAGNOSIS — M25512 Pain in left shoulder: Secondary | ICD-10-CM | POA: Diagnosis not present

## 2024-10-14 DIAGNOSIS — S46002A Unspecified injury of muscle(s) and tendon(s) of the rotator cuff of left shoulder, initial encounter: Secondary | ICD-10-CM | POA: Diagnosis not present

## 2024-10-14 DIAGNOSIS — G8929 Other chronic pain: Secondary | ICD-10-CM | POA: Diagnosis not present

## 2024-10-14 DIAGNOSIS — M19012 Primary osteoarthritis, left shoulder: Secondary | ICD-10-CM | POA: Diagnosis not present

## 2024-10-15 ENCOUNTER — Other Ambulatory Visit: Payer: Self-pay | Admitting: Internal Medicine

## 2024-10-19 ENCOUNTER — Ambulatory Visit: Admitting: Podiatry

## 2024-10-19 ENCOUNTER — Encounter: Payer: Self-pay | Admitting: Podiatry

## 2024-10-19 DIAGNOSIS — B351 Tinea unguium: Secondary | ICD-10-CM | POA: Diagnosis not present

## 2024-10-19 DIAGNOSIS — Z0189 Encounter for other specified special examinations: Secondary | ICD-10-CM

## 2024-10-19 DIAGNOSIS — M79674 Pain in right toe(s): Secondary | ICD-10-CM | POA: Diagnosis not present

## 2024-10-19 DIAGNOSIS — E119 Type 2 diabetes mellitus without complications: Secondary | ICD-10-CM

## 2024-10-19 DIAGNOSIS — M79675 Pain in left toe(s): Secondary | ICD-10-CM

## 2024-10-19 DIAGNOSIS — E1142 Type 2 diabetes mellitus with diabetic polyneuropathy: Secondary | ICD-10-CM | POA: Diagnosis not present

## 2024-10-19 NOTE — Progress Notes (Signed)
  Subjective:  Patient ID: Brian Fitzpatrick, male    DOB: 02/24/1934,   MRN: [id number]  Chief Complaint  Patient presents with   Diabetes     I have ingrown toenails. Saw Dr. Rollene - 07/08/2024;  A1c - 6.6    89 y.o. male presents for concern of thickened elongated and painful nails that are difficult to trim. Requesting to have them trimmed today. Relates burning and tingling in their feet. Patient is diabetic and last A1c was  Lab Results  Component Value Date   HGBA1C 6.6 07/08/2024   .   PCP:  Rollene Almarie LABOR, MD   3 . Denies any other pedal complaints. Denies n/v/f/c.   Past Medical History:  Diagnosis Date   Arthritis    BPH (benign prostatic hyperplasia)    CVA (cerebral vascular accident) (HCC)    Diabetes mellitus    Borderline    DX 2009   Hypercholesterolemia    Spinal stenosis    TIA (transient ischemic attack)    Vertigo    left ear sets it off/ has crystals in it    Objective:  Physical Exam: Vascular: DP/PT pulses 2/4 bilateral. CFT <3 seconds. Feet cold to touch. Absent hair growth on digits. Edema noted to bilateral lower extremities. Xerosis noted bilaterally.  Skin. No lacerations or abrasions bilateral feet. Nails 1-5 bilateral  are thickened discolored and elongated with subungual debris.  Musculoskeletal: MMT 5/5 bilateral lower extremities in DF, PF, Inversion and Eversion. Deceased ROM in DF of ankle joint.  Neurological: Sensation intact to light touch. Protective sensation diminished bilateral.    Assessment:   1. Pain due to onychomycosis of toenails of both feet   2. Type 2 diabetes mellitus with peripheral neuropathy (HCC)   3. Encounter for diabetic foot exam (HCC)      Plan:  Patient was evaluated and treated and all questions answered. -Discussed and educated patient on diabetic foot care, especially with  regards to the vascular, neurological and musculoskeletal systems.  -Stressed the importance of good glycemic control  and the detriment of not  controlling glucose levels in relation to the foot. -Discussed supportive shoes at all times and checking feet regularly.  -Mechanically debrided all nails 1-5 bilateral using sterile nail nipper and filed with dremel without incident  -Answered all patient questions -Patient to return  in 3 months for at risk foot care -Patient advised to call the office if any problems or questions arise in the meantime.   Brian Fitzpatrick, DPM

## 2024-11-03 ENCOUNTER — Encounter: Payer: Self-pay | Admitting: Internal Medicine

## 2024-11-03 ENCOUNTER — Other Ambulatory Visit: Payer: Self-pay

## 2024-11-03 DIAGNOSIS — E118 Type 2 diabetes mellitus with unspecified complications: Secondary | ICD-10-CM

## 2024-11-03 MED ORDER — GLIMEPIRIDE 2 MG PO TABS: 2.0000 mg | ORAL_TABLET | Freq: Every day | ORAL | 1 refills | Status: AC

## 2024-11-16 ENCOUNTER — Encounter: Payer: Self-pay | Admitting: Internal Medicine

## 2024-11-16 ENCOUNTER — Telehealth: Payer: Self-pay | Admitting: Radiation Oncology

## 2024-11-16 DIAGNOSIS — N1831 Chronic kidney disease, stage 3a: Secondary | ICD-10-CM

## 2024-11-16 DIAGNOSIS — M19049 Primary osteoarthritis, unspecified hand: Secondary | ICD-10-CM

## 2024-11-16 NOTE — Telephone Encounter (Signed)
 1/5 patient called to be seen, no referral received yet.  Patient is aware to follow up with PCP to send referral before schedule.

## 2024-11-17 ENCOUNTER — Other Ambulatory Visit: Payer: Self-pay | Admitting: Internal Medicine

## 2024-12-02 ENCOUNTER — Ambulatory Visit
Admission: RE | Admit: 2024-12-02 | Discharge: 2024-12-02 | Disposition: A | Source: Ambulatory Visit | Attending: Internal Medicine | Admitting: Internal Medicine

## 2024-12-02 DIAGNOSIS — N1831 Chronic kidney disease, stage 3a: Secondary | ICD-10-CM

## 2024-12-07 ENCOUNTER — Ambulatory Visit: Payer: PPO

## 2024-12-14 ENCOUNTER — Ambulatory Visit: Admitting: Internal Medicine

## 2024-12-14 ENCOUNTER — Ambulatory Visit

## 2024-12-14 ENCOUNTER — Telehealth: Payer: Self-pay

## 2024-12-14 ENCOUNTER — Ambulatory Visit: Admitting: Radiation Oncology

## 2024-12-15 ENCOUNTER — Emergency Department (HOSPITAL_COMMUNITY)

## 2024-12-15 ENCOUNTER — Other Ambulatory Visit: Payer: Self-pay

## 2024-12-15 ENCOUNTER — Emergency Department (HOSPITAL_COMMUNITY)
Admission: EM | Admit: 2024-12-15 | Discharge: 2024-12-15 | Disposition: A | Discharge status: Home / Self Care | Attending: Emergency Medicine | Admitting: Emergency Medicine

## 2024-12-15 DIAGNOSIS — E119 Type 2 diabetes mellitus without complications: Secondary | ICD-10-CM | POA: Insufficient documentation

## 2024-12-15 DIAGNOSIS — Z7901 Long term (current) use of anticoagulants: Secondary | ICD-10-CM | POA: Insufficient documentation

## 2024-12-15 DIAGNOSIS — R079 Chest pain, unspecified: Secondary | ICD-10-CM | POA: Insufficient documentation

## 2024-12-15 DIAGNOSIS — I10 Essential (primary) hypertension: Secondary | ICD-10-CM | POA: Insufficient documentation

## 2024-12-15 DIAGNOSIS — Z7984 Long term (current) use of oral hypoglycemic drugs: Secondary | ICD-10-CM | POA: Insufficient documentation

## 2024-12-15 DIAGNOSIS — Z79899 Other long term (current) drug therapy: Secondary | ICD-10-CM | POA: Insufficient documentation

## 2024-12-15 DIAGNOSIS — R1012 Left upper quadrant pain: Secondary | ICD-10-CM | POA: Insufficient documentation

## 2024-12-15 DIAGNOSIS — E871 Hypo-osmolality and hyponatremia: Secondary | ICD-10-CM | POA: Insufficient documentation

## 2024-12-15 LAB — HEPATIC FUNCTION PANEL
ALT: 20 U/L (ref 0–44)
AST: 21 U/L (ref 15–41)
Albumin: 4.1 g/dL (ref 3.5–5.0)
Alkaline Phosphatase: 72 U/L (ref 38–126)
Bilirubin, Direct: 0.2 mg/dL (ref 0.0–0.2)
Indirect Bilirubin: 0.3 mg/dL (ref 0.3–0.9)
Total Bilirubin: 0.4 mg/dL (ref 0.0–1.2)
Total Protein: 6.7 g/dL (ref 6.5–8.1)

## 2024-12-15 LAB — LIPASE, BLOOD: Lipase: 44 U/L (ref 11–51)

## 2024-12-15 LAB — TROPONIN T, HIGH SENSITIVITY
Troponin T High Sensitivity: 27 ng/L — ABNORMAL HIGH (ref 0–19)
Troponin T High Sensitivity: 26 ng/L — ABNORMAL HIGH (ref 0–19)

## 2024-12-15 LAB — BASIC METABOLIC PANEL WITH GFR
Anion gap: 11 (ref 5–15)
BUN: 37 mg/dL — ABNORMAL HIGH (ref 8–23)
CO2: 24 mmol/L (ref 22–32)
Calcium: 9.5 mg/dL (ref 8.9–10.3)
Chloride: 99 mmol/L (ref 98–111)
Creatinine, Ser: 1.36 mg/dL — ABNORMAL HIGH (ref 0.61–1.24)
GFR, Estimated: 50 mL/min — ABNORMAL LOW
Glucose, Bld: 240 mg/dL — ABNORMAL HIGH (ref 70–99)
Potassium: 4.4 mmol/L (ref 3.5–5.1)
Sodium: 134 mmol/L — ABNORMAL LOW (ref 135–145)

## 2024-12-15 LAB — CBC
HCT: 42.7 % (ref 39.0–52.0)
Hemoglobin: 14.1 g/dL (ref 13.0–17.0)
MCH: 30.9 pg (ref 26.0–34.0)
MCHC: 33 g/dL (ref 30.0–36.0)
MCV: 93.4 fL (ref 80.0–100.0)
Platelets: 251 10*3/uL (ref 150–400)
RBC: 4.57 MIL/uL (ref 4.22–5.81)
RDW: 12.7 % (ref 11.5–15.5)
WBC: 7.7 10*3/uL (ref 4.0–10.5)
nRBC: 0 % (ref 0.0–0.2)

## 2024-12-15 NOTE — Discharge Instructions (Signed)
 Thank you for letting us  evaluate you today.  Your cardiac enzymes are flat and do not represent a heart attack or any heart injury.  Your chest x-ray did not show any pneumonia nor fluid.  Your liver enzymes are normal.  Your pancreas enzymes are normal.  Please follow-up with primary care provider for further management  Return to Emergency Department if you experience chest pain, shortness of breath, worsening symptoms

## 2024-12-15 NOTE — ED Notes (Signed)
 Patient A/Ox4. Ambulatory. Discharge paperwork discussed with patient. Discharged in care of self.

## 2024-12-16 LAB — OPHTHALMOLOGY REPORT-SCANNED

## 2024-12-21 ENCOUNTER — Ambulatory Visit: Admitting: Radiation Oncology

## 2024-12-21 ENCOUNTER — Ambulatory Visit

## 2025-01-20 ENCOUNTER — Ambulatory Visit: Admitting: Podiatry

## 2025-06-04 ENCOUNTER — Ambulatory Visit
